# Patient Record
Sex: Female | Born: 1965
Health system: Southern US, Community
[De-identification: ages and names within clinical notes are randomized; demographics above are authoritative.]

## PROBLEM LIST (undated history)

## (undated) DIAGNOSIS — M1990 Unspecified osteoarthritis, unspecified site: Secondary | ICD-10-CM

## (undated) DIAGNOSIS — I2510 Atherosclerotic heart disease of native coronary artery without angina pectoris: Secondary | ICD-10-CM

## (undated) DIAGNOSIS — I219 Acute myocardial infarction, unspecified: Secondary | ICD-10-CM

## (undated) DIAGNOSIS — W19XXXA Unspecified fall, initial encounter: Secondary | ICD-10-CM

## (undated) DIAGNOSIS — K219 Gastro-esophageal reflux disease without esophagitis: Secondary | ICD-10-CM

## (undated) DIAGNOSIS — D1720 Benign lipomatous neoplasm of skin and subcutaneous tissue of unspecified limb: Secondary | ICD-10-CM

## (undated) DIAGNOSIS — R768 Other specified abnormal immunological findings in serum: Secondary | ICD-10-CM

## (undated) DIAGNOSIS — I10 Essential (primary) hypertension: Secondary | ICD-10-CM

## (undated) DIAGNOSIS — M797 Fibromyalgia: Secondary | ICD-10-CM

## (undated) HISTORY — PX: CARPAL TUNNEL RELEASE: SHX101

## (undated) HISTORY — DX: Other specified abnormal immunological findings in serum: R76.8

## (undated) HISTORY — DX: Unspecified fall, initial encounter: W19.XXXA

## (undated) HISTORY — PX: BREAST BIOPSY: SHX20

## (undated) HISTORY — PX: COLONOSCOPY: SHX174

## (undated) HISTORY — DX: Acute myocardial infarction, unspecified: I21.9

## (undated) HISTORY — DX: Fibromyalgia: M79.7

## (undated) HISTORY — PX: FOOT SURGERY: SHX648

## (undated) HISTORY — PX: ANKLE FRACTURE SURGERY: SHX122

## (undated) HISTORY — DX: Atherosclerotic heart disease of native coronary artery without angina pectoris: I25.10

---

## 1993-07-01 HISTORY — PX: SPINE SURGERY: SHX786

## 2002-03-09 ENCOUNTER — Encounter: Payer: Self-pay | Admitting: Internal Medicine

## 2003-06-03 ENCOUNTER — Other Ambulatory Visit: Admission: RE | Admit: 2003-06-03 | Discharge: 2003-06-03 | Payer: Self-pay | Admitting: Internal Medicine

## 2003-08-05 ENCOUNTER — Encounter: Admission: RE | Admit: 2003-08-05 | Discharge: 2003-08-05 | Payer: Self-pay | Admitting: Internal Medicine

## 2004-05-10 ENCOUNTER — Encounter: Payer: Self-pay | Admitting: Internal Medicine

## 2004-05-31 ENCOUNTER — Encounter: Payer: Self-pay | Admitting: Internal Medicine

## 2004-06-11 ENCOUNTER — Ambulatory Visit: Payer: Self-pay | Admitting: Internal Medicine

## 2004-07-30 ENCOUNTER — Ambulatory Visit: Payer: Self-pay | Admitting: Internal Medicine

## 2004-08-20 ENCOUNTER — Encounter: Admission: RE | Admit: 2004-08-20 | Discharge: 2004-08-20 | Payer: Self-pay | Admitting: Internal Medicine

## 2004-08-30 ENCOUNTER — Encounter: Admission: RE | Admit: 2004-08-30 | Discharge: 2004-08-30 | Payer: Self-pay | Admitting: Internal Medicine

## 2005-02-01 ENCOUNTER — Ambulatory Visit: Payer: Self-pay | Admitting: Internal Medicine

## 2005-02-01 ENCOUNTER — Other Ambulatory Visit: Admission: RE | Admit: 2005-02-01 | Discharge: 2005-02-01 | Payer: Self-pay | Admitting: Internal Medicine

## 2005-04-23 ENCOUNTER — Ambulatory Visit: Payer: Self-pay | Admitting: Internal Medicine

## 2005-04-29 ENCOUNTER — Ambulatory Visit: Payer: Self-pay | Admitting: Internal Medicine

## 2005-08-26 ENCOUNTER — Encounter: Admission: RE | Admit: 2005-08-26 | Discharge: 2005-08-26 | Payer: Self-pay | Admitting: Internal Medicine

## 2005-10-24 ENCOUNTER — Ambulatory Visit: Payer: Self-pay | Admitting: Internal Medicine

## 2006-04-21 ENCOUNTER — Ambulatory Visit: Payer: Self-pay | Admitting: Internal Medicine

## 2006-05-12 ENCOUNTER — Ambulatory Visit: Payer: Self-pay | Admitting: Internal Medicine

## 2006-06-16 ENCOUNTER — Ambulatory Visit: Payer: Self-pay | Admitting: Internal Medicine

## 2006-08-28 ENCOUNTER — Encounter: Admission: RE | Admit: 2006-08-28 | Discharge: 2006-08-28 | Payer: Self-pay | Admitting: Internal Medicine

## 2006-09-05 ENCOUNTER — Ambulatory Visit: Payer: Self-pay | Admitting: Internal Medicine

## 2006-09-05 ENCOUNTER — Encounter: Payer: Self-pay | Admitting: Internal Medicine

## 2006-09-05 ENCOUNTER — Other Ambulatory Visit: Admission: RE | Admit: 2006-09-05 | Discharge: 2006-09-05 | Payer: Self-pay | Admitting: Internal Medicine

## 2006-09-05 LAB — CONVERTED CEMR LAB
ALT: 18 units/L (ref 0–40)
AST: 20 units/L (ref 0–37)
Albumin: 3.9 g/dL (ref 3.5–5.2)
Alkaline Phosphatase: 51 units/L (ref 39–117)
BUN: 9 mg/dL (ref 6–23)
Basophils Absolute: 0 10*3/uL (ref 0.0–0.1)
Bilirubin, Direct: 0.1 mg/dL (ref 0.0–0.3)
Cholesterol: 165 mg/dL (ref 0–200)
Creatinine, Ser: 0.7 mg/dL (ref 0.4–1.2)
Eosinophils Absolute: 0.2 10*3/uL (ref 0.0–0.6)
Eosinophils Relative: 3.5 % (ref 0.0–5.0)
HDL: 68.3 mg/dL (ref >39.0)
LDL Cholesterol: 90 mg/dL (ref 0–99)
Lymphocytes Relative: 22.5 % (ref 12.0–46.0)
MCHC: 34.7 g/dL (ref 30.0–36.0)
Monocytes Absolute: 0.3 10*3/uL (ref 0.2–0.7)
Neutro Abs: 3.7 10*3/uL (ref 1.4–7.7)
Neutrophils Relative %: 66.9 % (ref 43.0–77.0)
Potassium: 4.1 meq/L (ref 3.5–5.1)
RDW: 12.1 % (ref 11.5–14.6)
TSH: 1.5 microintl units/mL (ref 0.35–5.50)
Total Bilirubin: 0.8 mg/dL (ref 0.3–1.2)
Total CHOL/HDL Ratio: 2.4
Total Protein: 6.8 g/dL (ref 6.0–8.3)
Triglycerides: 34 mg/dL (ref 0–149)
VLDL: 7 mg/dL (ref 0–40)

## 2006-09-09 ENCOUNTER — Ambulatory Visit: Payer: Self-pay | Admitting: Internal Medicine

## 2006-11-17 ENCOUNTER — Ambulatory Visit: Payer: Self-pay | Admitting: Internal Medicine

## 2006-12-08 ENCOUNTER — Ambulatory Visit: Payer: Self-pay | Admitting: Internal Medicine

## 2007-01-21 ENCOUNTER — Ambulatory Visit: Payer: Self-pay | Admitting: Internal Medicine

## 2007-01-21 DIAGNOSIS — T50995A Adverse effect of other drugs, medicaments and biological substances, initial encounter: Secondary | ICD-10-CM | POA: Insufficient documentation

## 2007-01-22 ENCOUNTER — Telehealth: Payer: Self-pay | Admitting: Internal Medicine

## 2007-01-23 ENCOUNTER — Encounter: Payer: Self-pay | Admitting: Internal Medicine

## 2007-01-30 ENCOUNTER — Telehealth: Payer: Self-pay | Admitting: Internal Medicine

## 2007-02-16 ENCOUNTER — Encounter: Payer: Self-pay | Admitting: Internal Medicine

## 2007-02-18 ENCOUNTER — Ambulatory Visit: Payer: Self-pay | Admitting: Internal Medicine

## 2007-02-18 DIAGNOSIS — M2550 Pain in unspecified joint: Secondary | ICD-10-CM

## 2007-02-18 DIAGNOSIS — IMO0001 Reserved for inherently not codable concepts without codable children: Secondary | ICD-10-CM

## 2007-02-19 ENCOUNTER — Emergency Department (HOSPITAL_COMMUNITY): Admission: EM | Admit: 2007-02-19 | Discharge: 2007-02-19 | Payer: Self-pay | Admitting: Emergency Medicine

## 2007-02-19 ENCOUNTER — Telehealth: Payer: Self-pay | Admitting: Internal Medicine

## 2007-02-20 ENCOUNTER — Telehealth: Payer: Self-pay | Admitting: Internal Medicine

## 2007-02-24 ENCOUNTER — Ambulatory Visit: Payer: Self-pay | Admitting: Internal Medicine

## 2007-02-24 DIAGNOSIS — R51 Headache: Secondary | ICD-10-CM

## 2007-02-24 DIAGNOSIS — I10 Essential (primary) hypertension: Secondary | ICD-10-CM

## 2007-02-24 DIAGNOSIS — R519 Headache, unspecified: Secondary | ICD-10-CM | POA: Insufficient documentation

## 2007-02-27 ENCOUNTER — Ambulatory Visit: Payer: Self-pay | Admitting: Internal Medicine

## 2007-03-16 ENCOUNTER — Ambulatory Visit: Payer: Self-pay | Admitting: Internal Medicine

## 2007-03-30 ENCOUNTER — Encounter: Payer: Self-pay | Admitting: Internal Medicine

## 2007-05-13 ENCOUNTER — Telehealth: Payer: Self-pay | Admitting: *Deleted

## 2007-05-13 ENCOUNTER — Ambulatory Visit: Payer: Self-pay | Admitting: Internal Medicine

## 2007-05-19 ENCOUNTER — Telehealth: Payer: Self-pay | Admitting: Internal Medicine

## 2007-07-01 ENCOUNTER — Telehealth: Payer: Self-pay | Admitting: *Deleted

## 2007-07-13 ENCOUNTER — Ambulatory Visit: Payer: Self-pay | Admitting: Internal Medicine

## 2007-08-25 ENCOUNTER — Ambulatory Visit: Payer: Self-pay | Admitting: Internal Medicine

## 2007-09-01 ENCOUNTER — Encounter: Admission: RE | Admit: 2007-09-01 | Discharge: 2007-09-01 | Payer: Self-pay | Admitting: Internal Medicine

## 2007-09-29 ENCOUNTER — Ambulatory Visit: Payer: Self-pay | Admitting: Internal Medicine

## 2007-09-30 ENCOUNTER — Telehealth: Payer: Self-pay | Admitting: Internal Medicine

## 2007-10-02 ENCOUNTER — Telehealth: Payer: Self-pay | Admitting: Internal Medicine

## 2007-10-07 ENCOUNTER — Ambulatory Visit: Payer: Self-pay | Admitting: Internal Medicine

## 2007-10-07 LAB — CONVERTED CEMR LAB
Bilirubin Urine: NEGATIVE
CRP, High Sensitivity: 4.1 — ABNORMAL HIGH
Glucose, Urine, Semiquant: NEGATIVE
Ketones, urine, test strip: NEGATIVE
Protein, U semiquant: NEGATIVE
pH: 7

## 2007-10-08 ENCOUNTER — Encounter: Payer: Self-pay | Admitting: Internal Medicine

## 2007-10-08 LAB — CONVERTED CEMR LAB: Vit D, 1,25-Dihydroxy: 36 (ref 30–89)

## 2007-10-19 ENCOUNTER — Ambulatory Visit: Payer: Self-pay | Admitting: Internal Medicine

## 2007-10-19 DIAGNOSIS — F4323 Adjustment disorder with mixed anxiety and depressed mood: Secondary | ICD-10-CM | POA: Insufficient documentation

## 2007-10-19 LAB — CONVERTED CEMR LAB
Albumin: 3.9 g/dL (ref 3.5–5.2)
Basophils Absolute: 0.1 10*3/uL (ref 0.0–0.1)
Bilirubin, Direct: 0.2 mg/dL (ref 0.0–0.3)
CO2: 29 meq/L (ref 19–32)
Calcium: 9.4 mg/dL (ref 8.4–10.5)
Creatinine, Ser: 0.9 mg/dL (ref 0.4–1.2)
Eosinophils Absolute: 0.2 10*3/uL (ref 0.0–0.7)
GFR calc Af Amer: 88 mL/min
GFR calc non Af Amer: 73 mL/min
Hemoglobin: 14 g/dL (ref 12.0–15.0)
LDL Cholesterol: 113 mg/dL — ABNORMAL HIGH (ref 0–99)
Lymphocytes Relative: 26.6 % (ref 12.0–46.0)
MCV: 98.8 fL (ref 78.0–100.0)
Monocytes Absolute: 0.3 10*3/uL (ref 0.1–1.0)
Monocytes Relative: 5.9 % (ref 3.0–12.0)
Neutrophils Relative %: 63.1 % (ref 43.0–77.0)
RBC: 4.13 M/uL (ref 3.87–5.11)
RDW: 11.9 % (ref 11.5–14.6)
Sodium: 142 meq/L (ref 135–145)
WBC: 5.9 10*3/uL (ref 4.5–10.5)

## 2007-11-13 ENCOUNTER — Encounter: Payer: Self-pay | Admitting: Internal Medicine

## 2007-11-16 ENCOUNTER — Ambulatory Visit: Payer: Self-pay | Admitting: Internal Medicine

## 2007-11-16 LAB — CONVERTED CEMR LAB
Bilirubin Urine: NEGATIVE
Nitrite: NEGATIVE
Protein, U semiquant: NEGATIVE
Specific Gravity, Urine: 1.015
pH: 5

## 2007-11-17 ENCOUNTER — Ambulatory Visit: Payer: Self-pay | Admitting: Internal Medicine

## 2007-11-17 ENCOUNTER — Encounter: Payer: Self-pay | Admitting: Internal Medicine

## 2007-11-25 ENCOUNTER — Telehealth: Payer: Self-pay | Admitting: Internal Medicine

## 2007-12-07 ENCOUNTER — Ambulatory Visit: Payer: Self-pay | Admitting: Internal Medicine

## 2007-12-07 LAB — CONVERTED CEMR LAB
Bilirubin Urine: NEGATIVE
Ketones, urine, test strip: NEGATIVE
Protein, U semiquant: NEGATIVE
Specific Gravity, Urine: <1.005
Urobilinogen, UA: 0.2

## 2007-12-08 ENCOUNTER — Encounter: Payer: Self-pay | Admitting: Internal Medicine

## 2007-12-22 ENCOUNTER — Telehealth: Payer: Self-pay | Admitting: Internal Medicine

## 2008-01-15 ENCOUNTER — Encounter: Payer: Self-pay | Admitting: Internal Medicine

## 2008-01-20 ENCOUNTER — Ambulatory Visit: Payer: Self-pay | Admitting: Internal Medicine

## 2008-01-20 ENCOUNTER — Telehealth: Payer: Self-pay | Admitting: Internal Medicine

## 2008-01-20 LAB — CONVERTED CEMR LAB
Bilirubin Urine: NEGATIVE
Ketones, urine, test strip: NEGATIVE
Nitrite: NEGATIVE
Protein, U semiquant: NEGATIVE
pH: 5.5

## 2008-01-21 ENCOUNTER — Encounter: Payer: Self-pay | Admitting: Internal Medicine

## 2008-02-29 ENCOUNTER — Ambulatory Visit: Payer: Self-pay | Admitting: Internal Medicine

## 2008-03-08 ENCOUNTER — Telehealth: Payer: Self-pay | Admitting: Internal Medicine

## 2008-03-16 ENCOUNTER — Encounter: Payer: Self-pay | Admitting: Internal Medicine

## 2008-03-18 ENCOUNTER — Encounter: Admission: RE | Admit: 2008-03-18 | Discharge: 2008-03-18 | Payer: Self-pay | Admitting: Radiation Oncology

## 2008-04-13 ENCOUNTER — Telehealth: Payer: Self-pay | Admitting: Family Medicine

## 2008-04-25 ENCOUNTER — Encounter: Payer: Self-pay | Admitting: Internal Medicine

## 2008-05-10 ENCOUNTER — Ambulatory Visit: Payer: Self-pay | Admitting: Internal Medicine

## 2008-06-08 ENCOUNTER — Encounter: Payer: Self-pay | Admitting: Internal Medicine

## 2008-06-08 ENCOUNTER — Other Ambulatory Visit: Admission: RE | Admit: 2008-06-08 | Discharge: 2008-06-08 | Payer: Self-pay | Admitting: Internal Medicine

## 2008-06-08 ENCOUNTER — Ambulatory Visit: Payer: Self-pay | Admitting: Internal Medicine

## 2008-06-29 HISTORY — PX: OTHER SURGICAL HISTORY: SHX169

## 2008-07-29 ENCOUNTER — Ambulatory Visit: Payer: Self-pay | Admitting: Internal Medicine

## 2008-07-29 DIAGNOSIS — IMO0002 Reserved for concepts with insufficient information to code with codable children: Secondary | ICD-10-CM

## 2008-09-01 ENCOUNTER — Encounter: Admission: RE | Admit: 2008-09-01 | Discharge: 2008-09-01 | Payer: Self-pay | Admitting: Internal Medicine

## 2008-09-16 ENCOUNTER — Ambulatory Visit: Payer: Self-pay | Admitting: Internal Medicine

## 2008-10-17 ENCOUNTER — Encounter: Payer: Self-pay | Admitting: Internal Medicine

## 2008-10-26 ENCOUNTER — Telehealth: Payer: Self-pay | Admitting: *Deleted

## 2008-12-12 ENCOUNTER — Encounter: Payer: Self-pay | Admitting: Internal Medicine

## 2008-12-13 ENCOUNTER — Ambulatory Visit (HOSPITAL_BASED_OUTPATIENT_CLINIC_OR_DEPARTMENT_OTHER): Admission: RE | Admit: 2008-12-13 | Discharge: 2008-12-13 | Payer: Self-pay | Admitting: General Surgery

## 2008-12-13 ENCOUNTER — Encounter: Payer: Self-pay | Admitting: Internal Medicine

## 2008-12-13 ENCOUNTER — Encounter (INDEPENDENT_AMBULATORY_CARE_PROVIDER_SITE_OTHER): Payer: Self-pay | Admitting: General Surgery

## 2008-12-13 HISTORY — PX: LIPOMA EXCISION: SHX5283

## 2008-12-23 ENCOUNTER — Encounter: Payer: Self-pay | Admitting: Internal Medicine

## 2009-02-22 ENCOUNTER — Ambulatory Visit: Payer: Self-pay | Admitting: Internal Medicine

## 2009-02-22 DIAGNOSIS — K5900 Constipation, unspecified: Secondary | ICD-10-CM | POA: Insufficient documentation

## 2009-02-23 ENCOUNTER — Ambulatory Visit: Payer: Self-pay | Admitting: Internal Medicine

## 2009-02-27 LAB — CONVERTED CEMR LAB
ALT: 24 units/L (ref 0–35)
AST: 22 units/L (ref 0–37)
BUN: 12 mg/dL (ref 6–23)
CO2: 30 meq/L (ref 19–32)
Calcium: 9.2 mg/dL (ref 8.4–10.5)
Chloride: 105 meq/L (ref 96–112)
Creatinine, Ser: 1 mg/dL (ref 0.4–1.2)
HDL: 51.4 mg/dL (ref >39.00)
Potassium: 4.3 meq/L (ref 3.5–5.1)
Total Protein: 7.3 g/dL (ref 6.0–8.3)

## 2009-03-22 ENCOUNTER — Telehealth: Payer: Self-pay | Admitting: *Deleted

## 2009-04-25 ENCOUNTER — Ambulatory Visit: Payer: Self-pay | Admitting: Internal Medicine

## 2009-04-25 DIAGNOSIS — Z87891 Personal history of nicotine dependence: Secondary | ICD-10-CM

## 2009-05-15 ENCOUNTER — Telehealth: Payer: Self-pay | Admitting: *Deleted

## 2009-06-19 ENCOUNTER — Ambulatory Visit: Payer: Self-pay | Admitting: Internal Medicine

## 2009-06-21 ENCOUNTER — Telehealth: Payer: Self-pay | Admitting: Internal Medicine

## 2009-06-26 ENCOUNTER — Ambulatory Visit: Payer: Self-pay | Admitting: Internal Medicine

## 2009-07-07 ENCOUNTER — Ambulatory Visit: Payer: Self-pay | Admitting: Internal Medicine

## 2009-07-07 ENCOUNTER — Encounter: Payer: Self-pay | Admitting: Internal Medicine

## 2009-07-07 DIAGNOSIS — R229 Localized swelling, mass and lump, unspecified: Secondary | ICD-10-CM

## 2009-07-07 LAB — CONVERTED CEMR LAB
AST: 33 units/L (ref 0–37)
Alkaline Phosphatase: 82 units/L (ref 39–117)
Basophils Relative: 0.3 % (ref 0.0–3.0)
Eosinophils Relative: 1.1 % (ref 0.0–5.0)
Lymphocytes Relative: 15.6 % (ref 12.0–46.0)
Lymphs Abs: 1.2 10*3/uL (ref 0.7–4.0)
Mono Screen: NEGATIVE
Monocytes Absolute: 0.4 10*3/uL (ref 0.1–1.0)
Monocytes Relative: 5.2 % (ref 3.0–12.0)
Neutro Abs: 6 10*3/uL (ref 1.4–7.7)
RBC: 4.06 M/uL (ref 3.87–5.11)
RDW: 12.1 % (ref 11.5–14.6)
Sed Rate: 25 mm/hr — ABNORMAL HIGH (ref 0–22)
Total Bilirubin: 0.7 mg/dL (ref 0.3–1.2)

## 2009-07-10 ENCOUNTER — Telehealth: Payer: Self-pay | Admitting: *Deleted

## 2009-07-12 ENCOUNTER — Telehealth: Payer: Self-pay | Admitting: *Deleted

## 2009-07-12 LAB — CONVERTED CEMR LAB: EBV VCA IgM: 0.37

## 2009-07-13 ENCOUNTER — Encounter: Admission: RE | Admit: 2009-07-13 | Discharge: 2009-07-13 | Payer: Self-pay | Admitting: Internal Medicine

## 2009-08-01 ENCOUNTER — Telehealth: Payer: Self-pay | Admitting: *Deleted

## 2009-09-01 ENCOUNTER — Encounter: Admission: RE | Admit: 2009-09-01 | Discharge: 2009-09-01 | Payer: Self-pay | Admitting: Internal Medicine

## 2009-09-25 ENCOUNTER — Ambulatory Visit: Payer: Self-pay | Admitting: Internal Medicine

## 2009-10-24 ENCOUNTER — Encounter: Payer: Self-pay | Admitting: Internal Medicine

## 2009-10-25 ENCOUNTER — Encounter: Admission: RE | Admit: 2009-10-25 | Discharge: 2009-10-25 | Payer: Self-pay | Admitting: General Surgery

## 2009-11-09 ENCOUNTER — Encounter: Payer: Self-pay | Admitting: Internal Medicine

## 2009-11-16 ENCOUNTER — Encounter: Admission: RE | Admit: 2009-11-16 | Discharge: 2009-11-16 | Payer: Self-pay | Admitting: General Surgery

## 2009-11-22 ENCOUNTER — Encounter: Admission: RE | Admit: 2009-11-22 | Discharge: 2009-11-22 | Payer: Self-pay | Admitting: General Surgery

## 2009-11-29 ENCOUNTER — Encounter: Admission: RE | Admit: 2009-11-29 | Discharge: 2009-11-29 | Payer: Self-pay | Admitting: General Surgery

## 2009-12-18 ENCOUNTER — Encounter: Payer: Self-pay | Admitting: Internal Medicine

## 2009-12-18 ENCOUNTER — Telehealth: Payer: Self-pay | Admitting: *Deleted

## 2010-01-17 ENCOUNTER — Telehealth: Payer: Self-pay | Admitting: Internal Medicine

## 2010-03-27 ENCOUNTER — Telehealth: Payer: Self-pay | Admitting: *Deleted

## 2010-04-24 ENCOUNTER — Ambulatory Visit: Payer: Self-pay | Admitting: Internal Medicine

## 2010-04-26 LAB — CONVERTED CEMR LAB
ALT: 19 units/L (ref 0–35)
Albumin: 4 g/dL (ref 3.5–5.2)
Alkaline Phosphatase: 104 units/L (ref 39–117)
BUN: 15 mg/dL (ref 6–23)
Creatinine, Ser: 0.9 mg/dL (ref 0.4–1.2)
GFR calc non Af Amer: 76.37 mL/min (ref >60)
Glucose, Bld: 91 mg/dL (ref 70–99)
Sodium: 139 meq/L (ref 135–145)

## 2010-05-14 ENCOUNTER — Ambulatory Visit: Payer: Self-pay | Admitting: Internal Medicine

## 2010-05-14 DIAGNOSIS — M62838 Other muscle spasm: Secondary | ICD-10-CM | POA: Insufficient documentation

## 2010-05-15 ENCOUNTER — Telehealth: Payer: Self-pay | Admitting: Internal Medicine

## 2010-05-31 ENCOUNTER — Ambulatory Visit: Payer: Self-pay | Admitting: Internal Medicine

## 2010-05-31 DIAGNOSIS — N390 Urinary tract infection, site not specified: Secondary | ICD-10-CM

## 2010-05-31 DIAGNOSIS — N949 Unspecified condition associated with female genital organs and menstrual cycle: Secondary | ICD-10-CM

## 2010-05-31 DIAGNOSIS — R21 Rash and other nonspecific skin eruption: Secondary | ICD-10-CM

## 2010-05-31 HISTORY — PX: BREAST SURGERY: SHX581

## 2010-05-31 LAB — CONVERTED CEMR LAB
Beta hcg, urine, semiquantitative: NEGATIVE
Nitrite: POSITIVE
Protein, U semiquant: NEGATIVE
Specific Gravity, Urine: 1.015
Urobilinogen, UA: 0.2
WBC Urine, dipstick: NEGATIVE
pH: 6

## 2010-06-19 ENCOUNTER — Encounter
Admission: RE | Admit: 2010-06-19 | Discharge: 2010-06-19 | Payer: Self-pay | Source: Home / Self Care | Attending: General Surgery | Admitting: General Surgery

## 2010-07-01 DIAGNOSIS — I219 Acute myocardial infarction, unspecified: Secondary | ICD-10-CM

## 2010-07-01 HISTORY — DX: Acute myocardial infarction, unspecified: I21.9

## 2010-07-03 ENCOUNTER — Telehealth: Payer: Self-pay | Admitting: Internal Medicine

## 2010-07-16 ENCOUNTER — Other Ambulatory Visit
Admission: RE | Admit: 2010-07-16 | Discharge: 2010-07-16 | Payer: Self-pay | Source: Home / Self Care | Admitting: Internal Medicine

## 2010-07-16 ENCOUNTER — Other Ambulatory Visit: Payer: Self-pay | Admitting: Internal Medicine

## 2010-07-16 ENCOUNTER — Ambulatory Visit
Admission: RE | Admit: 2010-07-16 | Discharge: 2010-07-16 | Payer: Self-pay | Source: Home / Self Care | Attending: Internal Medicine | Admitting: Internal Medicine

## 2010-07-16 DIAGNOSIS — K6289 Other specified diseases of anus and rectum: Secondary | ICD-10-CM | POA: Insufficient documentation

## 2010-07-16 DIAGNOSIS — R109 Unspecified abdominal pain: Secondary | ICD-10-CM | POA: Insufficient documentation

## 2010-07-16 LAB — HM PAP SMEAR

## 2010-07-16 LAB — CONVERTED CEMR LAB
Nitrite: NEGATIVE
Protein, U semiquant: NEGATIVE
WBC Urine, dipstick: NEGATIVE

## 2010-07-20 ENCOUNTER — Encounter: Payer: Self-pay | Admitting: Gastroenterology

## 2010-07-20 ENCOUNTER — Encounter
Admission: RE | Admit: 2010-07-20 | Discharge: 2010-07-20 | Payer: Self-pay | Source: Home / Self Care | Attending: Internal Medicine | Admitting: Internal Medicine

## 2010-07-20 ENCOUNTER — Ambulatory Visit
Admission: RE | Admit: 2010-07-20 | Discharge: 2010-07-20 | Payer: Self-pay | Source: Home / Self Care | Attending: Gastroenterology | Admitting: Gastroenterology

## 2010-07-22 ENCOUNTER — Encounter: Payer: Self-pay | Admitting: General Surgery

## 2010-07-23 ENCOUNTER — Telehealth: Payer: Self-pay | Admitting: *Deleted

## 2010-07-24 ENCOUNTER — Ambulatory Visit: Admit: 2010-07-24 | Payer: Self-pay | Admitting: Internal Medicine

## 2010-07-31 ENCOUNTER — Ambulatory Visit
Admission: RE | Admit: 2010-07-31 | Discharge: 2010-07-31 | Payer: Self-pay | Source: Home / Self Care | Attending: Internal Medicine | Admitting: Internal Medicine

## 2010-07-31 ENCOUNTER — Other Ambulatory Visit: Payer: Self-pay | Admitting: Internal Medicine

## 2010-07-31 LAB — TSH: TSH: 1.58 u[IU]/mL (ref 0.35–5.50)

## 2010-07-31 LAB — T3, FREE: T3, Free: 2.4 pg/mL (ref 2.3–4.2)

## 2010-07-31 LAB — T4, FREE: Free T4: 0.63 ng/dL (ref 0.60–1.60)

## 2010-07-31 NOTE — Progress Notes (Signed)
Summary: refill  Phone Note From Pharmacy   Caller: [facility] Reason for Call: Needs renewal Details for Reason: metoprolol Initial call taken by: Romualdo Bolk, CMA Duncan Dull),  March 27, 2010 4:58 PM  Follow-up for Phone Call        Pt needs to schedule a follow up appt before next refill. Follow-up by: Romualdo Bolk, CMA Duncan Dull),  March 27, 2010 4:59 PM  Additional Follow-up for Phone Call Additional follow up Details #1::        Left message that pt needs a follow up rov before next refill. Additional Follow-up by: Romualdo Bolk, CMA (AAMA),  March 28, 2010 12:09 PM    Prescriptions: METOPROLOL SUCCINATE 100 MG  TB24 (METOPROLOL SUCCINATE) 1/2  by mouth once daily or as directed  #90 x 0   Entered by:   Romualdo Bolk, CMA (AAMA)   Authorized by:   Madelin Headings MD   Signed by:   Romualdo Bolk, CMA (AAMA) on 03/27/2010   Method used:   Faxed to ...       [facility]       [address] [place], [place]  [address]       Ph: [phone]       Fax: [phone]   RxID:   [id number]

## 2010-07-31 NOTE — Consult Note (Signed)
Summary: [facility]   Imported By: Maryln Gottron 11/22/2009 11:07:31  _____________________________________________________________________  External Attachment:    Type:   Image     Comment:   External Document

## 2010-07-31 NOTE — Therapy (Signed)
Summary: [facility]   Imported By: Maryln Gottron 07/12/2009 15:21:46  _____________________________________________________________________  External Attachment:    Type:   Image     Comment:   External Document

## 2010-07-31 NOTE — Assessment & Plan Note (Signed)
Summary: enlarge growth under arm pit/njr   Vital Signs:  Patient profile:   45 year old female Menstrual status:  regular Weight:      165 pounds Temp:     98.2 degrees F oral BP sitting:   110 / 78  Vitals Entered By: Lynann Beaver CMA (September 25, 2009 2:07 PM) CC: check mass under right arm Is Patient Diabetic? No Pain Assessment Patient in pain? no        History of Present Illness: Margaret Howard Date  comes in today for sda because  the lumpyness in her left axilla is larger  over the last few months  and now  somewhat tender  . It makes it harder to fit her bra.  and she  Is  concerned about this.    CTS surgery   left in february   to have follow up April.  Denies problems with surgery or other  infection. no fever sweats or other lumps except theliponmas on her arms.   Back to work and other wise doing well .   Preventive Screening-Counseling & Management  Alcohol-Tobacco     Alcohol drinks/day: socially     Smoking Status: quit     Year Quit: 24 year ago  Caffeine-Diet-Exercise     Caffeine use/day: 3     Does Patient Exercise: no     Type of exercise: walk     Times/week: 7  Current Medications (verified): 1)  Metoprolol Succinate 100 Mg  Tb24 (Metoprolol Succinate) .... 1/2  By Mouth Once Daily or As Directed 2)  Relpax 40 Mg  Tabs (Eletriptan Hydrobromide) .Marland Kitchen.. 1 By Mouth As Needed Headache May Repeat in 2 Hours 3)  Cymbalta 30 Mg Cpep (Duloxetine Hcl) .Marland Kitchen.. 1 By Mouth Once Daily 4)  Co-Gesic 5-500 Mg  Tabs (Hydrocodone-Acetaminophen) .Marland Kitchen.. 1 By Mouth Q4-6 Hours As Needed Headache 5)  Wellbutrin Xl 300 Mg Xr24h-Tab (Bupropion Hcl) .Marland Kitchen.. 1 By Mouth Once Daily 6)  Naprosyn 500 Mg Tabs (Naproxen) .Marland Kitchen.. 1 By Mouth Two Times A Day  For Back Pain 7)  Flexeril 10 Mg Tabs (Cyclobenzaprine Hcl) .Marland Kitchen.. 1 By Mouth Three Times A Day 8)  Hydrocodone-Homatropine 5-1.5 Mg/48ml Syrp (Hydrocodone-Homatropine) .Marland Kitchen.. 1-2 Teaspoons Q 6 Hrs As Needed Cough. 9)  Cymbalta 60 Mg Cpep  (Duloxetine Hcl) .Marland Kitchen.. 1 By Mouth Once Daily  Allergies (verified): No Known Drug Allergies  Past History:  Past medical, surgical, family and social histories (including risk factors) reviewed, and no changes noted (except as noted below).  Past Medical History: Reviewed history from 07/29/2008 and no changes required. fibromyalgia arththralgias and positive ana  eval in the past per rheum(eye, neuro,rheum)  g3p3 fx foot 2004   HT         LAST Mammogram: 3/09 Pap: doing today  Td:  Colonscopy: n/a EKG: 02/24/07 LMP: 9/09 Dexa: 11/17/07  Consults Dr. Arlice Colt Dr. Melvyn Novas  Past Surgical History: Microdisecetomy and Laminectomy for back problems 1995 Rt Wrist surgery for carpal tunnel 06/29/08 Lf Foot- scar tissue removed 06/29/08  Lipoma removals left wrist CTS 2 2011  Family History: Reviewed history from 06/19/2009 and no changes required. Family History Hypertension in 70's no known sleep dx parents Melanoma ? both.   breast cancer mom  age 26s aunt cancer ovarian and colon cancer .Marland Kitchen   Father Oral Cancer    Social History: Reviewed history from 06/19/2009 and no changes required. 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 frog and a mouse.  [place]    Review of Systems  The patient denies anorexia, fever, weight loss, weight gain, vision loss, abnormal bleeding, angioedema, breast masses, and difficulty walking.         rst of GI GU no change   Physical Exam  General:  Well-developed,well-nourished,in no acute distress; alert,appropriate and cooperative throughout examination Head:  normocephalic and atraumatic.   Eyes:  vision grossly intact.   Neck:  No deformities, masses, or tenderness noted. Lungs:  normal respiratory effort and no intercostal retractions.   Abdomen:  soft, non-tender, no hepatomegaly, and no splenomegaly.   Msk:  wrap on left wrist  Pulses:  pulses intact without delay    Skin:  turgor normal, color normal, no ecchymoses, and no petechiae.   Cervical Nodes:  No lymphadenopathy noted Axillary Nodes:  left axilla definitey fuller than right  ? if mass effect   no redness mildly tender  skin entacked  Psych:  Oriented X3, normally interactive, good eye contact, not anxious appearing, and not depressed appearing.     Impression & Recommendations:  Problem # 1:  MASS, LEFT AXILLA (ICD-782.2)  ? Assessment Deteriorated  see [place] report from  January that showed one ln 2 cm ?   pt says  area is larger and now  more noticeable. I am not sure if this is a LN or other tissue accumulation. Will get surgery to opine on this area.     Orders: Surgical Referral (Surgery)  Complete Medication List: 1)  Metoprolol Succinate 100 Mg Tb24 (Metoprolol succinate) .... 1/2  by mouth once daily or as directed 2)  Relpax 40 Mg Tabs (Eletriptan hydrobromide) .Marland Kitchen.. 1 by mouth as needed headache may repeat in 2 hours 3)  Cymbalta 30 Mg Cpep (Duloxetine hcl) .Marland Kitchen.. 1 by mouth once daily 4)  Co-gesic 5-500 Mg Tabs (Hydrocodone-acetaminophen) .Marland Kitchen.. 1 by mouth q4-6 hours as needed headache 5)  Wellbutrin Xl 300 Mg Xr24h-tab (Bupropion hcl) .Marland Kitchen.. 1 by mouth once daily 6)  Naprosyn 500 Mg Tabs (Naproxen) .Marland Kitchen.. 1 by mouth two times a day  for back pain 7)  Flexeril 10 Mg Tabs (Cyclobenzaprine hcl) .Marland Kitchen.. 1 by mouth three times a day 8)  Hydrocodone-homatropine 5-1.5 Mg/4ml Syrp (Hydrocodone-homatropine) .Marland Kitchen.. 1-2 teaspoons q 6 hrs as needed cough. 9)  Cymbalta 60 Mg Cpep (Duloxetine hcl) .Marland Kitchen.. 1 by mouth once daily  Patient Instructions: 1)  will   do referral   about the lump in left arm pit  2)  will send copy of [place] report from January .

## 2010-07-31 NOTE — Progress Notes (Signed)
Summary: left meds at home  Phone Note Call from Patient Call back at [phone]   Caller: Patient Summary of Call: [place] the beach and [place] medication. She is wanting to know what would happen if she didn't take cymbalta, metoprolol and wellbutrin until she gets back on friday. Initial call taken by: Romualdo Bolk, CMA (AAMA),  January 17, 2010 1:45 PM  Follow-up for Phone Call        Per Dr. Fabian Sharp- Can get rebound with metoprolol and cymbalta. Should be okay to wait on wellbutrin. May feel bad off medication. Can call in a few days worth or a new rx.  Follow-up by: Romualdo Bolk, CMA Duncan Dull),  January 17, 2010 2:14 PM  Additional Follow-up for Phone Call Additional follow up Details #1::        Left message to call back. Additional Follow-up by: Romualdo Bolk, CMA Duncan Dull),  January 17, 2010 2:16 PM    Additional Follow-up for Phone Call Additional follow up Details #2::    LMTOCB Follow-up by: Romualdo Bolk, CMA Duncan Dull),  January 17, 2010 4:53 PM  Additional Follow-up for Phone Call Additional follow up Details #3:: Details for Additional Follow-up Action Taken: Pt called back and is in [facility]. So we went ahead and called in 5 days worth of both Cymbalta and [facility]oprolol [facility]. Additional Follow-up by: Romualdo Bolk, CMA Duncan Dull),  January 17, 2010 5:23 PM  Prescriptions: METOPROLOL SUCCINATE 100 MG  TB24 (METOPROLOL SUCCINATE) 1/2  by mouth once daily or as directed  #5 x 0   Entered by:   Romualdo Bolk, CMA (AAMA)   Authorized by:   Madelin Headings MD   Signed by:   Romualdo Bolk, CMA (AAMA) on 01/17/2010   Method used:   [facility]ronically [facility]        [id number]       [address]       [place], [place]  [address]       Ph: [phone]       Fax: [phone]   RxID:   [id number] CYMBALTA 60 MG CPEP (DULOXETINE HCL) 2 by mouth once daily  #10 x 0   Entered by:   Romualdo Bolk, CMA (AAMA)  Authorized by:   Madelin Headings MD   Signed by:   Romualdo Bolk, CMA (AAMA) on 01/17/2010   Method used:   Electronically to        [id number]       [address]       [place], [place]  [address]       Ph: [phone]       Fax: [phone]   RxID:   [id number]

## 2010-07-31 NOTE — Letter (Signed)
Summary: [facility]   Imported By: Maryln Gottron 04/12/2009 14:44:15  _____________________________________________________________________  External Attachment:    Type:   Image     Comment:   External Document

## 2010-07-31 NOTE — Assessment & Plan Note (Signed)
Summary: uti//ccm   Vital Signs:  Patient profile:   45 year old female Menstrual status:  regular LMP:     03/25/2010 Weight:      152 pounds Temp:     97.8 degrees F oral BP sitting:   130 / 84  (left arm) Cuff size:   regular  Vitals Entered By: Sid Falcon LPN (May 31, 2010 10:16 AM)  History of Present Illness: Margaret Howard comes in today    for acute visit for  dysuria   and frequency and  bad odor to her urine.  No fever flank pain. Check rash no better with antibiotic or fungal topical. LMP sept 25 10 weeks  husband with  vasectomy .  has pap appt here in January.   Allergies (verified): No Known Drug Allergies  Past History:  Past medical, surgical, family and social histories (including risk factors) reviewed for relevance to current acute and chronic problems.  Past Medical History: Reviewed history from 07/29/2008 and no changes required. fibromyalgia arththralgias and positive ana  eval in the past per rheum(eye, neuro,rheum)  g3p3 fx foot 2004   HT         LAST Mammogram: 3/09 Pap: doing today  Td:  Colonscopy: n/a EKG: 02/24/07 LMP: 9/09 Dexa: 11/17/07  Consults Dr. Arlice Colt Dr. Melvyn Novas  Past Surgical History: Reviewed history from 04/24/2010 and no changes required. Microdisecetomy and Laminectomy for back problems 1995 Rt Wrist surgery for carpal tunnel 06/29/08 Lf Foot- scar tissue removed 06/29/08  Lipoma removals left wrist CTS 2 /2011  Family History: Reviewed history from 06/19/2009 and no changes required. Family History Hypertension in 70's no known sleep dx parents Melanoma ? both.   breast cancer mom  age 31s aunt cancer ovarian and colon cancer .Marland Kitchen   Father Oral Cancer    Social History: Reviewed history from 04/24/2010 and no changes required. 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 frog and a mouse.  [place]   now has grandchild     daughter     married   Review of Systems  The patient denies anorexia, fever, weight loss, weight gain, abdominal pain, hematuria, and incontinence.    Physical Exam  General:  Well-developed,well-nourished,in no acute distress; alert,appropriate and cooperative throughout examination Abdomen:  no flank  pain  Skin:  right  upper arm 1-1.5 annular lsions pink  no scaling tenderess or pustules.  Cervical Nodes:  No lymphadenopathy noted Psych:  Oriented X3.     Impression & Recommendations:  Problem # 1:  UTI (ICD-599.0) seems classic and uncomplicated    empiric rx  Her updated medication list for this problem includes:    Macrobid 100 Mg Caps (Nitrofurantoin monohyd macro) .Marland Kitchen... 1 by mouth two times a day  for  uti  Problem # 2:  RASH (ICD-782.1) small annular rash right arm  no scaling  not responding to  antifungal after 3 weeks?     can add hcs  ? GA  Problem # 3:  DELAYED MENSES (ICD-626.8) neg ucg  possibly. peri Menopausal.  Complete Medication List: 1)  Metoprolol Succinate 100 Mg Tb24 (Metoprolol succinate) .... 1/2  by mouth once daily or as directed 2)  Cymbalta 30 Mg Cpep (Duloxetine hcl) .Marland Kitchen.. 1 by mouth once daily 3)  Cyclobenzaprine Hcl 10 Mg Tabs (Cyclobenzaprine hcl) .Marland Kitchen.. 1 by mouth three times a day as needed muscle  spasm. 4)  Hydrocodone-acetaminophen 5-325 Mg Tabs (Hydrocodone-acetaminophen) .Marland Kitchen.. 1 by mouth  every 4-6 hours as needed severe pain 5)  Prevacid 24hr 15 Mg Cpdr (Lansoprazole) .... Once daily 6)  Macrobid 100 Mg Caps (Nitrofurantoin monohyd macro) .Marland Kitchen.. 1 by mouth two times a day  for  uti  Patient Instructions: 1)  take antibioitc    2)  call if not  resolving  3)  add hydrocortisone  to rash two times a day for a few week  and see how  rash does. 4)  ? consider granuloma annulare  as diagnosis Prescriptions: MACROBID 100 MG CAPS (NITROFURANTOIN MONOHYD MACRO) 1 by mouth two times a day  for  UTI  #14 x 0   Entered and Authorized by:   Madelin Headings MD    Signed by:   Madelin Headings MD on 05/31/2010   Method used:   [facility]ronically to        [facility]  [phone][facility] (retail)       [address] [place], [place]  [address]       Ph: [phone] or [phone]       Fax: [phone]   RxID:   [id number]    Orders Added: 1)  Est. Patient Level III [[address]]    Laboratory Results   Urine Tests    Routine Urinalysis   Glucose: negative   (Normal Range: Negative) Bilirubin: negative   (Normal Range: Negative) Ketone: negative   (Normal Range: Negative) Spec. Gravity: 1.015   (Normal Range: 1.003-1.035) Blood: trace-lysed   (Normal Range: Negative) pH: 6.0   (Normal Range: 5.0-8.0) Protein: negative   (Normal Range: Negative) Urobilinogen: 0.2   (Normal Range: 0-1) Nitrite: positive   (Normal Range: Negative) Leukocyte Esterace: negative   (Normal Range: Negative)    Urine HCG: negative Comments: Sid Falcon LPN  May 31, 2010 10:58 AM

## 2010-07-31 NOTE — Progress Notes (Signed)
Summary: ? ring worm  Phone Note Outgoing Call Call back at [phone]   Call placed by: Romualdo Bolk, CMA Duncan Dull),  May 15, 2010 8:28 AM Call placed to: Patient Summary of Call: Left message to call back. Need more info. Initial call taken by: Romualdo Bolk, CMA (AAMA),  May 15, 2010 8:30 AM  Follow-up for Phone Call        Spoke to pt-She states that she has a red round and has a dark red on the outside and lighter on the inside. Size of a button. Raised up. Doesn't look like a bug bite. Pt states it has white bumps in the middle. No injury. Not infected. Doesn't hurt and not tender at all.  Pt is going to email [place] a picture of it so we can see what it looks like. Appt was cancelled for today. [facility] Follow-up by: Romualdo Bolk, CMA Duncan Dull),  May 15, 2010 11:20 AM  Additional Follow-up for Phone Call Additional follow up Details #1::        Per Dr. Fabian Sharp- would use a antibiotic onitment and antifungal medication. But if it starts spreading or gets pain then she needs to call us. Pt aware of this. Additional Follow-up by: Romualdo Bolk, CMA (AAMA),  May 15, 2010 1:46 PM

## 2010-07-31 NOTE — Letter (Signed)
Summary: [facility]   Imported By: Maryln Gottron 11/24/2009 15:20:30  _____________________________________________________________________  External Attachment:    Type:   Image     Comment:   External Document

## 2010-07-31 NOTE — Progress Notes (Signed)
Summary: refill on cymbalta  Phone Note From Pharmacy   Caller[facility] Reason for Call: Needs renewal Details for Reason: Cymbalta Initial call taken by: Romualdo Bolk, CMA Duncan Dull),  December 18, 2009 11:32 AM  Follow-up for Phone Call        Spoke to pt and she is taking 2 of the 60mg  for a total for 120mg  of Cymbalta. Rx sent to pharmacy. Follow-up by: Romualdo Bolk, CMA (AAMA),  December 18, 2009 11:37 AM    New/Updated Medications: CYMBALTA 60 MG CPEP (DULOXETINE HCL) 2 by mouth once daily Prescriptions: CYMBALTA 60 MG CPEP (DULOXETINE HCL) 2 by mouth once daily  #180 x 0   Entered by:   Romualdo Bolk, CMA (AAMA)   Authorized by:   Madelin Headings MD   Signed by:   Romualdo Bolk, CMA (AAMA) on 12/18/2009   Method used:   Faxed to ...       [facility] ([facility]order)       [address] [place], [place]  [address]       Ph: [phone]       Fax: [phone]   RxID:   [id number]

## 2010-07-31 NOTE — Progress Notes (Signed)
Summary: Pt ins changed to [facility] - Wellbutrin 300mg   Phone Note Call from Patient Call back at Home Phone [phone]   Caller: Patient Summary of Call: Pt called and has change insurance and has a new mail order pharmacy. Pt needs script for Wellbutrin 300mg  to [facility] Phone # [phone] Initial call taken by: Lucy Antigua,  August 01, 2009 8:58 AM  Follow-up for Phone Call        Rx sent to pharmacy. Follow-up by: Romualdo Bolk, CMA (AAMA),  August 01, 2009 9:14 AM    Prescriptions: WELLBUTRIN XL 300 MG XR24H-TAB (BUPROPION HCL) 1 by mouth once daily  #90 x 3   Entered by:   Romualdo Bolk, CMA (AAMA)   Authorized by:   Madelin Headings MD   Signed by:   Romualdo Bolk, CMA (AAMA) on 08/01/2009   Method used:   Faxed to ...       [facility]       [address] [place], [place]  [address]       Ph: [phone]       Fax: [phone]   RxID:   [id number]

## 2010-07-31 NOTE — Assessment & Plan Note (Signed)
Summary: shoulder pain/njr   Vital Signs:  Patient profile:   45 year old female Menstrual status:  regular Weight:      153 pounds Temp:     97.8 degrees F oral Pulse rate:   68 / minute BP sitting:   128 / 94  (left arm) Cuff size:   regular  Vitals Entered By: Alfred Levins, CMA (May 14, 2010 3:30 PM) CC: rt shoulder pain   History of Present Illness: Margaret Howard comes in today  for above  acute problem. 5 days ago  was planting  in yard    and hurt  right trapezius are  then lifted fish  tank at school and hurt again immediately  Used  Ice and cold  and 2 aleve. Stil hurting and radiating down arm.  no numbness > no weakness .   Preventive Screening-Counseling & Management  Alcohol-Tobacco     Alcohol drinks/day: socially     Smoking Status: quit     Year Quit: 24 year ago  Allergies: No Known Drug Allergies  Past History:  Past medical, surgical, family and social histories (including risk factors) reviewed, and no changes noted (except as noted below).  Past Medical History: Reviewed history from 07/29/2008 and no changes required. fibromyalgia arththralgias and positive ana  eval in the past per rheum(eye, neuro,rheum)  g3p3 fx foot 2004   HT         LAST Mammogram: 3/09 Pap: doing today  Td:  Colonscopy: n/a EKG: 02/24/07 LMP: 9/09 Dexa: 11/17/07  Consults Dr. Arlice Colt Dr. Melvyn Novas  Past Surgical History: Reviewed history from 04/24/2010 and no changes required. Microdisecetomy and Laminectomy for back problems 1995 Rt Wrist surgery for carpal tunnel 06/29/08 Lf Foot- scar tissue removed 06/29/08  Lipoma removals left wrist CTS 2 /2011  Family History: Reviewed history from 06/19/2009 and no changes required. Family History Hypertension in 70's no known sleep dx parents Melanoma ? both.   breast cancer mom  age 37s aunt cancer ovarian and colon cancer .Marland Kitchen   Father Oral Cancer    Social History: Reviewed history from 04/24/2010 and no  changes required. 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 frog and a mouse.  [place]   now has grandchild     daughter    married   Review of Systems  The patient denies anorexia, fever, chest pain, syncope, dyspnea on exertion, muscle weakness, transient blindness, and difficulty walking.    Physical Exam  General:  Well-developed,well-nourished,in no acute distress; alert,appropriate and cooperative throughout examination Head:  normocephalic and atraumatic.   Eyes:  vision grossly intact and pupils equal.   Neck:  tender  right  trapezius     with muscle spasm    Lungs:  Normal respiratory effort, chest expands symmetrically. Lungs are clear to auscultation, no crackles or wheezes. Heart:  normal rate, regular rhythm, no gallop, no rub, and no JVD.   Msk:  shoulder good rom  no acute joint swelling Pulses:  nl cparefill  Neurologic:  alert & oriented X3, cranial nerves II-XII intact, strength normal in all extremities, and DTRs symmetrical and normal.  grip 5/5  Skin:  turgor normal, color normal, no suspicious lesions, and no ecchymoses.   Cervical Nodes:  No lymphadenopathy noted Psych:  Oriented X3, normally interactive, good eye contact, not anxious appearing, and not depressed appearing.     Impression &  Recommendations:  Problem # 1:  MUSCLE SPASM, TRAPEZIUS MUSCLE, RIGHT (ICD-728.85) with some radiation  and has hx of hnp disease and surgery in back but no alarm features today.  trx conservatively and follow up if persistent or  progressive   check  bp readings  when pain down  Complete Medication List: 1)  Metoprolol Succinate 100 Mg Tb24 (Metoprolol succinate) .... 1/2  by mouth once daily or as directed 2)  Wellbutrin Xl 300 Mg Xr24h-tab (Bupropion hcl) .Marland Kitchen.. 1 by mouth once daily 3)  Cymbalta 30 Mg Cpep (Duloxetine hcl) .Marland Kitchen.. 1 by mouth once daily 4)  Wellbutrin Xl 150 Mg Xr24h-tab (Bupropion hcl) .Marland Kitchen..  1 by mouth once daily or as directed 5)  Cyclobenzaprine Hcl 10 Mg Tabs (Cyclobenzaprine hcl) .Marland Kitchen.. 1 by mouth three times a day as needed muscle spasm. 6)  Hydrocodone-acetaminophen 5-325 Mg Tabs (Hydrocodone-acetaminophen) .Marland Kitchen.. 1 by mouth  every 4-6 hours as needed severe pain  Patient Instructions: 1)  agree with 2 aleve two times a day for antiinflammatory  effect. 2)  muscle relaxant  at night  or as needed. 3)  Narcotic  for severe pain.  4)  expect improvement  within 2 weeks . 5)  call if persistent or  progressive  .  Prescriptions: HYDROCODONE-ACETAMINOPHEN 5-325 MG TABS (HYDROCODONE-ACETAMINOPHEN) 1 by mouth  every 4-6 hours as needed severe pain  #15 x 0   Entered and Authorized by:   Madelin Headings MD   Signed by:   Madelin Headings MD on 05/14/2010   Method used:   Print then Give to Patient   RxID:   [id number] CYCLOBENZAPRINE HCL 10 MG TABS (CYCLOBENZAPRINE HCL) 1 by mouth three times a day as needed muscle spasm.  #40 x 0   Entered and Authorized by:   Madelin Headings MD   Signed by:   Madelin Headings MD on 05/14/2010   Method used:   [facility]ronically to        [facility]  [phone][facility] ([facility]ail)       [address] [place], [place]  [address]       Ph: [phone] or [phone]       Fax: [phone]   RxID:   [id number]    Orders Added: 1)  Est. Patient Level III [875[address]]

## 2010-07-31 NOTE — Progress Notes (Signed)
Summary: lab/cxr results and refills on cymbalta  Phone Note Call from Patient Call back at [phone]   Caller: Patient Summary of Call: Pt is wanting lab results and cxr results. Pt also needs cymbalta 30mg  sent to a mail order pharmacy. I have left a message for pt to call with name of mail order company and if she needs both 30mg  and 60mg  cymbalta or just 30mg  cymbalta. Initial call taken by: Romualdo Bolk, CMA (AAMA),  July 10, 2009 12:05 PM  Follow-up for Phone Call        Recieved a fax from [facility] about refill and spoke to pt saying that she she only needs [facility]. She only needs Cymbalta 30mg . Follow-up by: Romualdo Bolk, CMA Duncan Dull),  July 10, 2009 4:17 PM  Additional Follow-up for Phone Call Additional follow up Details #1::        Tell patient that c x ray is normal   all labs not back yet .    she has had mono in the past . will report when results back Additional Follow-up by: Madelin Headings MD,  July 10, 2009 4:14 PM    Additional Follow-up for Phone Call Additional follow up Details #2::    Pt aware of results. Rx faxed to pharmacy. Follow-up by: Romualdo Bolk, CMA (AAMA),  July 10, 2009 4:19 PM  Prescriptions: CYMBALTA 30 MG CPEP (DULOXETINE HCL) 1 by mouth once daily  #90 x 3   Entered by:   Romualdo Bolk, CMA (AAMA)   Authorized by:   Madelin Headings MD   Signed by:   Romualdo Bolk, CMA (AAMA) on 07/10/2009   Method used:   Faxed to ...       [facility]       [address] [place], [place]  [address]       Ph: [phone]       Fax: [phone]   RxID:   [id number] CYMBALTA 30 MG CPEP (DULOXETINE HCL) 1 by mouth once daily  #90 x 3   Entered by:   Romualdo Bolk, CMA (AAMA)   Authorized by:   Madelin Headings MD   Signed by:   Romualdo Bolk, CMA (AAMA) on 07/10/2009   Method used:   Print then Give to Patient   RxID:   [id number]  Rx was not given to pt but faxed to pharmacy.   Romualdo Bolk, CMA (AAMA)  July 10, 2009 4:18 PM  Appended Document: lab/cxr results and refills on cymbalta sent to wrong pharmacy. Refaxed to [facility]. Romualdo Bolk, CMA (AAMA)  July 12, 2009 9:26 AM   Clinical Lists Changes  Medications: Rx of CYMBALTA 30 MG CPEP (DULOXETINE HCL) 1 by mouth once daily;  #90 x 3;  Signed;  Entered by: Romualdo Bolk, CMA (AAMA);  Authorized by: Madelin Headings MD;  Method used: [facility], [place], [place]  [address], Ph: [phone], Fax: [phone]    Prescriptions: CYMBALTA 30 MG CPEP (DULOXETINE HCL) 1 by mouth once daily  #90 x 3   Entered by:   Romualdo Bolk, CMA (AAMA)   Authorized by:   Madelin Headings MD   Signed by:   Romualdo Bolk, CMA (AAMA) on 07/12/2009   Method used:   Faxed to ...       [facility]  [address] [place], [place]  [address]       Ph: [phone]       Fax: [phone]   RxID:   [id number]

## 2010-07-31 NOTE — Letter (Signed)
Summary: [facility]   Imported By: Maryln Gottron 12/25/2009 15:51:04  _____________________________________________________________________  External Attachment:    Type:   Image     Comment:   External Document

## 2010-07-31 NOTE — Progress Notes (Signed)
Summary: needs cough syrup  Phone Note Call from Patient Call back at [phone]   Caller: Patient Summary of Call: Pt called wanting a cough medication called in. She is having coughing and congestion. She has used sudafed and vicks cough syrup otc whiched hasn't help. Pt states that the cough is mostly at night. [facility] Initial call taken by: Romualdo Bolk, CMA Duncan Dull),  July 12, 2009 10:43 AM  Follow-up for Phone Call        Per Dr. Fabian Sharp- hycodan syrup 6oz 1-2 tsp q-4-6 hours as needed cough. rx called [facility] and not to [facility] [address]. Pt aware. Follow-up by: Romualdo Bolk, CMA (AAMA),  July 12, 2009 4:48 PM    Prescriptions: HYDROCODONE-HOMATROPINE 5-1.5 MG/5ML SYRP (HYDROCODONE-HOMATROPINE) 1-2 teaspoons q 6 hrs as needed cough.  #6 oz x 0   Entered by:   Romualdo Bolk, CMA (AAMA)   Authorized by:   Madelin Headings MD   Signed by:   Romualdo Bolk, CMA (AAMA) on 07/12/2009   Method used:   Telephoned to ...       [facility]  [address]  [phone]* (retail)       [address]       [place], [place]  [address]       Ph: [phone] or [phone]       Fax: [phone]   RxID:   [id number]

## 2010-07-31 NOTE — Assessment & Plan Note (Signed)
Summary: MED FUP//CCM   Vital Signs:  Patient profile:   45 year old female Menstrual status:  regular LMP:     04/03/2010 Height:      61.5 inches Weight:      156 pounds BMI:     29.10 Pulse rate:   66 / minute BP sitting:   130 / 80  (right arm) Cuff size:   regular  Vitals Entered By: Romualdo Bolk, CMA (AAMA) (April 24, 2010 3:01 PM)  Serial Vital Signs/Assessments:  Time      Position  BP       Pulse  Resp  Temp     By                     120/80                         Madelin Headings MD  CC: Follow-up visit on meds, Hypertension Management LMP (date): 04/03/2010 LMP - Character: heavy Menarche (age onset years): 12   Menses interval (days): varies Menstrual flow (days): 7 Enter LMP: 04/03/2010 Last PAP Result NEGATIVE FOR INTRAEPITHELIAL LESIONS OR MALIGNANCY.   History of Present Illness: Margaret Howard  comes in today   for fu of multiple medical problems . Since last visit she has done well and would like to continue to wean meds.  #She is now down to 30 mg of cymbalta   doing ok and constipation better.  Trying to lose weight . More active . no new ortho problems. #Still on  wellbutrin   300 .  no sig se of med . #Bp has been doing well  1./2   of medication .   wants to try to de eventually  #HAs  : quiescent and  doing well . #Breast  vs axilla fullness . Has eval  and no dx but radiology following. ,  had mri needle bx for 2 cm Ln and following  irreg on breast MRI.  felt to be benign   at this point.     Hypertension History:      She denies headache, chest pain, palpitations, dyspnea with exertion, orthopnea, PND, peripheral edema, visual symptoms, neurologic problems, syncope, and side effects from treatment.  She notes no problems with any antihypertensive medication side effects.        Positive major cardiovascular risk factors include hypertension.  Negative major cardiovascular risk factors include female age less than 62 years old and  non-tobacco-user status.     Preventive Screening-Counseling & Management  Alcohol-Tobacco     Alcohol drinks/day: socially     Smoking Status: quit     Year Quit: 24 year ago  Caffeine-Diet-Exercise     Caffeine use/day: 3     Does Patient Exercise: no     Type of exercise: walk     Times/week: 7  Current Medications (verified): 1)  Metoprolol Succinate 100 Mg  Tb24 (Metoprolol Succinate) .... 1/2  By Mouth Once Daily or As Directed 2)  Wellbutrin Xl 300 Mg Xr24h-Tab (Bupropion Hcl) .Marland Kitchen.. 1 By Mouth Once Daily 3)  Cymbalta 30 Mg Cpep (Duloxetine Hcl) .Marland Kitchen.. 1 By Mouth Once Daily  Allergies (verified): No Known Drug Allergies  Past History:  Past medical, surgical, family and social histories (including risk factors) reviewed, and no changes noted (except as noted below).  Past Medical History: Reviewed history from 07/29/2008 and no changes required. fibromyalgia arththralgias and positive ana  eval in the past per rheum(eye, neuro,rheum)  g3p3 fx foot 2004   HT         LAST Mammogram: 3/09 Pap: doing today  Td:  Colonscopy: n/a EKG: 02/24/07 LMP: 9/09 Dexa: 11/17/07  Consults Dr. Arlice Colt Dr. Melvyn Novas  Past Surgical History: Microdisecetomy and Laminectomy for back problems 1995 Rt Wrist surgery for carpal tunnel 06/29/08 Lf Foot- scar tissue removed 06/29/08  Lipoma removals left wrist CTS 2 /2011  Past History:  Care Management: Orthopedics: Dr. Lestine Box and Melvyn Novas Surgery:  Donell Beers   Family History: Reviewed history from 06/19/2009 and no changes required. Family History Hypertension in 70's no known sleep dx parents Melanoma ? both.   breast cancer mom  age 65s aunt cancer ovarian and colon cancer .Marland Kitchen   Father Oral Cancer    Social History: Reviewed history from 06/19/2009 and no changes required. 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 frog and a mouse.  [place]   now has  grandchild     daughter    married   Review of Systems  The patient denies anorexia, fever, weight loss, vision loss, decreased hearing, chest pain, syncope, dyspnea on exertion, prolonged cough, abdominal pain, melena, hematochezia, severe indigestion/heartburn, hematuria, transient blindness, difficulty walking, depression, abnormal bleeding, enlarged lymph nodes, and angioedema.    Physical Exam  General:  Well-developed,well-nourished,in no acute distress; alert,appropriate and cooperative throughout examination Head:  normocephalic and atraumatic.   Eyes:  vision grossly intact, pupils equal, and pupils round.   Ears:  R ear normal and L ear normal.   Neck:  No deformities, masses, or tenderness noted. Lungs:  Normal respiratory effort, chest expands symmetrically. Lungs are clear to auscultation, no crackles or wheezes. Heart:  Normal rate and regular rhythm. S1 and S2 normal without gallop, murmur, click, rub or other extra sounds. Abdomen:  Bowel sounds positive,abdomen soft and non-tender without masses, organomegaly or  s noted. Msk:  no joint warmth and no redness over joints.  no acute changes  Pulses:  pulses intact without delay   Extremities:  no clubbing cyanosis or edema  Neurologic:  non focal  gait normal  Skin:  turgor normal, color normal, no petechiae, and no purpura.   Cervical Nodes:  No lymphadenopathy noted Psych:  Oriented X3, memory intact for recent and remote, normally interactive, good eye contact, not anxious appearing, and not depressed appearing.   nl cognition   Impression & Recommendations:  Problem # 1:  ADJ DISORDER WITH MIXED ANXIETY & DEPRESSED MOOD (ICD-309.28) Assessment Improved reasonable to wean of meds for now as doing well   Problem # 2:  HYPERTENSION (ICD-401.9) Assessment: Unchanged lifestyle intervention and weight loss  ok to wean cymbalta and follow  then consider wean meds to assess need . labs today Her updated medication list  for this problem includes:    Metoprolol Succinate 100 Mg Tb24 (Metoprolol succinate) .Marland Kitchen... 1/2  by mouth once daily or as directed  Orders: Venipuncture ([address]) Specimen Handling ([address]) TLB-BMP (Basic Metabolic Panel-BMET) (80048-METABOL) TLB-Hepatic/Liver Function Pnl (80076-HEPATIC)  Problem # 3:  SYMPTOM, HEADACHE (ICD-784.0) Assessment: Improved controlled  The following medications were removed from the medication list:    Relpax 40 Mg Tabs (Eletriptan hydrobromide) .Marland Kitchen... 1 by mouth as needed headache may repeat in 2 hours    Co-gesic 5-500 Mg Tabs (Hydrocodone-acetaminophen) .Marland Kitchen... 1 by mouth q4-6 hours as needed headache  Naprosyn 500 Mg Tabs (Naproxen) .Marland Kitchen... 1 by mouth two times a day  for back pain Her updated medication list for this problem includes:    Metoprolol Succinate 100 Mg Tb24 (Metoprolol succinate) .Marland Kitchen... 1/2  by mouth once daily or as directed  Problem # 4:  MASS, LEFT AXILLA (ICD-782.2) following   per radiology  prob benign   with ? irreg on MRI breast.   Problem # 5:  FIBROMYALGIA (ICD-729.1) Assessment: Improved ess nl adls  and controlled  ok to wean from cymbalta  The following medications were removed from the medication list:    Co-gesic 5-500 Mg Tabs (Hydrocodone-acetaminophen) .Marland Kitchen... 1 by mouth q4-6 hours as needed headache    Naprosyn 500 Mg Tabs (Naproxen) .Marland Kitchen... 1 by mouth two times a day  for back pain    Flexeril 10 Mg Tabs (Cyclobenzaprine hcl) .Marland Kitchen... 1 by mouth three times a day  Complete Medication List: 1)  Metoprolol Succinate 100 Mg Tb24 (Metoprolol succinate) .... 1/2  by mouth once daily or as directed 2)  Wellbutrin Xl 300 Mg Xr24h-tab (Bupropion hcl) .Marland Kitchen.. 1 by mouth once daily 3)  Cymbalta 30 Mg Cpep (Duloxetine hcl) .Marland Kitchen.. 1 by mouth once daily 4)  Wellbutrin Xl 150 Mg Xr24h-tab (Bupropion hcl) .Marland Kitchen.. 1 by mouth once daily or as directed  Hypertension Assessment/Plan:      The patient's hypertensive risk group is category A: No risk  factors and no target organ damage.  Her calculated 10 year risk of coronary heart disease is 4 %.  Today's blood pressure is 130/80.  Her blood pressure goal is < 140/90.  Patient Instructions: 1)  try every other day of the cymbalta  for a few week and then try stopping . 2)  can stop wellbutrin next   or decrease dosing.  3)  Monitor your blood pressure reading s 4)  You will be informed of lab results when available.  5)  when weaned off meds then ROV to discuss blood pressure.  Prescriptions: WELLBUTRIN XL 150 MG XR24H-TAB (BUPROPION HCL) 1 by mouth once daily or as directed  #30 x 1   Entered and Authorized by:   Madelin Headings MD   Signed by:   Madelin Headings MD on 04/24/2010   Method used:   Print then Give to Patient   RxID:   [id number]    Orders Added: 1)  Venipuncture [[address]] 2)  Specimen Handling [[address]] 3)  TLB-BMP (Basic Metabolic Panel-BMET) [80048-METABOL] 4)  TLB-Hepatic/Liver Function Pnl [80076-HEPATIC] 5)  Est. Patient Level IV [10[address]]

## 2010-07-31 NOTE — Assessment & Plan Note (Signed)
Summary: inner ear pain/pt cant hear out of ear/cjr   Vital Signs:  Patient profile:   45 year old female Menstrual status:  regular Weight:      169 pounds Temp:     98.0 degrees F oral Pulse rate:   66 / minute BP sitting:   120 / 80  (left arm) Cuff size:   regular  Vitals Entered By: Romualdo Bolk, CMA (AAMA) (July 07, 2009 8:11 AM) CC: Pt states that she still can't hear out of left ear. The same night she saw [place] she had extreme pain and saw [facility] and they gave a Zpac and Prednisone. Then 1 week later saw Dr. Kirtland Bouchard and was given augmentin. Pt doesn't have anymore pain but she just can't hear out of left ear.Knot on left side of breast as well.  Hearing Screen  20db HL: Left  500 hz: 20db 1000 hz: 20db 2000 hz: 10db 4000 hz: 25db Right  500 hz: 10db 1000 hz: 10db 2000 hz: 15db 4000 hz: 15db   Hearing Testing Entered By: Romualdo Bolk, CMA (AAMA) (July 07, 2009 8:24 AM)   History of Present Illness: Margaret Howard comes in today. for acouuple of things   * Since last visit   had left ear pain  dx infection and seen urgent care  and given prednisone and z pack  and then because of continuing .  signs  saw Dr Kirtland Bouchard  and  was given different antibiotic  augmentin and now better pain.  However has persistent decreasing hearing on left with out pain.  uri better.  NO balance probelm falling numbness or weakness. *New left tender lump under left axilla for a few weeks   . No help with the antibiotics. No fever but very worried as is hard to sleep and  because of hx of her other lumps.  No breast lumps but is premenstrual   and get tender at times.  No trauma . some paind goes down her arm with this likd pressing on a nerve. No weakness.  . Has cat no scratch and no kitten.   No rash of infection of left arm, * to have cts on left  in february .  Preventive Screening-Counseling & Management  Alcohol-Tobacco     Alcohol drinks/day: socially     Smoking  Status: quit     Year Quit: 24 year ago  Caffeine-Diet-Exercise     Caffeine use/day: 3     Does Patient Exercise: no     Type of exercise: walk     Times/week: 7  Current Medications (verified): 1)  Metoprolol Succinate 100 Mg  Tb24 (Metoprolol Succinate) .... 1/2  By Mouth Once Daily or As Directed 2)  Relpax 40 Mg  Tabs (Eletriptan Hydrobromide) .Marland Kitchen.. 1 By Mouth As Needed Headache May Repeat in 2 Hours 3)  Cymbalta 30 Mg Cpep (Duloxetine Hcl) .Marland Kitchen.. 1 By Mouth Once Daily 4)  Co-Gesic 5-500 Mg  Tabs (Hydrocodone-Acetaminophen) .Marland Kitchen.. 1 By Mouth Q4-6 Hours As Needed Headache 5)  Wellbutrin Xl 300 Mg Xr24h-Tab (Bupropion Hcl) .Marland Kitchen.. 1 By Mouth Once Daily 6)  Naprosyn 500 Mg Tabs (Naproxen) .Marland Kitchen.. 1 By Mouth Two Times A Day  For Back Pain 7)  Flexeril 10 Mg Tabs (Cyclobenzaprine Hcl) .Marland Kitchen.. 1 By Mouth Three Times A Day 8)  Hydrocodone-Homatropine 5-1.5 Mg/42ml Syrp (Hydrocodone-Homatropine) .Marland Kitchen.. 1-2 Teaspoons Q 6 Hrs As Needed Cough. 9)  Cymbalta 60 Mg Cpep (Duloxetine Hcl) .Marland KitchenMarland KitchenMarland Kitchen 1  By Mouth Once Daily  Allergies (verified): No Known Drug Allergies  Past History:  Past Medical History: Last updated: 07/29/2008 fibromyalgia arththralgias and positive ana  eval in the past per rheum(eye, neuro,rheum)  g3p3 fx foot 2004   HT         LAST Mammogram: 3/09 Pap: doing today  Td:  Colonscopy: n/a EKG: 02/24/07 LMP: 9/09 Dexa: 11/17/07  Consults Dr. Arlice Colt Dr. Melvyn Novas  Past Surgical History: Last updated: 02/22/2009 Microdisecetomy and Laminectomy for back problems 1995 Rt Wrist surgery for carpal tunnel 06/29/08 Lf Foot- scar tissue removed 06/29/08  Lipoma removals  Family History: Last updated: 06/19/2009 Family History Hypertension in 70's no known sleep dx parents Melanoma ? both.   breast cancer mom  age 18s aunt cancer ovarian and colon cancer .Marland Kitchen   Father Oral Cancer    Social History: Last updated: 06/19/2009 Married Never Smoked Occupation: Geophysicist/field seismologist pre [employer]     has children    HH of 4  4 dogs cat bird. frog and a mouse.  [place]    Review of Systems  The patient denies anorexia, fever, weight loss, weight gain, vision loss, hoarseness, chest pain, prolonged cough, abdominal pain, melena, hematuria, transient blindness, difficulty walking, abnormal bleeding, and angioedema.         no night sweats . no hx of mono  Physical Exam  General:  Well-developed,well-nourished,in no acute distress; alert,appropriate and cooperative throughout examination Head:  normocephalic and atraumatic.   Eyes:  vision grossly intact and pupils equal.   Ears:  R ear normal.  slight distortion   of light reflex   bony lom ok   Nose:  no external deformity, no external erythema, and no nasal discharge.   Mouth:  pharynx pink and moist.   Neck:  No deformities, masses, or tenderness noted.  Chest Wall:  no deformities, no tenderness, and no mass.   Lungs:  Normal respiratory effort, chest expands symmetrically. Lungs are clear to auscultation, no crackles or wheezes.normal breath sounds.   Heart:  Normal rate and regular rhythm. S1 and S2 normal without gallop, murmur, click, rub or other extra sounds.no lifts.   Abdomen:  Bowel sounds positive,abdomen soft and non-tender without masses, organomegaly or  noted. Msk:  no acute swellings Pulses:  pulses intact without delay   Extremities:  no clubbing cyanosis or edema  Skin:  turgor normal, color normal, no rashes, no ecchymoses, no petechiae, and no purpura.    no change in other skin lumps    see past note  Cervical Nodes:  No lymphadenopathy noted Axillary Nodes:  no R axillary adenopathy.  swelling left axilla  ? fimrm and mobile  ? 2 cm    Inguinal Nodes:  No significant adenopathy Psych:  Oriented X3, good eye contact, and not anxious appearing.   Additional Exam:  see hearing tess minor change on left   Impression & Recommendations:  Problem # 1:  MASS, LEFT AXILLA (ICD-782.2) tender and acts more  like ln than other but unsure with other multiple large tender lumps  supposedly to be lipoma. check labs r/o mono  x ray etc.  Orders: TLB-CBC Platelet - w/Differential (85025-CBCD) TLB-Hepatic/Liver Function Pnl (80076-HEPATIC) TLB-Sedimentation Rate (ESR) (85652-ESR) T-CMV IgM  Antibody ([address]) T-2 View CXR (71020TC) Monospot (409[address]) Radiology Referral (Radiology)  Problem # 2:  LOM (ICD-382.9) Assessment: Improved residular heaing loss prob from  eustachian tube dysfunction. expect improvement in next 2-3 weeks . rov  if persistent and progressive  The following medications were removed from the medication list:    Amoxicillin-pot Clavulanate 875-125 Mg Tabs (Amoxicillin-pot clavulanate) ..... One twice daily with meals Her updated medication list for this problem includes:    Naprosyn 500 Mg Tabs (Naproxen) .Marland Kitchen... 1 by mouth two times a day  for back pain  Problem # 3:  OTHER SPECIFIED DISORDER OF SKIN (ICD-709.8) multiple  lumps . m  Complete Medication List: 1)  Metoprolol Succinate 100 Mg Tb24 (Metoprolol succinate) .... 1/2  by mouth once daily or as directed 2)  Relpax 40 Mg Tabs (Eletriptan hydrobromide) .Marland Kitchen.. 1 by mouth as needed headache may repeat in 2 hours 3)  Cymbalta 30 Mg Cpep (Duloxetine hcl) .Marland Kitchen.. 1 by mouth once daily 4)  Co-gesic 5-500 Mg Tabs (Hydrocodone-acetaminophen) .Marland Kitchen.. 1 by mouth q4-6 hours as needed headache 5)  Wellbutrin Xl 300 Mg Xr24h-tab (Bupropion hcl) .Marland Kitchen.. 1 by mouth once daily 6)  Naprosyn 500 Mg Tabs (Naproxen) .Marland Kitchen.. 1 by mouth two times a day  for back pain 7)  Flexeril 10 Mg Tabs (Cyclobenzaprine hcl) .Marland Kitchen.. 1 by mouth three times a day 8)  Hydrocodone-homatropine 5-1.5 Mg/53ml Syrp (Hydrocodone-homatropine) .Marland Kitchen.. 1-2 teaspoons q 6 hrs as needed cough. 9)  Cymbalta 60 Mg Cpep (Duloxetine hcl) .Marland Kitchen.. 1 by mouth once daily  Patient Instructions: 1)  get chest x ray   2)  You will be informed of lab results when available.  3)  ultrasound of left  axilla    4)  then plan follow up  Prescriptions: CYMBALTA 30 MG CPEP (DULOXETINE HCL) 1 by mouth once daily  #30 x 0   Entered by:   Romualdo Bolk, CMA (AAMA)   Authorized by:   Madelin Headings MD   Signed by:   Romualdo Bolk, CMA (AAMA) on 07/07/2009   Method used:   [facility]        [facility]  [phone][facility] ([facility]ail)       [address] [place], [place]  [address]       Ph: [phone] or [phone]       Fax: [phone]   RxID:   [id number] CYMBALTA 60 MG CPEP (DULOXETINE HCL) 1 by mouth once daily  #30 x 0   Entered by:   Romualdo Bolk, CMA (AAMA)   Authorized by:   Madelin Headings MD   Signed by:   Romualdo Bolk, CMA (AAMA) on 07/07/2009   Method used:   [facility] to        [facility]  [phone]* (retail)       [address] [place], [place]  [address]       Ph: [phone] or [phone]       Fax: [phone]   RxID:   [id number]   Laboratory Results   Blood Tests      Mono: negative Comments: Joanne Chars CMA  July 07, 2009 9:51 AM

## 2010-08-01 ENCOUNTER — Other Ambulatory Visit (AMBULATORY_SURGERY_CENTER): Payer: Managed Care, Other (non HMO) | Admitting: Gastroenterology

## 2010-08-01 ENCOUNTER — Encounter: Payer: Self-pay | Admitting: Internal Medicine

## 2010-08-01 ENCOUNTER — Encounter: Payer: Self-pay | Admitting: Gastroenterology

## 2010-08-01 ENCOUNTER — Ambulatory Visit: Admit: 2010-08-01 | Payer: Self-pay | Admitting: Gastroenterology

## 2010-08-01 DIAGNOSIS — K6289 Other specified diseases of anus and rectum: Secondary | ICD-10-CM

## 2010-08-01 LAB — HM COLONOSCOPY

## 2010-08-02 NOTE — Progress Notes (Signed)
Summary: refill on hydrocodone/ace  Phone Note From Pharmacy   [facility]er: [facility]  [phone]* Reason for Call: Needs renewal Details for Reason: Hydrocodone/Ace 5/325 Summary of Call: last filled on 05/14/10 #15 Initial call taken by: Romualdo Bolk, CMA (AAMA),  July 03, 2010 4:37 PM  Follow-up for Phone Call        Is this for her shoulder?   ok to refill   x1 and document  reason for need. Follow-up by: Madelin Headings MD,  July 03, 2010 5:49 PM  Additional Follow-up for Phone Call Additional follow up Details #1::        Left message on machine to call back. Rx sent to pharmacy. Additional Follow-up by: Romualdo Bolk, CMA (AAMA),  July 04, 2010 9:05 AM    Additional Follow-up for Phone Call Additional follow up Details #2::    Pt called back saying that she is using this for ha's. Follow-up by: Romualdo Bolk, CMA (AAMA),  July 04, 2010 4:39 PM  Prescriptions: HYDROCODONE-ACETAMINOPHEN 5-325 MG TABS (HYDROCODONE-ACETAMINOPHEN) 1 by mouth  every 4-6 hours as needed severe pain  #15 x 0   Entered by:   Romualdo Bolk, CMA (AAMA)   Authorized by:   Madelin Headings MD   Signed by:   Romualdo Bolk, CMA (AAMA) on 07/04/2010   Method used:   Handwritten   RxID:   [id number]

## 2010-08-02 NOTE — Assessment & Plan Note (Signed)
History of Present Illness Visit Type: and Primary GI MD: Rob Bunting MD Primary Provider: Berniece Andreas, MD Requesting Provider: Berniece Andreas, MD Chief Complaint: rectal pain History of Present Illness:       very pleasant 45 year old woman who has been having lower abd pain.  In past two weeks rectal throbbing.  Her bowels have been very good lately (was constipated from cymbalta, but is coming off the meds).  bilateral lower pelvis pain.  Pressure on her back, throbbing in rectum.  The throbbing occured at night.  No recent rectal trauma.  All started with pelvic pain.  she is having transvaginal US later today...periods abruptly stopped.Marland Kitchen  lost 14 pounds in 6 months, dieting.           Current Medications (verified): 1)  Metoprolol Succinate 100 Mg  Tb24 (Metoprolol Succinate) .... 1/2  By Mouth Once Daily or As Directed 2)  Cymbalta 30 Mg Cpep (Duloxetine Hcl) .Marland Kitchen.. 1 By Mouth Once Daily 3)  Cyclobenzaprine Hcl 10 Mg Tabs (Cyclobenzaprine Hcl) .Marland Kitchen.. 1 By Mouth Three Times A Day As Needed Muscle Spasm. 4)  Hydrocodone-Acetaminophen 5-325 Mg Tabs (Hydrocodone-Acetaminophen) .Marland Kitchen.. 1 By Mouth  Every 4-6 Hours As Needed Severe Pain 5)  Prevacid 24hr 15 Mg Cpdr (Lansoprazole) .... Once Daily  Allergies (verified): No Known Drug Allergies  Past History:  Past Medical History: fibromyalgia arththralgias and positive ana  eval in the past per rheum(eye, neuro,rheum)  g3p3 fx foot 2004   HT         LAST Mammogram: 3/09 Pap: doing today  Td:  Colonscopy: n/a EKG: 02/24/07 LMP: 9/09 Dexa: 11/17/07    Consults Dr. Arlice Colt Dr. Melvyn Novas  Past Surgical History: Microdisecetomy and Laminectomy for back problems 1995 Rt Wrist surgery for carpal tunnel 06/29/08 Lf Foot- scar tissue removed 06/29/08  Lipoma removals left wrist CTS 2 /2011    Family History: Family History Hypertension in 70's no known sleep dx parents Melanoma ? both.   breast cancer mom  age 96s aunt  cancer ovarian and colon cancer .Marland Kitchen   Father Oral Cancer   COlon cancer in cousins    Social History: Married Never Smoked Occupation: Geophysicist/field seismologist pre [employer]   has children    HH of 4    4 dogs cat bird. frog and a mouse.  [place]   now has grandchild     daughter    married     Review of Systems       Pertinent positive and negative review of systems were noted in the above HPI and GI specific review of systems.  All other review of systems was otherwise negative.   Vital Signs:  Patient profile:   45 year old female Menstrual status:  regular Height:      61 inches Weight:      152 pounds Pulse rate:   80 / minute Pulse rhythm:   regular BP sitting:   132 / 88  (left arm)  Vitals Entered By: Milford Cage NCMA (July 20, 2010 8:35 AM)  Physical Exam  Additional Exam:  Constitutional: generally well appearing Psychiatric: alert and oriented times 3 Eyes: extraocular movements intact Mouth: oropharynx moist, no lesions Neck: supple, no lymphadenopathy Cardiovascular: heart regular rate and rythm Lungs: CTA bilaterally Abdomen: soft, non-tender, non-distended, no obvious ascites, no peritoneal signs, normal bowel sounds Extremities: no lower extremity edema bilaterally Skin: no lesions on visible extremities    Impression & Recommendations:  Problem # 1:  lower pelvic  pain, rectal throbbing none of her symptoms are related to her bowels which have actually been the best they have been in many years since she is decreasing her Cymbalta. I think pelvic sonogram is a very good idea for gynecologic issues. It is interesting that her periods abruptly stopped about 5 months ago. Given her symptoms I think we should proceed with colonoscopy even if they pelvic process is noted on sonogram. Another reason for this is the fact that 3 of her second-degree relatives have had colon cancer. I will track down the results of her pelvic sonogram later today and we will schedule her  for colonoscopy at her soonest convenience.  Patient Instructions: 1)  You will be scheduled to have a colonoscopy. 2)  We will check on results of your ultrasound later today. 3)  A copy of this information will be sent to Dr. Fabian Sharp. 4)  The medication list was reviewed and reconciled.  All changed / newly prescribed medications were explained.  A complete medication list was provided to the patient / caregiver.  Appended Document: Orders Update    Clinical Lists Changes  Medications: Added new medication of MOVIPREP 100 GM  SOLR (PEG-KCL-NACL-NASULF-NA ASC-C) As per prep instructions. - Signed Rx of MOVIPREP 100 GM  SOLR (PEG-KCL-NACL-NASULF-NA ASC-C) As per prep instructions.;  #1 x 0;  Signed;  Entered by: Lamona Curl CMA (AAMA);  Authorized by: Rachael Fee MD;  Method used: Electronically to [facility]  [id number]*, [address] [place], [place]  [address], Ph: [phone] or [phone], Fax: [phone] Orders: Added new Test order of Colonoscopy (Colon) - Signed    Prescriptions: MOVIPREP 100 GM  SOLR (PEG-KCL-NACL-NASULF-NA ASC-C) As per prep instructions.  #1 x 0   Entered by:   Lamona Curl CMA (AAMA)   Authorized by:   Rachael Fee MD   Signed by:   Lamona Curl CMA (AAMA) on 07/20/2010   Method used:   [facility]ronically to        [facility]  [phone]* (retail)       [address] [place], [place]  [address]       Ph: [phone] or [phone]       Fax: [phone]   RxID:   [id number]

## 2010-08-02 NOTE — Letter (Signed)
Summary: [facility] Instructions  [facility]  [address] [place], [place] [address]   Phone: [phone]  Fax: [phone]       Margaret Howard    07-26-1965    MRN: [id number]        Procedure Day Dorna Bloom: Wednesday 08/01/10     Arrival Time: 10:30 am     Procedure Time: 11:30 am     Location of Procedure:                    _x _  [facility] (4th Floor)  PREPARATION FOR COLONOSCOPY WITH MOVIPREP   Starting 5 days prior to your procedure 07/28/10 do not eat nuts, seeds, popcorn, corn, beans, peas,  salads, or any raw vegetables.  Do not take any fiber supplements (e.g. Metamucil, Citrucel, and Benefiber).  THE DAY BEFORE YOUR PROCEDURE         DATE: 07/31/10  DAY: Tuesday  1.  Drink clear liquids the entire day-NO SOLID FOOD  2.  Do not drink anything colored red or purple.  Avoid juices with pulp.  No orange juice.  3.  Drink at least 64 oz. (8 glasses) of fluid/clear liquids during the day to prevent dehydration and help the prep work efficiently.  CLEAR LIQUIDS INCLUDE: Water Jello Ice Popsicles Tea (sugar ok, no milk/cream) Powdered fruit flavored drinks Coffee (sugar ok, no milk/cream) Gatorade Juice: apple, white grape, white cranberry  Lemonade Clear bullion, consomm, broth Carbonated beverages (any kind) Strained chicken noodle soup Hard Candy                             4.  In the morning, mix first dose of MoviPrep solution:    Empty 1 Pouch A and 1 Pouch B into the disposable container    Add lukewarm drinking water to the top line of the container. Mix to dissolve    Refrigerate (mixed solution should be used within 24 hrs)  5.  Begin drinking the prep at 5:00 p.m. The MoviPrep container is divided by 4 marks.   Every 15 minutes drink the solution down to the next mark (approximately 8 oz) until the full liter is complete.   6.  Follow completed prep with 16 oz of clear liquid of your choice (Nothing red or purple).   Continue to drink clear liquids until bedtime.  7.  Before going to bed, mix second dose of MoviPrep solution:    Empty 1 Pouch A and 1 Pouch B into the disposable container    Add lukewarm drinking water to the top line of the container. Mix to dissolve    Refrigerate  THE DAY OF YOUR PROCEDURE      DATE: 08/01/10 DAY: Wednesday  Beginning at 6:30 a.m. (5 hours before procedure):         1. Every 15 minutes, drink the solution down to the next mark (approx 8 oz) until the full liter is complete.  2. Follow completed prep with 16 oz. of clear liquid of your choice.    3. You may drink clear liquids until 9:30 am (2 HOURS BEFORE PROCEDURE).   MEDICATION INSTRUCTIONS  Unless otherwise instructed, you should take regular prescription medications with a small sip of water   as early as possible the morning of your procedure.       OTHER INSTRUCTIONS  You will need a responsible adult at least 47 years of  age to accompany you and drive you home.   This person must remain in the waiting room during your procedure.  Wear loose fitting clothing that is easily removed.  Leave jewelry and other valuables at home.  However, you may wish to bring a book to read or  an iPod/MP3 player to listen to music as you wait for your procedure to start.  Remove all body piercing jewelry and leave at home.  Total time from sign-in until discharge is approximately 2-3 hours.  You should go home directly after your procedure and rest.  You can resume normal activities the  day after your procedure.  The day of your procedure you should not:   Drive   Make legal decisions   Operate machinery   Drink alcohol   Return to work  You will receive specific instructions about eating, activities and medications before you leave.    MRN [id number]   The above instructions have been reviewed and explained to me by   _______________________    I fully understand and can verbalize these  instructions _____________________________ Date _________

## 2010-08-02 NOTE — Assessment & Plan Note (Signed)
Summary: pelvic pain/cjr   Vital Signs:  Patient profile:   45 year old female Menstrual status:  regular LMP:     03/25/2010 Weight:      151 pounds Temp:     97.9 degrees F oral Pulse rate:   60 / minute BP sitting:   120 / 80  (right arm) Cuff size:   regular  Vitals Entered By: Romualdo Bolk, CMA (AAMA) (July 16, 2010 9:51 AM) CC: Pelvic pain and pressure on colon area. Pt is having lower back pain but it's not her back. No burning upon urination. LMP (date): 03/25/2010 LMP - Character: heavy Menarche (age onset years): 12   Menses interval (days): varies Menstrual flow (days): 7 Enter LMP: 03/25/2010 Last PAP Result NEGATIVE FOR INTRAEPITHELIAL LESIONS OR MALIGNANCY.   History of Present Illness: Gillian Meeuwsen comes in today   for acute evaluation. CO of 2 week hx of  pain in "pelvic rectal" area  inside. not associated with eating bowerl or gu function  Pain throbbing   in rectal area inside and then to  lower back . "pressure  on colon." But no BMs and no relief with this  No hx  of  endometriosis.     or hsv.      Pain worse at night.  No period  for 4 months.    usually regular.  LMP Sept 25th.  No sig hot flushes Rx for uti signs  last month and got better. Monogomous  relationship  married .  weaning off the cymbalta and bowel function and mood is good.  using cymbalta every other day for now. . Headaches   in crased in the past week  .   took sinus medication.   sinustab every day for a week  also  rescue med( see mmed list.)  Preventive Screening-Counseling & Management  Alcohol-Tobacco     Alcohol drinks/day: socially     Smoking Status: quit     Year Quit: 24 year ago  Caffeine-Diet-Exercise     Caffeine use/day: 3     Does Patient Exercise: no     Type of exercise: walk     Times/week: 7  Current Medications (verified): 1)  Metoprolol Succinate 100 Mg  Tb24 (Metoprolol Succinate) .... 1/2  By Mouth Once Daily or As Directed 2)  Cymbalta 30 Mg  Cpep (Duloxetine Hcl) .Marland Kitchen.. 1 By Mouth Once Daily 3)  Cyclobenzaprine Hcl 10 Mg Tabs (Cyclobenzaprine Hcl) .Marland Kitchen.. 1 By Mouth Three Times A Day As Needed Muscle Spasm. 4)  Hydrocodone-Acetaminophen 5-325 Mg Tabs (Hydrocodone-Acetaminophen) .Marland Kitchen.. 1 By Mouth  Every 4-6 Hours As Needed Severe Pain 5)  Prevacid 24hr 15 Mg Cpdr (Lansoprazole) .... Once Daily  Allergies (verified): No Known Drug Allergies  Past History:  Past medical, surgical, family and social histories (including risk factors) reviewed, and no changes noted (except as noted below).  Past Medical History: Reviewed history from 07/29/2008 and no changes required. fibromyalgia arththralgias and positive ana  eval in the past per rheum(eye, neuro,rheum)  g3p3 fx foot 2004   HT         LAST Mammogram: 3/09 Pap: doing today  Td:  Colonscopy: n/a EKG: 02/24/07 LMP: 9/09 Dexa: 11/17/07  Consults Dr. Arlice Colt Dr. Melvyn Novas  Past Surgical History: Reviewed history from 04/24/2010 and no changes required. Microdisecetomy and Laminectomy for back problems 1995 Rt Wrist surgery for carpal tunnel 06/29/08 Lf Foot- scar tissue removed 06/29/08  Lipoma removals left wrist CTS 2 /2011  Past History:  Care Management: Orthopedics: Dr. Lestine Box and Melvyn Novas Surgery:  Donell Beers   Family History: Reviewed history from 06/19/2009 and no changes required. Family History Hypertension in 70's no known sleep dx parents Melanoma ? both.   breast cancer mom  age 29s aunt cancer ovarian and colon cancer .Marland Kitchen   Father Oral Cancer   COlon cancer in cousins  Social History: Reviewed history from 04/24/2010 and no changes required. 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 frog and a mouse.  [place]   now has grandchild     daughter    married   Review of Systems       The patient complains of headaches.  The patient denies anorexia, fever, weight loss, chest pain,  prolonged cough, melena, hematochezia, severe indigestion/heartburn, hematuria, incontinence, genital sores, muscle weakness, difficulty walking, depression, and enlarged lymph nodes.         recent migraine headaches  seem from weather change  Physical Exam  General:  Well-developed,well-nourished,in no acute distress; alert,appropriate and cooperative throughout examination Head:  normocephalic and atraumatic.   Eyes:  vision grossly intact, pupils equal, and pupils round.   Lungs:  normal respiratory effort and no intercostal retractions.   Heart:  normal rate and regular rhythm.   Abdomen:  soft, non-tender, normal bowel sounds, no distention, no masses, no abdominal hernia, no inguinal hernia, no hepatomegaly, and no splenomegaly.   Rectal:  no external abnormalities, normal sphincter tone, and no masses. heme smear  neg  Genitalia:  Pelvic Exam:        External: normal female genitalia without lesions or masses        Vagina: normal without lesions or masses        Cervix: posterior  but nl without lesions or masses        Adnexa: normal bimanual exam without masses or fullness hd to tell on lthe left         Uterus: non tender by palpation        Pap smear: performed Pulses:  nl cap refill  Neurologic:  non focal alert & oriented X3 and gait normal.   Skin:  turgor normal, color normal, no ecchymoses, and no petechiae.   Cervical Nodes:  No lymphadenopathy noted Psych:  Oriented X3, normally interactive, good eye contact, not anxious appearing, and not depressed appearing.     Impression & Recommendations:  Problem # 1:  PELVIC  PAIN (ICD-789.09) unsure cause  gi gyne or even radiation from back? sacral .  exam unrevealing today  although unsure of left adnexa.  Her updated medication list for this problem includes:    Cyclobenzaprine Hcl 10 Mg Tabs (Cyclobenzaprine hcl) .Marland Kitchen... 1 by mouth three times a day as needed muscle spasm.    Hydrocodone-acetaminophen 5-325 Mg Tabs  (Hydrocodone-acetaminophen) .Marland Kitchen... 1 by mouth  every 4-6 hours as needed severe pain  Orders: UA Dipstick w/o Micro (manual) (16109) Radiology Referral (Radiology) Pap Smear, Thin Prep ( Collection of) (U0454)  Problem # 2:  RECTAL PAIN (UJW-119.14)  see above  but no change in bowel habits/  fam hx of colon cancer   Orders: Gastroenterology Referral (GI) Radiology Referral (Radiology)  Problem # 3:  DELAYED MENSES (ICD-626.8) supect perimenopausa but with signs get [place] r/o cyst etc.   Orders: Radiology Referral (Radiology)  Problem # 4:  SYMPTOM, HEADACHE (ICD-784.0) Assessment: Deteriorated seems to  be weather related and not related to above.  Her updated medication list for this problem includes:    Metoprolol Succinate 100 Mg Tb24 (Metoprolol succinate) .Marland Kitchen... 1/2  by mouth once daily or as directed    Hydrocodone-acetaminophen 5-325 Mg Tabs (Hydrocodone-acetaminophen) .Marland Kitchen... 1 by mouth  every 4-6 hours as needed severe pain  Problem # 5:  ADJ DISORDER WITH MIXED ANXIETY & DEPRESSED MOOD (ICD-309.28) Assessment: Improved continue to wean meds   Complete Medication List: 1)  Metoprolol Succinate 100 Mg Tb24 (Metoprolol succinate) .... 1/2  by mouth once daily or as directed 2)  Cymbalta 30 Mg Cpep (Duloxetine hcl) .Marland Kitchen.. 1 by mouth once daily 3)  Cyclobenzaprine Hcl 10 Mg Tabs (Cyclobenzaprine hcl) .Marland Kitchen.. 1 by mouth three times a day as needed muscle spasm. 4)  Hydrocodone-acetaminophen 5-325 Mg Tabs (Hydrocodone-acetaminophen) .Marland Kitchen.. 1 by mouth  every 4-6 hours as needed severe pain 5)  Prevacid 24hr 15 Mg Cpdr (Lansoprazole) .... Once daily  Patient Instructions: 1)  Unsure why you are having this pain.  Will get Consult  GI  . 2)  in the meantime consider getting pelvic /vaginal  Ultrasound.   Orders Added: 1)  UA Dipstick w/o Micro (manual) [81002] 2)  Gastroenterology Referral [GI] 3)  Radiology Referral [Radiology] 4)  Est. Patient Level IV [64332] 5)  Pap Smear, Thin  Prep ( Collection of) [Q0091]    Laboratory Results   Urine Tests  Date/Time Received: July 16, 2010 10:09 AM   Routine Urinalysis   Color: yellow Appearance: Clear Glucose: negative   (Normal Range: Negative) Bilirubin: negative   (Normal Range: Negative) Ketone: negative   (Normal Range: Negative) Spec. Gravity: <1.005   (Normal Range: 1.003-1.035) Blood: trace-lysed   (Normal Range: Negative) pH: 6.5   (Normal Range: 5.0-8.0) Protein: negative   (Normal Range: Negative) Urobilinogen: 0.2   (Normal Range: 0-1) Nitrite: negative   (Normal Range: Negative) Leukocyte Esterace: negative   (Normal Range: Negative)    Urine HCG: negative Comments: Romualdo Bolk, CMA (AAMA)  July 16, 2010 10:10 AM

## 2010-08-08 NOTE — Procedures (Signed)
Summary: Colonoscopy  Patient: Margaret Howard Note: All result statuses are Final unless otherwise noted.  Tests: (1) Colonoscopy (COL)   COL Colonoscopy           DONE     [facility]     [address]     [place], [place]  [address]           COLONOSCOPY PROCEDURE REPORT           PATIENT:  Margaret Howard, Margaret Howard  MR#:  [id number]     BIRTHDATE:  [birth year], 44 yrs. old  GENDER:  female     ENDOSCOPIST:  Rachael Fee, MD     REF. BY:  Neta Mends. Panosh, M.D.     PROCEDURE DATE:  08/01/2010     PROCEDURE:  Diagnostic Colonoscopy     ASA CLASS:  Class II     INDICATIONS:  fullness in rectum     MEDICATIONS:   Fentanyl 75 mcg IV, Versed 6 mg IV           DESCRIPTION OF PROCEDURE:   After the risks benefits and     alternatives of the procedure were thoroughly explained, informed     consent was obtained.  Digital rectal exam was performed and     revealed no rectal masses.   The LB [id number] endoscope     was introduced through the anus and advanced to the terminal ileum     which was intubated for a short distance, without limitations.     The quality of the prep was good, using MoviPrep.  The instrument     was then slowly withdrawn as the colon was fully examined.     <<PROCEDUREIMAGES>>     FINDINGS:  The terminal ileum appeared normal (see image2).  A     normal appearing cecum, ileocecal valve, and appendiceal orifice     were identified. The ascending, hepatic flexure, transverse,     splenic flexure, descending, sigmoid colon, and rectum appeared     unremarkable (see image1 and image3).   Retroflexed views in the     rectum revealed no abnormalities.    The scope was then withdrawn     from the patient and the procedure completed.     COMPLICATIONS:  None           ENDOSCOPIC IMPRESSION:     1) Normal terminal ileum     2) Normal colon; normal rectum           RECOMMENDATIONS:     You should continue to follow colorectal cancer screening     guidelines for  "routine risk" patients with a repeat colonoscopy     in 10 years. There is no need for FOBT (stool) testing for at     least 5 years.     Unclear cause of her symptoms, which started at time of abrupt     cessation in periods.     Should consider gynecology referral.           REPEAT EXAM:  10 years           ______________________________     Rachael Fee, MD           n.     eSIGNED:   Rachael Fee at 08/01/2010 12:04 PM           Karlyne Greenspan, [phone]  Note: An exclamation mark (!) indicates a result that was not  dispersed into the flowsheet. Document Creation Date: 08/01/2010 12:04 PM _______________________________________________________________________  (1) Order result status: Final Collection or observation date-time: 08/01/2010 11:53 Requested date-time:  Receipt date-time:  Reported date-time:  Referring Physician:   Ordering Physician: Rob Bunting [phone]) Specimen Source:  Source: Launa Grill Order Number: [id number] Lab site:   Appended Document: Colonoscopy Burna Mortimer, with her cessation of periods and neg colonoscopy, pelvic ultrasound...what do you think about gyne consultation.  I saw the labs, all look normal.    dan  Appended Document: Colonoscopy    Clinical Lists Changes  Observations: Added new observation of COLONNXTDUE: 08/2020 (08/01/2010 12:54)

## 2010-08-08 NOTE — Progress Notes (Signed)
Summary: Period ?  Phone Note Call from Patient Call back at Home Phone [phone] Call back at [phone]   Caller: Patient Summary of Call: Pt is wanting to know why she is not having her periods? Could this because of the fibroids? Is it normal not to have periods? Initial call taken by: Romualdo Bolk, CMA Duncan Dull),  July 23, 2010 4:47 PM  Follow-up for Phone Call        would check  labs    TSH free T3 free T4 prolactin FSH,  LH   then follow up     we may need to get gyne to see her .   Follow-up by: Madelin Headings MD,  July 24, 2010 6:29 PM  Additional Follow-up for Phone Call Additional follow up Details #1::        Left message to call back. Additional Follow-up by: Romualdo Bolk, CMA Duncan Dull),  July 27, 2010 9:24 AM    Additional Follow-up for Phone Call Additional follow up Details #2::    appt scheduled Follow-up by: Romualdo Bolk, CMA (AAMA),  July 31, 2010 8:50 AM

## 2010-08-28 ENCOUNTER — Ambulatory Visit (INDEPENDENT_AMBULATORY_CARE_PROVIDER_SITE_OTHER)
Admission: RE | Admit: 2010-08-28 | Discharge: 2010-08-28 | Disposition: A | Discharge status: Home / Self Care | Payer: Managed Care, Other (non HMO) | Source: Ambulatory Visit | Attending: Internal Medicine | Admitting: Internal Medicine

## 2010-08-28 ENCOUNTER — Encounter: Payer: Self-pay | Admitting: Internal Medicine

## 2010-08-28 ENCOUNTER — Ambulatory Visit (INDEPENDENT_AMBULATORY_CARE_PROVIDER_SITE_OTHER): Payer: Managed Care, Other (non HMO) | Admitting: Internal Medicine

## 2010-08-28 DIAGNOSIS — N925 Other specified irregular menstruation: Secondary | ICD-10-CM

## 2010-08-28 DIAGNOSIS — IMO0001 Reserved for inherently not codable concepts without codable children: Secondary | ICD-10-CM

## 2010-08-28 DIAGNOSIS — R079 Chest pain, unspecified: Secondary | ICD-10-CM

## 2010-08-28 DIAGNOSIS — N949 Unspecified condition associated with female genital organs and menstrual cycle: Secondary | ICD-10-CM

## 2010-08-28 NOTE — Progress Notes (Signed)
  Subjective:    Patient ID: Margaret Howard, female    DOB: [birth year], 45 y.o.   MRN: [id number]  HPI Chest pain off and on since last week 6 days.  Middle  And then down arm  Taking prevacid .  No new meds and foods.  And down arms and to neck.  Walking dogs and waking in bed .   Not related to anything.   Very sharp  Quality of the pain not associated with shortness of breath nausea or vomiting it is somewhat severe. It is not related to neck pain. She has no unusual rashes with this.  No breast pain and has had a workup 4 possible axillary masses nothing of significance.    Had been taking ppi just in care from esophageal pain. BP has been good.  Review of Systems  no fever cough shortness of breath other change in health no trauma to her chest wall no weakness in her arms. She does have a history of bilateral carpal tunnel surgery. No leg symptoms currently    Objective:   Physical Exam  well-developed well-nourished in no acute distress well-groomed. She is nontoxic.  HEENT: Normocephalic ;atraumatic , Eyes;  PERRL, EOMs  Full, lids and conjunctiva clear,,Ears: no deformities, canals nl, TM landmarks normal, Nose: no deformity or discharge  Mouth : OP clear without lesion or edema . Neck without masses  And supple Chest:  Clear to A&P without wheezes rales or rhonchi  Non tender to palpation  CV:  S1-S2 no gallops or murmurs peripheral perfusion is normal Abdomen:  Sof,t normal bowel sounds without hepatosplenomegaly, no guarding rebound or masses no CVA tenderness Neuro cn seem intact  dtrs present u e and no focal atrophy or  Weakness.   No clubbing cyanosis or edema  EKG NSR  no acute changes       Assessment & Plan:  Chest pain  Atypical with arm radiation  anterior  Without associated symptoms . Unclear cause.  Character sounds like pinched nerve but distribution not typical although has had hx of  Ms and lumbar radicular symptoms .   Check ekg and cxray   Continue the  prevacid and add  antinflamm  For poss neuritis pain.

## 2010-08-28 NOTE — Patient Instructions (Addendum)
Unsure about cause of your pain but acts like  Pinched nerve pain in an unusual place Get chest x ray Add antiinflammatory to your prevacid   For now .  2 aleve bid  Monitor your blood pressure to ensure control.   Consider other eval depending on response and course of problem.

## 2010-08-29 ENCOUNTER — Emergency Department (HOSPITAL_COMMUNITY)
Admission: EM | Admit: 2010-08-29 | Discharge: 2010-08-30 | Disposition: A | Discharge status: Home / Self Care | Payer: Managed Care, Other (non HMO) | Attending: Emergency Medicine | Admitting: Emergency Medicine

## 2010-08-29 ENCOUNTER — Telehealth: Payer: Self-pay | Admitting: *Deleted

## 2010-08-29 DIAGNOSIS — R0609 Other forms of dyspnea: Secondary | ICD-10-CM | POA: Insufficient documentation

## 2010-08-29 DIAGNOSIS — Z79899 Other long term (current) drug therapy: Secondary | ICD-10-CM | POA: Insufficient documentation

## 2010-08-29 DIAGNOSIS — R0602 Shortness of breath: Secondary | ICD-10-CM | POA: Insufficient documentation

## 2010-08-29 DIAGNOSIS — R0989 Other specified symptoms and signs involving the circulatory and respiratory systems: Secondary | ICD-10-CM | POA: Insufficient documentation

## 2010-08-29 DIAGNOSIS — R209 Unspecified disturbances of skin sensation: Secondary | ICD-10-CM | POA: Insufficient documentation

## 2010-08-29 DIAGNOSIS — R079 Chest pain, unspecified: Secondary | ICD-10-CM | POA: Insufficient documentation

## 2010-08-29 DIAGNOSIS — I10 Essential (primary) hypertension: Secondary | ICD-10-CM | POA: Insufficient documentation

## 2010-08-29 LAB — POCT I-STAT, CHEM 8
Glucose, Bld: 92 mg/dL (ref 70–99)
HCT: 39 % (ref 36.0–46.0)
Hemoglobin: 13.3 g/dL (ref 12.0–15.0)
Potassium: 4.3 mEq/L (ref 3.5–5.1)
Sodium: 137 mEq/L (ref 135–145)

## 2010-08-29 LAB — POCT CARDIAC MARKERS
CKMB, poc: <1 ng/mL — ABNORMAL LOW (ref 1.0–8.0)
CKMB, poc: <1 ng/mL — ABNORMAL LOW (ref 1.0–8.0)

## 2010-08-29 NOTE — Telephone Encounter (Signed)
Message copied by Tor Netters on Wed Aug 29, 2010  2:40 PM ------      Message from: [facility], [place] K      Created: Wed Aug 29, 2010  1:38 PM       Tell patient  Chest x ray is normal

## 2010-08-29 NOTE — Telephone Encounter (Signed)
Left message to call back  

## 2010-08-30 DIAGNOSIS — R072 Precordial pain: Secondary | ICD-10-CM

## 2010-08-31 ENCOUNTER — Other Ambulatory Visit: Payer: Self-pay | Admitting: Internal Medicine

## 2010-08-31 ENCOUNTER — Encounter: Payer: Self-pay | Admitting: Internal Medicine

## 2010-08-31 DIAGNOSIS — R079 Chest pain, unspecified: Secondary | ICD-10-CM | POA: Insufficient documentation

## 2010-08-31 NOTE — Telephone Encounter (Signed)
Spoke to pt- seen at the ED and was keep. They done a stress test. They tried to rule out esophogeal. Pt states that she is in severe pain. They told her to take 2 aleve. It is worse when she moves. Per Dr. Fabian Sharp- ask pt if she is willing to do prednisone and hydrocodone. Pt is willing to do this.  [facility]

## 2010-08-31 NOTE — Telephone Encounter (Signed)
rx called in

## 2010-08-31 NOTE — Assessment & Plan Note (Signed)
Poss perimenopausal    period came this month  To see GYne  See phone notes

## 2010-08-31 NOTE — Assessment & Plan Note (Signed)
New type  Neg ekg  gety cxray  unsual presentatino and rx as if CW cause or neuritis.

## 2010-08-31 NOTE — Telephone Encounter (Signed)
Pt called and said that she needs to fup with Dr Fabian Sharp today re: chest pain. Pt went to hosp for 2 days. Pt had stress test. It is non heart related and not esophogeal. Pls call asap. Pt in extreme amount of pain. Says pain is off the charts.

## 2010-09-01 ENCOUNTER — Emergency Department (HOSPITAL_COMMUNITY)
Admission: EM | Admit: 2010-09-01 | Discharge: 2010-09-01 | Disposition: A | Discharge status: Home / Self Care | Payer: Managed Care, Other (non HMO) | Attending: Emergency Medicine | Admitting: Emergency Medicine

## 2010-09-01 ENCOUNTER — Emergency Department (HOSPITAL_COMMUNITY): Payer: Managed Care, Other (non HMO)

## 2010-09-01 DIAGNOSIS — IMO0001 Reserved for inherently not codable concepts without codable children: Secondary | ICD-10-CM | POA: Insufficient documentation

## 2010-09-01 DIAGNOSIS — R0989 Other specified symptoms and signs involving the circulatory and respiratory systems: Secondary | ICD-10-CM | POA: Insufficient documentation

## 2010-09-01 DIAGNOSIS — R0609 Other forms of dyspnea: Secondary | ICD-10-CM | POA: Insufficient documentation

## 2010-09-01 DIAGNOSIS — R1115 Cyclical vomiting syndrome unrelated to migraine: Secondary | ICD-10-CM | POA: Insufficient documentation

## 2010-09-01 DIAGNOSIS — Z79899 Other long term (current) drug therapy: Secondary | ICD-10-CM | POA: Insufficient documentation

## 2010-09-01 DIAGNOSIS — R079 Chest pain, unspecified: Secondary | ICD-10-CM | POA: Insufficient documentation

## 2010-09-01 DIAGNOSIS — R002 Palpitations: Secondary | ICD-10-CM | POA: Insufficient documentation

## 2010-09-01 DIAGNOSIS — I10 Essential (primary) hypertension: Secondary | ICD-10-CM | POA: Insufficient documentation

## 2010-09-01 LAB — COMPREHENSIVE METABOLIC PANEL
ALT: 20 U/L (ref 0–35)
BUN: 7 mg/dL (ref 6–23)
CO2: 24 mEq/L (ref 19–32)
Calcium: 9.6 mg/dL (ref 8.4–10.5)
Creatinine, Ser: 0.69 mg/dL (ref 0.4–1.2)
GFR calc non Af Amer: >60 mL/min (ref >60)
Glucose, Bld: 138 mg/dL — ABNORMAL HIGH (ref 70–99)

## 2010-09-01 LAB — DIFFERENTIAL
Lymphocytes Relative: 4 % — ABNORMAL LOW (ref 12–46)
Monocytes Absolute: 0.4 10*3/uL (ref 0.1–1.0)
Monocytes Relative: 2 % — ABNORMAL LOW (ref 3–12)
Neutro Abs: 23.8 10*3/uL — ABNORMAL HIGH (ref 1.7–7.7)

## 2010-09-01 LAB — CBC
HCT: 40.9 % (ref 36.0–46.0)
Hemoglobin: 14.8 g/dL (ref 12.0–15.0)
MCH: 33.6 pg (ref 26.0–34.0)
MCHC: 36.2 g/dL — ABNORMAL HIGH (ref 30.0–36.0)
MCV: 93 fL (ref 78.0–100.0)

## 2010-09-01 LAB — LIPASE, BLOOD: Lipase: 24 U/L (ref 11–59)

## 2010-09-03 ENCOUNTER — Ambulatory Visit (INDEPENDENT_AMBULATORY_CARE_PROVIDER_SITE_OTHER): Payer: Managed Care, Other (non HMO) | Admitting: Internal Medicine

## 2010-09-03 ENCOUNTER — Telehealth: Payer: Self-pay | Admitting: Gastroenterology

## 2010-09-03 ENCOUNTER — Encounter: Payer: Self-pay | Admitting: Internal Medicine

## 2010-09-03 ENCOUNTER — Ambulatory Visit (INDEPENDENT_AMBULATORY_CARE_PROVIDER_SITE_OTHER): Payer: Managed Care, Other (non HMO) | Admitting: Gastroenterology

## 2010-09-03 ENCOUNTER — Encounter: Payer: Self-pay | Admitting: Gastroenterology

## 2010-09-03 ENCOUNTER — Encounter (INDEPENDENT_AMBULATORY_CARE_PROVIDER_SITE_OTHER): Payer: Self-pay | Admitting: *Deleted

## 2010-09-03 DIAGNOSIS — R1310 Dysphagia, unspecified: Secondary | ICD-10-CM | POA: Insufficient documentation

## 2010-09-03 DIAGNOSIS — R079 Chest pain, unspecified: Secondary | ICD-10-CM

## 2010-09-03 DIAGNOSIS — R1319 Other dysphagia: Secondary | ICD-10-CM | POA: Insufficient documentation

## 2010-09-03 MED ORDER — RABEPRAZOLE SODIUM 20 MG PO TBEC: 20.0000 mg | DELAYED_RELEASE_TABLET | Freq: Every day | ORAL | Status: DC

## 2010-09-03 NOTE — Assessment & Plan Note (Signed)
MID CHEST PAIN WORSE OVER WEEKEND WITH NVHAS HAD NEG CARDS EVAL IN ED  W neg US abdomen ekg and labs ( except wbc elevation from  Prednisone.) her arms are better   And is now on sulcrafate  But cannot eat anything for pain and  Only on water at this point.    Hydration is stable today but needs to see GI this week    . Change  Prevacid to samples of aciphex and continue the sulcrafate.

## 2010-09-03 NOTE — Progress Notes (Signed)
  Subjective:    Patient ID: Margaret Howard, female    DOB: [birth year], 45 y.o.   MRN: [id number]  HPI  patient comes in today after being seen in the emergency room again the this time for chest pain nausea vomiting. She had laboratory studies done a chest x-ray abdominal ultrasound. These were all normal except for an elevated white blood cell count which was attributed to the prednisone that she had begun last week. Her previous evaluation had included a stress test and EKG. Her original pain was mid chest not associated with eating with radiating pain to both hands and arms. She had been on Prevacid when this began and we treated it for chest wall or perhaps atypical radiating musculoskeletal pain when her previous evaluation was negative.   However now it hurts to be in late down she was put on Zofran and Carafate in the emergency room she thinks the prednisone helped her pain and is about 70% better. She still states that she cannot eat because it hurts to swallow and eat or drink she has been mostly on water for the weekend and has lost 4 more pounds.   Review of Systems  see history of present illness no fever no diarrhea no lower abdominal pain.    Objective:   Physical Exam  well-developed well-nourished tired appearing somewhat flushed in no acute distress HEENT no acute changes neck without masses chest CTA cardiac S1-S2 no gallops or murmurs and I hear no rubs abdomen soft without organomegaly guarding or rebound no flank pain  Emergency room evaluation reviewed as well as her new medications.       Assessment & Plan:  chest pain midline   With episode of nausea vomiting and now dysphagia over the weekend. This occurred while she was on Prevacid. She never took the pain medication but did take prednisone which has helped her arm pain.   She has had a negative heart long evaluation at present. Concerned about severe esophagitis.  Change her Prevacid to AcipHex thank you at her to see  gastroenterology this week. Discussed strategies for avoiding dehydration.

## 2010-09-05 ENCOUNTER — Telehealth: Payer: Self-pay | Admitting: *Deleted

## 2010-09-05 ENCOUNTER — Ambulatory Visit: Payer: Managed Care, Other (non HMO) | Admitting: Internal Medicine

## 2010-09-05 MED ORDER — RABEPRAZOLE SODIUM 20 MG PO TBEC: 20.0000 mg | DELAYED_RELEASE_TABLET | Freq: Every day | ORAL | Status: DC

## 2010-09-05 MED ORDER — PREDNISONE 20 MG PO TABS: ORAL_TABLET | ORAL | Status: DC

## 2010-09-05 MED ORDER — HYDROCODONE-ACETAMINOPHEN 5-500 MG PO TABS: ORAL_TABLET | ORAL | Status: DC

## 2010-09-05 NOTE — Telephone Encounter (Signed)
Pt wants more samples or a rx on aciphex, She only got 3 tabs in the box. Per Dr. Fabian Sharp- can call in a 30 days supply and give her 2 boxes.

## 2010-09-06 ENCOUNTER — Telehealth: Payer: Self-pay | Admitting: *Deleted

## 2010-09-06 ENCOUNTER — Ambulatory Visit (HOSPITAL_COMMUNITY)
Admission: RE | Admit: 2010-09-06 | Discharge: 2010-09-06 | Disposition: A | Discharge status: Home / Self Care | Payer: Managed Care, Other (non HMO) | Source: Ambulatory Visit | Attending: Gastroenterology | Admitting: Gastroenterology

## 2010-09-06 ENCOUNTER — Other Ambulatory Visit: Payer: Self-pay | Admitting: Gastroenterology

## 2010-09-06 ENCOUNTER — Encounter: Payer: Managed Care, Other (non HMO) | Admitting: Gastroenterology

## 2010-09-06 DIAGNOSIS — R112 Nausea with vomiting, unspecified: Secondary | ICD-10-CM

## 2010-09-06 DIAGNOSIS — K2970 Gastritis, unspecified, without bleeding: Secondary | ICD-10-CM

## 2010-09-06 DIAGNOSIS — R079 Chest pain, unspecified: Secondary | ICD-10-CM

## 2010-09-06 DIAGNOSIS — K2990 Gastroduodenitis, unspecified, without bleeding: Secondary | ICD-10-CM

## 2010-09-06 DIAGNOSIS — R1310 Dysphagia, unspecified: Secondary | ICD-10-CM | POA: Insufficient documentation

## 2010-09-06 MED ORDER — HYDROCODONE-ACETAMINOPHEN 5-500 MG PO TABS: ORAL_TABLET | ORAL | Status: DC

## 2010-09-06 NOTE — Telephone Encounter (Signed)
Pt is going to have the endo done today at 2:30. She would like a refill on the hydrocodone called into [facility]. Pt also wants to know if she needs to come in for a follow up appt after the endo.

## 2010-09-06 NOTE — Telephone Encounter (Signed)
Rx called in 

## 2010-09-06 NOTE — Telephone Encounter (Signed)
Ok to refill x 1  And then fu in a few weeks  Let Gi opine on treatment for her chest pain.

## 2010-09-10 ENCOUNTER — Encounter: Payer: Self-pay | Admitting: Internal Medicine

## 2010-09-10 ENCOUNTER — Ambulatory Visit (INDEPENDENT_AMBULATORY_CARE_PROVIDER_SITE_OTHER): Payer: Managed Care, Other (non HMO) | Admitting: Internal Medicine

## 2010-09-10 DIAGNOSIS — R079 Chest pain, unspecified: Secondary | ICD-10-CM

## 2010-09-10 MED ORDER — CYCLOBENZAPRINE HCL 10 MG PO TABS: ORAL_TABLET | ORAL | Status: DC

## 2010-09-10 MED ORDER — PANTOPRAZOLE SODIUM 40 MG PO TBEC: 40.0000 mg | DELAYED_RELEASE_TABLET | Freq: Every day | ORAL | Status: DC

## 2010-09-10 NOTE — Assessment & Plan Note (Signed)
Atypical and some better  Not hurting to day.  Worse with activity. Had engd and showed gastritis and no lesions   Not felt to be causing her clinical picture.   Still otherwise.  with acitivity  although may be movement instead of aerobic stress .

## 2010-09-10 NOTE — Patient Instructions (Signed)
try protonix which is generic  In stead of the aciphex  to see if cost is better  [facility] to go back to work. Can add low dose flexeril at night Will schedule non urgent Ct of the chest   And consider cariology to see you for their opinion. return office visit in 3-4 weeks or prn.

## 2010-09-11 ENCOUNTER — Other Ambulatory Visit: Payer: Self-pay | Admitting: Gastroenterology

## 2010-09-11 DIAGNOSIS — R1011 Right upper quadrant pain: Secondary | ICD-10-CM

## 2010-09-11 NOTE — Assessment & Plan Note (Signed)
Review of gastrointestinal problems: 1.  full sensation in her rectum, 2012. colonoscopy February 2012 was normal to the terminal ileum. symptoms started at the same time as her menstrual periods stopped    History of Present Illness Visit Type: Follow-up Visit Primary GI MD: Rob Bunting MD Primary Provider: Berniece Andreas, MD Requesting Provider: Berniece Andreas, MD Chief Complaint: dysphagia, chest pain History of Present Illness:       pleasant 45 year old woman whom I last saw the time of her colonoscopy about a month ago. that was done for rectal fullness. She tells me that after the colonoscopy she had her menstrual cycle and her rectal fullness completely resolved.  who noticed about two weeks ago, chest pains.  the pains worsed. EKG and CRX were normal.  The pain went down left arm, she went to ER, admitted for overnight evaluation, cardiac stress test (negative).  Then started vomiting.  She had ultrasound over this past weekend, was told it was normal.    she vomited 3-6 times, non bloody.    started taking narcotic pain meds over the weekend but vomited preceeded the narcotics.  Eating or drinking can cause burning pain.     no antibiotics in past few months.  was put on prednisone a few days ago, after the vomiting,  done for musculo skeletal pain.  vomiting stopped a couple days ago.  She hasnt' eaten in a couple days either.  she is drinking water only.           Current Medications (verified): 1)  Metoprolol Succinate 100 Mg  Tb24 (Metoprolol Succinate) .... 1/2  By Mouth Once Daily or As Directed 2)  Cymbalta 30 Mg Cpep (Duloxetine Hcl) .Marland Kitchen.. 1 By Mouth Once Daily 3)  Hydrocodone-Acetaminophen 5-325 Mg Tabs (Hydrocodone-Acetaminophen) .Marland Kitchen.. 1 By Mouth  Every 4-6 Hours As Needed Severe Pain 4)  Aciphex 20 Mg Tbec (Rabeprazole Sodium) .... Once Daily 5)  Ondansetron Hcl 8 Mg Tabs (Ondansetron Hcl) .... Dissolve Under Tongue Every 4 To 6 Hours As Needed For Nausea and  Vomiting 6)  Carafate 1 Gm Tabs (Sucralfate) .... Take Four Times A Day Before Meals and At Bedtime  Allergies (verified): No Known Drug Allergies  Vital Signs:  Patient profile:   45 year old female Menstrual status:  regular Height:      61 inches Weight:      145.13 pounds BMI:     27.52 Pulse rate:   72 / minute Pulse rhythm:   regular BP sitting:   110 / 80  (left arm) Cuff size:   regular  Vitals Entered By: June McMurray CMA Duncan Dull) (September 03, 2010 1:42 PM)  Physical Exam  Additional Exam:  Constitutional: generally well appearing Psychiatric: alert and oriented times 3 Abdomen: soft, non-tender, non-distended, normal bowel sounds    Impression & Recommendations:  Problem # 1:   recent nausea, vomiting , chest pain  it is not clear that she has been having gastrointestinal issue. Or to be more clear is not certain that her chest pains are gastrointestinal related. If the EGD is the next step in evaluation which we will arrange to be done in the next several days. She will stand proton pump inhibitor daily until then.  Patient Instructions: 1)  EGD at [facility] this thursday.   2)  Stay on aciphex for now. 3)  A copy of this information will be sent to Dr. Fabian Sharp. 4)  The medication list was reviewed and reconciled.  All changed /  newly prescribed medications were explained.  A complete medication list was provided to the patient / caregiver.  Appended Document: Orders Update/EGD    Clinical Lists Changes  Problems: Added new problem of OTHER DYSPHAGIA (ZOX-096.04) Orders: Added new Test order of ZEGD (ZEGD) - Signed

## 2010-09-11 NOTE — Progress Notes (Signed)
Summary: triage  Phone Note From Other Clinic Call back at [phone]   Caller: Carollee Herter Dr Fabian Sharp nurse Call For: Dr Christella Hartigan Reason for Call: Schedule Patient Appt Summary of Call: Dr Fabian Sharp wants this patient seen today or tomorrow for severe dysphagia (choking on water)  Initial call taken by: Tawni Levy,  September 03, 2010 12:02 PM  Follow-up for Phone Call        Dr Christella Hartigan do you want to add this pt today, or get her in with the extender this week? Follow-up by: Chales Abrahams CMA Duncan Dull),  September 03, 2010 12:05 PM  Additional Follow-up for Phone Call Additional follow up Details #1::        i have an opening at 1:45.  If she cannot make that, then with extender today or tomorrow. Additional Follow-up by: Rachael Fee MD,  September 03, 2010 12:15 PM    Additional Follow-up for Phone Call Additional follow up Details #2::    pt added to schedule for today with Dr Christella Hartigan Follow-up by: Chales Abrahams CMA Duncan Dull),  September 03, 2010 12:28 PM

## 2010-09-11 NOTE — Procedures (Signed)
Summary: Preparation for Endoscopy / [facility]  Preparation for Endoscopy / [facility]   Imported By: Lennie Odor 09/06/2010 10:23:08  _____________________________________________________________________  External Attachment:    Type:   Image     Comment:   External Document

## 2010-09-11 NOTE — Letter (Signed)
Summary: EGD Instructions  [facility]  [address] [place], [place] [address]   Phone: [phone]  Fax: [phone]       Margaret Howard    06-25-1966    MRN: [id number]       Procedure Day /Date:09/06/10 [name]     Arrival Time: 1230 pm     Procedure Time:230 pm     Location of Procedure:                     [facility] ( Outpatient Registration)    PREPARATION FOR ENDOSCOPY   On 09/06/10  THE DAY OF THE PROCEDURE:   Nothing to eat or drink after midnight.   MEDICATION INSTRUCTIONS  Unless otherwise instructed, you should take regular prescription medications with a small sip of water as early as possible the morning of your procedure.               OTHER INSTRUCTIONS  You will need a responsible adult at least 47 years of age to accompany you and drive you home.   This person must remain in the waiting room during your procedure.  Wear loose fitting clothing that is easily removed.  Leave jewelry and other valuables at home.  However, you may wish to bring a book to read or an iPod/MP3 player to listen to music as you wait for your procedure to start.  Remove all body piercing jewelry and leave at home.  Total time from sign-in until discharge is approximately 2-3 hours.  You should go home directly after your procedure and rest.  You can resume normal activities the day after your procedure.  The day of your procedure you should not:   Drive   Make legal decisions   Operate machinery   Drink alcohol   Return to work  You will receive specific instructions about eating, activities and medications before you leave.    The above instructions have been reviewed and explained to me by   _______________________    I fully understand and can verbalize these instructions _____________________________ Date _________

## 2010-09-11 NOTE — Procedures (Signed)
Summary: Upper Endoscopy  Patient: Margaret Howard Note: All result statuses are Final unless otherwise noted.  Tests: (1) Upper Endoscopy (EGD)   EGD Upper Endoscopy       DONE     [facility]     [address] [place], [place]  [address]          ENDOSCOPY PROCEDURE REPORT          PATIENT:  Margaret Howard, Margaret Howard  MR#:  [id number]     BIRTHDATE:  [birth year], 44 yrs. old  GENDER:  female     ENDOSCOPIST:  Rachael Fee, MD     PROCEDURE DATE:  09/06/2010     PROCEDURE:  EGD with biopsy, 43239     ASA CLASS:  Class II     INDICATIONS:  chest pains, n/v, odynophagia; cardiac stress test     and CXR negative, abd [place] normal.     MEDICATIONS:  MAC sedation, administered by CRNA     TOPICAL ANESTHETIC:  Cetacaine Spray     DESCRIPTION OF PROCEDURE:   After the risks benefits and     alternatives of the procedure were thoroughly explained, informed     consent was obtained.  The EG-[id number] ([id number]) endoscope was     introduced through the mouth and advanced to the second portion of     the duodenum, without limitations.  The instrument was slowly     withdrawn as the mucosa was fully examined.     <<PROCEDUREIMAGES>>     There was mild to moderate, non-specific distal gastritis.     Biopsies taken to check for H. pylori (see image4).  Otherwise the     examination was normal (see image7, image6, image2, and image3).     Retroflexed views revealed no abnormalities.    The scope was then     withdrawn from the patient and the procedure completed.     COMPLICATIONS:  None     ENDOSCOPIC IMPRESSION:     1) Mild ot moderate gastritis; biopsied to check for H. pylori     2) Otherwise normal examination: normal esophagus          RECOMMENDATIONS:     Her biggest symptom is chest discomfort.  The mild to moderate     gastritis does not likely explain these symptoms however if     biopsies show H. pylori, appropriate antibiotics will be started.          If biopsies negative, then  will set up HIDA scan to check for     biliary dyskenesia (recent US was normal).     Should continue to consider non-GI causes of her chest pains     (musculoskeletal, cardiopulm, etc).     She will stay on once daily aciphex for now.          ______________________________     Rachael Fee, MD          cc: Berniece Andreas, MD          n.     eSIGNED:   Rachael Fee at 09/06/2010 11:03 AM          Karlyne Greenspan, [phone]  Note: An exclamation mark (!) indicates a result that was not dispersed into the flowsheet. Document Creation Date: 09/06/2010 11:04 AM _______________________________________________________________________  (1) Order result status: Final Collection or observation date-time: 09/06/2010 10:56 Requested date-time:  Receipt date-time:  Reported date-time:  Referring Physician:  Ordering Physician: Rob Bunting [id number]) Specimen Source:  Source: Launa Grill Order Number: [id number] Lab site:

## 2010-09-13 NOTE — Progress Notes (Signed)
  Subjective:    Patient ID: Margaret Howard, female    DOB: 12-Jun-1966, 45 y.o.   MRN: [id number]  HPI Patient comes in today for follow-up of her atypical severe chest pain. Since her last visit she did have her endoscopy per G.I. And there was no acute changes Except for some gastritis but was to remain on her acid blocker. Her AcipHex is very expensive that the pharmacy and wonders what she should do. Overall her pain is significantly better in she thinks that it's possible the prednisone did that. She denies shortness of breath or coughing present new weakness in her arms. She still gets problems that night and wonders if she should try some Flexeril.   Review of Systems Negative currently for shortness of breath elevated blood pressure headaches are stable no edema currently no numbness. Chest pain is middle of the chest and radiates down her arms.    Objective:   Physical Exam Well developed well-nourished in no acute distress looks much improved in previous heent grossly normal neck supple without masses or thyromegaly  LN neg  Has a tiny nodule debates of the left occiput nontender Chest:  Clear to A&P without wheezes rales or rhonchi CV:  S1-S2 no gallops or murmurs peripheral perfusion is normal Abdomen:  Sof,t normal bowel sounds without hepatosplenomegaly, no guarding rebound or masses no CVA tenderness Neuro non focal and No clubbing cyanosis or edema     reviewed   evaluation   Assessment & Plan:  Atypical chest pain improved It is unclear whether it this is better because of time anti-reflux medicine or prednisone and it is unclear what the pain has been from.  Probably not cardiac but there is risk will consider a chest CT to rule out any intrathoracic cause of his pain  Will change AcipHex to protonix of this is covered better and evaluate a follow-up

## 2010-09-14 ENCOUNTER — Other Ambulatory Visit: Payer: Self-pay | Admitting: *Deleted

## 2010-09-14 NOTE — Telephone Encounter (Signed)
Last filed on 09/06/10 #30

## 2010-09-18 ENCOUNTER — Other Ambulatory Visit: Payer: Self-pay | Admitting: *Deleted

## 2010-09-18 ENCOUNTER — Ambulatory Visit (INDEPENDENT_AMBULATORY_CARE_PROVIDER_SITE_OTHER)
Admission: RE | Admit: 2010-09-18 | Discharge: 2010-09-18 | Disposition: A | Discharge status: Home / Self Care | Payer: Managed Care, Other (non HMO) | Source: Ambulatory Visit | Attending: Internal Medicine | Admitting: Internal Medicine

## 2010-09-18 DIAGNOSIS — R079 Chest pain, unspecified: Secondary | ICD-10-CM

## 2010-09-18 HISTORY — DX: Essential (primary) hypertension: I10

## 2010-09-18 MED ORDER — IOHEXOL 300 MG/ML  SOLN
80.0000 mL | Freq: Once | INTRAMUSCULAR | Status: AC | PRN
  Administered 2010-09-18: 80 mL via INTRAVENOUS

## 2010-09-18 NOTE — Telephone Encounter (Signed)
Getting refill request for pt's hydrocodone. Rx was called in for #30 tabs on 09/06/10. I left a message for pt to call us back to let us know how she is doing and if she needs a refill on this.

## 2010-09-18 NOTE — Telephone Encounter (Signed)
Spoke to pt and she is still having some pain when she is walking. It goes down her arms. Pt is getting a CT done today and next week she has an appt with Dr. Christella Hartigan.

## 2010-09-19 MED ORDER — HYDROCODONE-ACETAMINOPHEN 5-500 MG PO TABS: ORAL_TABLET | ORAL | Status: DC

## 2010-09-19 NOTE — Telephone Encounter (Signed)
Per Dr. Panosh- okay x 1 

## 2010-09-20 ENCOUNTER — Telehealth: Payer: Self-pay | Admitting: *Deleted

## 2010-09-20 ENCOUNTER — Encounter: Payer: Self-pay | Admitting: Internal Medicine

## 2010-09-20 DIAGNOSIS — R911 Solitary pulmonary nodule: Secondary | ICD-10-CM | POA: Insufficient documentation

## 2010-09-20 NOTE — Telephone Encounter (Signed)
Message copied by Tor Netters on Thu Sep 20, 2010 11:28 AM ------      Message from: [facility], [place] K      Created: Thu Sep 20, 2010  9:33 AM       Tell patient chest CT shows normal lungs except for a small  Nodule on the left lower lung.       This should be followed up with a limited chest CT in 3 months to make sure it hasn't changed. 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    The osteophytes in the thoracic spine are what correlates with the shadowing on the plain x-ray.       If she is not getting better in another week or 2 then  rov    Consider having your orthopedist or spine specialist see you to see if this pain could be related 2 degenerative disc disease.

## 2010-09-20 NOTE — Telephone Encounter (Signed)
Pt aware of results and order placed for follow up CT

## 2010-09-21 ENCOUNTER — Other Ambulatory Visit: Payer: Self-pay | Admitting: Internal Medicine

## 2010-09-21 DIAGNOSIS — Z1231 Encounter for screening mammogram for malignant neoplasm of breast: Secondary | ICD-10-CM

## 2010-09-25 ENCOUNTER — Telehealth: Payer: Self-pay | Admitting: Gastroenterology

## 2010-09-25 NOTE — Telephone Encounter (Signed)
Pt needs to cx HIDA scan due to a death in her family, Sheralyn Boatman at [facility] radiology was notified and pt will call back to reschedule

## 2010-09-26 ENCOUNTER — Other Ambulatory Visit (HOSPITAL_COMMUNITY): Payer: Managed Care, Other (non HMO)

## 2010-10-02 ENCOUNTER — Ambulatory Visit: Payer: Managed Care, Other (non HMO)

## 2010-10-08 LAB — BASIC METABOLIC PANEL
Calcium: 9 mg/dL (ref 8.4–10.5)
GFR calc non Af Amer: >60 mL/min (ref >60)
Glucose, Bld: 104 mg/dL — ABNORMAL HIGH (ref 70–99)
Sodium: 135 mEq/L (ref 135–145)

## 2010-10-16 ENCOUNTER — Telehealth: Payer: Self-pay | Admitting: *Deleted

## 2010-10-16 DIAGNOSIS — M2550 Pain in unspecified joint: Secondary | ICD-10-CM

## 2010-10-16 NOTE — Telephone Encounter (Signed)
Pt called stating that she wants [place] to refer her to spine specialist. Still having problems with arthritis in back. Order sent to [facility].

## 2010-10-22 ENCOUNTER — Encounter: Payer: Self-pay | Admitting: Internal Medicine

## 2010-10-22 ENCOUNTER — Ambulatory Visit (INDEPENDENT_AMBULATORY_CARE_PROVIDER_SITE_OTHER): Payer: Managed Care, Other (non HMO) | Admitting: Internal Medicine

## 2010-10-22 VITALS — BP 120/80 | HR 60 | Temp 97.8°F | Wt 150.0 lb

## 2010-10-22 DIAGNOSIS — D1739 Benign lipomatous neoplasm of skin and subcutaneous tissue of other sites: Secondary | ICD-10-CM

## 2010-10-22 DIAGNOSIS — R079 Chest pain, unspecified: Secondary | ICD-10-CM

## 2010-10-22 DIAGNOSIS — IMO0002 Reserved for concepts with insufficient information to code with codable children: Secondary | ICD-10-CM

## 2010-10-22 DIAGNOSIS — M5135 Other intervertebral disc degeneration, thoracolumbar region: Secondary | ICD-10-CM

## 2010-10-22 DIAGNOSIS — D1720 Benign lipomatous neoplasm of skin and subcutaneous tissue of unspecified limb: Secondary | ICD-10-CM | POA: Insufficient documentation

## 2010-10-22 MED ORDER — PREDNISONE 20 MG PO TABS: ORAL_TABLET | ORAL | Status: DC

## 2010-10-22 MED ORDER — HYDROCODONE-ACETAMINOPHEN 5-500 MG PO TABS: ORAL_TABLET | ORAL | Status: DC

## 2010-10-22 NOTE — Progress Notes (Signed)
Subjective:    Patient ID: Margaret Howard, female    DOB: 06-05-1966, 45 y.o.   MRN: [id number]  HPI Patient comes in today for an acute visit because of problem with increasing size of mass that is tender on her left lateral thigh. She has a history of a number of lipomas that were [place] to her muscles and has had 2 removed one on each upper arm that resolved the pain that she had had at that time.  Unclear how long she has had some swelling on her left lateral thigh but it is now bothering her and tender. There is no redness or fever.  In the meantime she is having continued pain down her arms in front of her chest. We had done a full dilation and was not felt to be cardiac pulmonary or GI. She is known to have arthritis in her spine and she is to see [name] soon to see his advice. The prednisone had helped her.  But the pain is coming back now in severe.  The meantime her father died unexpectedly but it had a downhill travels since he broke his hip.   Past Medical History  Diagnosis Date  . Fibromyalgia     pos ana neg rheum  neuro  eye eval  . Foot fracture 2004  . Carpal tunnel syndrome of right wrist   . Hypertension    Past Surgical History  Procedure Date  . Spine surgery 1995    microdisecetomy and laminectomy   . Wrist surgery 12 30 2009    rt   CTS  . Left foot 12 30 2009    scar tissue  . Left wrist  02 2011    CTS   . Lipoma excision     x 2 upper arms    reports that she has never smoked. She does not have any smokeless tobacco history on file. Her alcohol and drug histories not on file. family history includes Breast cancer in her mother; Melanoma in her father and mother; Osteoporosis in her father; and Other in her father. No Known Allergies    Review of Systems Negative for fever or night sweats weight loss unintended numbness or weakness otherwise no cough blood pressure seems to be stable mood is stable. No unusual bleeding or bruising.    Objective:     Physical Exam Well-developed well-nourished in no acute distress. Affect is appropriate for situation Left leg: There is about a 6 inch superficial roundish lipomatous feeling area under her left lateral thigh area she states it is tender but is not fluctuant red or hot and she has good range of motion of that area. She has healed scars in her right upper left upper arm where her previous lipomas had been removed. Her gait is within normal limits .          Assessment & Plan:  Lump left thigh presumed lipoma seems really quite large.  I assume this is similar to her other lipomas. She unfortunately has pain related to them and they seemed to be attached to the muscles. The pain was relieved when the other 2 were removed. We discussed options and will refer back to Dr. Andrey Campanile preferred surgeon who she had her initial consultation.  Will refill the prednisone and hydrocodone for her arm chest pain that I think could be some type of neuritis related to degenerative disease in her thoracic spine.  She will keep her appointment with [name]. She  may require an MRI for further evaluation   .Marland Kitchen

## 2010-10-22 NOTE — Patient Instructions (Signed)
Will do a surgical referral consult about tender left thigh presumed lipoma possible lipoma. In the meantime we can restart the prednisone regarding your back arms chest pain possible neuritis. Keep her appointment with Dr. Shelle Iron.

## 2010-10-22 NOTE — Assessment & Plan Note (Signed)
Patient has had history of 2 painful lipoma in the arms that have been removed now she has one on her left lateral thigh which is much larger. We'll get surgical consult. Unknown reason why she is getting recurrent problems.

## 2010-10-24 ENCOUNTER — Ambulatory Visit
Admission: RE | Admit: 2010-10-24 | Discharge: 2010-10-24 | Disposition: A | Discharge status: Home / Self Care | Payer: Managed Care, Other (non HMO) | Source: Ambulatory Visit | Attending: Internal Medicine | Admitting: Internal Medicine

## 2010-10-24 DIAGNOSIS — Z1231 Encounter for screening mammogram for malignant neoplasm of breast: Secondary | ICD-10-CM

## 2010-10-25 ENCOUNTER — Telehealth: Payer: Self-pay | Admitting: *Deleted

## 2010-10-25 DIAGNOSIS — R911 Solitary pulmonary nodule: Secondary | ICD-10-CM

## 2010-10-25 NOTE — Telephone Encounter (Signed)
Order sent to [facility] 

## 2010-11-13 ENCOUNTER — Other Ambulatory Visit: Payer: Self-pay | Admitting: Internal Medicine

## 2010-11-13 NOTE — Op Note (Signed)
NAMELANIECE, HORNBAKER                  ACCOUNT NO.:  [id number]   MEDICAL RECORD NO.:  [id number]          PATIENT TYPE:  AMB   LOCATION:  DSC                          FACILITY:  [facility]   PHYSICIAN:  Almond Lint, MD       DATE OF BIRTH:  05-29-1966   DATE OF PROCEDURE:  12/13/2008  DATE OF DISCHARGE:                               OPERATIVE REPORT   PREOPERATIVE DIAGNOSIS:  Bilateral upper extremity masses.   POSTOPERATIVE DIAGNOSIS:  Bilateral upper extremity masses.   PROCEDURE PERFORMED:  Excision of bilateral upper extremity masses.   SURGEON:  Almond Lint, MD   ASSISTANT:  None.   ANESTHESIA:  General and local.   FINDINGS:  Fatty intramuscular mass on the left and fatty mass extending  to the fascia on the right.  The one on the left was 5 x 2 x 3, while on  the other side was 2 x 3 x 2 cm.   PROCEDURE IN DETAIL:  Ms. Wiens was identified in the holding area and  taken to the operating room where she was placed supine on the operating  room table.  General anesthesia was induced.  The left arm was prepped  and draped in a sterile fashion.  A time-out was performed according to  the surgical safety check list.  When all was correct, we continued.  The mass was oriented slightly obliquely and transversely on the left  upper arm.  The incision was made in a longitudinal aspect of this mass  and skin hooks were used to elevate the skin.  Skin flaps were created.  The mass was identified and elevated with an Allis.  It was very friable  and tore several times.  The mass did extend below the fascia into the  muscle.  Dissection was done sharply once we got down to near the vein.  The area did leave a defect.  There was not sufficient fatty tissue to  fill this defect.  The area was irrigated and hemostasis was achieved  with Bovie.  The skin was closed using 3-0 Vicryl and 4-0 Monocryl.  This was then dressed with Benzoin and Steri-Strips, gauze, and tape.   Attention was then  directed to the other arm.  This was prepped and  draped in a sterile fashion.  This one was much smaller, and a  longitudinal incision was made over the mass.  The skin flaps were  created with the assistance of skin hooks and the Bovie electrocautery.  The mass was elevated with an Allis.  Again, this was very friable being  fatty and tore into several pieces.  This one was extending to the  fascia, and the fascia overlying the muscle had to be  taken out one small area.  The area was then irrigated.  The cavity was  irrigated.  Hemostasis was achieved with a Bovie.  The area was injected  with local anesthetic.  The skin was cleaned, dried, and dressed with  Steri-Strips and 4 x 4's and taped.  The left arm is going to be placed  into a sling.      Almond Lint, MD  Electronically Signed     FB/MEDQ  D:  12/13/2008  T:  12/14/2008  Job:  [id number]

## 2010-11-23 ENCOUNTER — Encounter: Payer: Self-pay | Admitting: Internal Medicine

## 2010-11-23 ENCOUNTER — Ambulatory Visit (INDEPENDENT_AMBULATORY_CARE_PROVIDER_SITE_OTHER): Payer: Managed Care, Other (non HMO) | Admitting: Internal Medicine

## 2010-11-23 VITALS — BP 140/90 | HR 92 | Temp 97.8°F | Wt 157.0 lb

## 2010-11-23 DIAGNOSIS — IMO0002 Reserved for concepts with insufficient information to code with codable children: Secondary | ICD-10-CM

## 2010-11-23 DIAGNOSIS — I10 Essential (primary) hypertension: Secondary | ICD-10-CM

## 2010-11-23 DIAGNOSIS — R768 Other specified abnormal immunological findings in serum: Secondary | ICD-10-CM | POA: Insufficient documentation

## 2010-11-23 DIAGNOSIS — R894 Abnormal immunological findings in specimens from other organs, systems and tissues: Secondary | ICD-10-CM

## 2010-11-23 DIAGNOSIS — M25473 Effusion, unspecified ankle: Secondary | ICD-10-CM

## 2010-11-23 DIAGNOSIS — M2550 Pain in unspecified joint: Secondary | ICD-10-CM

## 2010-11-23 DIAGNOSIS — N949 Unspecified condition associated with female genital organs and menstrual cycle: Secondary | ICD-10-CM

## 2010-11-23 DIAGNOSIS — F411 Generalized anxiety disorder: Secondary | ICD-10-CM | POA: Insufficient documentation

## 2010-11-23 DIAGNOSIS — M5135 Other intervertebral disc degeneration, thoracolumbar region: Secondary | ICD-10-CM

## 2010-11-23 DIAGNOSIS — M25476 Effusion, unspecified foot: Secondary | ICD-10-CM

## 2010-11-23 MED ORDER — ALPRAZOLAM 0.25 MG PO TABS: 0.2500 mg | ORAL_TABLET | Freq: Three times a day (TID) | ORAL | Status: DC | PRN

## 2010-11-23 NOTE — Progress Notes (Signed)
Subjective:    Patient ID: Margaret Howard, female    DOB: 1965-12-20, 44 y.o.   MRN: [id number]  HPI Patient comes in today for an acute visit for multiple concerns but most problematic is her anxiety. He is having multiple medical issues but is having cervical spine surgery on June 13 by Dr. Shon Baton for radiculopathy that he feels is causing the pain down both arms. She still has severe anterior chest pain that could be related to this ulcer. She is becoming irritable and intolerable with her family not really having panic attacks but feels very anxious not depressed.  In addition she wonders if she really could have a nonunion disease. Years ago she was told she had fibromyalgia when she had joint pain and had a positive ANA test at that time. More recently she has had different joint pains ankle swelling albeit she did have a history of fractures in her feet. Lack of periods that she thinks is fairly abrupt and some questionable rashes and joint pains in the a.m.  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 Just finishing school year. Overall she believes that the pain is the most significant interfering .  She is currently on Norco as needed. In the past she was on Cymbalta for depression it may have helped some of her joints. She's been off of this over 6 months.  Review of Systems Negative shortness of breath cough major changes in hearing vision swollen glands fevers night sweats or weight loss. She has gained some weight recently. Swelling in her ankles in the morning down some now no redness. Hands in all joints tend to hurt.  Past history family history social history reviewed in the electronic medical record.      Objective:   Physical Exam     Wt Readings from Last 3 Encounters:  11/23/10 157 lb (71.215 kg)  10/22/10 150 lb (68.04 kg)  09/10/10 146 lb (66.225 kg)  Physical Exam: Vital signs  reviewed [id number] is a well-developed well-nourished alert cooperative  white female who appears her stated age in no acute distress.  HEENT: normocephalic  traumatic , Eyes: PERRL EOM's full, conjunctiva clear, Nares: paten,t no deformity discharge or tenderness., Ears: no deformity EAC's clear TMs with normal landmarks. Mouth: clear OP, no lesions, edema.  Moist mucous membranes. Dentition in adequate repair. NECK: supple without masses, thyromegaly or bruits. CHEST/PULM:  Clear to auscultation and percussion breath sounds equal no wheeze , rales or rhonchi. No chest wall deformities or tenderness. CV: PMI is nondisplaced, S1 S2 no gallops, murmurs, rubs. Peripheral pulses are full without delay.No JVD .  ABDOMEN: Bowel sounds normal nontender  No guard or rebound, no hepato splenomegal no CVA tenderness.  No hernia. Extremtities:  No clubbing cyanosis , no acute swelling or redness no focal atrophy however she does have +1 swelling in her ankle joint. NEURO:  Oriented x3, cranial nerves 3-12 appear to be intact, no obvious focal weakness,gait within normal limits  SKIN: No acute rashes normal turgor, color, no bruising or petechiae. Has facial erythema but not particularly a butterfly rash PSYCH: Oriented, good eye contact, no obvious depression anxiety, cognition and judgment appear normal.    Assessment & Plan:  Anxiety reactive. Discussed options risk benefits at present we'll use benzodiazepines short-term as needed call if it is not helpful. Chronic pain from her C-spine disc. To have surgical correction in June. Arthritis  arthralgias separate than above. She definitely has a history of degenerative joint disease but there may be other inflammatory disease possible. I agree we should revisit after she recovers from surgery. Blood pressure elevation borderline today probably from pain and apparently was normal at her dentist office. We'll monitor Amenorrhea  unclear if it is perimenopausal  her [facility]  and pregnancy tests was normal in the past  Incidental pulmonary nodule due for a followup CT in June   patient aware of need for followup.

## 2010-11-23 NOTE — Patient Instructions (Addendum)
I agree we should revisit the joint pain issue after your neck surgery. After you recovered from this I would like to refer you for rheumatology consultation. Can use antianxiety medicine in the meantime as needed. As we discussed.  Call if this is not helping in the meantime.  Check your blood pressure  occassionally.

## 2010-12-10 ENCOUNTER — Encounter (HOSPITAL_COMMUNITY)
Admission: RE | Admit: 2010-12-10 | Discharge: 2010-12-10 | Disposition: A | Discharge status: Home / Self Care | Payer: Managed Care, Other (non HMO) | Source: Ambulatory Visit | Attending: Orthopedic Surgery | Admitting: Orthopedic Surgery

## 2010-12-10 ENCOUNTER — Ambulatory Visit (HOSPITAL_COMMUNITY)
Admission: RE | Admit: 2010-12-10 | Discharge: 2010-12-10 | Disposition: A | Discharge status: Home / Self Care | Payer: Managed Care, Other (non HMO) | Source: Ambulatory Visit | Attending: Orthopedic Surgery | Admitting: Orthopedic Surgery

## 2010-12-10 ENCOUNTER — Other Ambulatory Visit (HOSPITAL_COMMUNITY): Payer: Self-pay | Admitting: Orthopedic Surgery

## 2010-12-10 DIAGNOSIS — M79603 Pain in arm, unspecified: Secondary | ICD-10-CM

## 2010-12-10 DIAGNOSIS — M542 Cervicalgia: Secondary | ICD-10-CM

## 2010-12-10 DIAGNOSIS — Z01812 Encounter for preprocedural laboratory examination: Secondary | ICD-10-CM | POA: Insufficient documentation

## 2010-12-10 DIAGNOSIS — Z01818 Encounter for other preprocedural examination: Secondary | ICD-10-CM | POA: Insufficient documentation

## 2010-12-10 DIAGNOSIS — Z01810 Encounter for preprocedural cardiovascular examination: Secondary | ICD-10-CM | POA: Insufficient documentation

## 2010-12-10 LAB — CBC
HCT: 38.6 % (ref 36.0–46.0)
Hemoglobin: 13.6 g/dL (ref 12.0–15.0)
RBC: 4.28 MIL/uL (ref 3.87–5.11)
RDW: 12.3 % (ref 11.5–15.5)
WBC: 6.3 10*3/uL (ref 4.0–10.5)

## 2010-12-10 LAB — SURGICAL PCR SCREEN: Staphylococcus aureus: NEGATIVE

## 2010-12-11 LAB — BASIC METABOLIC PANEL
CO2: 29 mEq/L (ref 19–32)
Calcium: 9.4 mg/dL (ref 8.4–10.5)
Chloride: 103 mEq/L (ref 96–112)
Potassium: 4.3 mEq/L (ref 3.5–5.1)
Sodium: 139 mEq/L (ref 135–145)

## 2010-12-12 ENCOUNTER — Inpatient Hospital Stay (HOSPITAL_COMMUNITY)
Admission: RE | Admit: 2010-12-12 | Discharge: 2010-12-13 | DRG: 473 | Disposition: A | Discharge status: Home / Self Care | Payer: Managed Care, Other (non HMO) | Source: Ambulatory Visit | Attending: Orthopedic Surgery | Admitting: Orthopedic Surgery

## 2010-12-12 ENCOUNTER — Ambulatory Visit (HOSPITAL_COMMUNITY): Payer: Managed Care, Other (non HMO)

## 2010-12-12 DIAGNOSIS — I10 Essential (primary) hypertension: Secondary | ICD-10-CM | POA: Diagnosis present

## 2010-12-12 DIAGNOSIS — K219 Gastro-esophageal reflux disease without esophagitis: Secondary | ICD-10-CM | POA: Diagnosis present

## 2010-12-12 DIAGNOSIS — Z01810 Encounter for preprocedural cardiovascular examination: Secondary | ICD-10-CM

## 2010-12-12 DIAGNOSIS — M47812 Spondylosis without myelopathy or radiculopathy, cervical region: Principal | ICD-10-CM | POA: Diagnosis present

## 2010-12-12 DIAGNOSIS — Z01812 Encounter for preprocedural laboratory examination: Secondary | ICD-10-CM

## 2010-12-12 DIAGNOSIS — Z87891 Personal history of nicotine dependence: Secondary | ICD-10-CM

## 2010-12-12 HISTORY — PX: ANTERIOR CERVICAL DISCECTOMY: SHX1160

## 2010-12-16 NOTE — Op Note (Signed)
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  OPERATIVE PROCEDURE:  Anterior cervical diskectomy and fusion 5-6, 6-7.  COMPLICATIONS:  None.  CONDITION:  Stable.  INSTRUMENTATION:  Synthes anterior vector plate 32 mm in length with 14- mm rescue screws into the body of C5, 14-mm variable screws into the bodies of 6 and 7.  Titan titanium intervertebral graft 7 small lordotic at 5-6, 8 small lordotic at 6-7; both packed with DBX mix.  COMPLICATIONS:  None.  CONDITION:  Stable.  ESTIMATED BLOOD LOSS:  Minimal blood loss. HISTORY:  This is a very pleasant 45 year old woman who has been having severe debilitating radicular arm pain and neck pain.  Attempts at conservative management have failed to alleviate her symptoms.  After discussing treatment options, she elected to proceed with surgery.  All appropriate risks, benefits, and alternatives were discussed with the patient and consent was obtained.  OPERATIVE NOTE:  The patient was brought to the operating room, placed supine on the operating table.  After successful induction of general anesthesia and endotracheal intubation, TEDs, SCDs were placed.  Rolled towels were placed between the shoulder blades.  Shoulders were taped down.  Anterior cervical spine was prepped and draped in standard fashion.  Appropriate time-out was done to confirm patient procedure, affected extremity, as well as all other pertinent important data.  Once this was completed, a transverse incision was made centered over the C6 vertebral  body.  This was a left-sided approach in a standard Clementeen Graham technique.  Sharp dissection was carried out down to the platysma, this was sharply incised.  I bluntly dissected down along the medial border of the sternocleidomastoid through the deep cervical and prevertebral fascia.  I isolated the omohyoid muscle and sacrificed it as it was hindering my visualization.  At this point, I could now palpate and visualize the anterior cervical spine.  A retractor was placed on the right-hand side to protect and retract the esophagus and trachea.  The carotid artery was identified and protected with a finger on the lateral side.  I mobilized the longus colli muscle from the midbody of the C5 to the midbody of C7 using bipolar electrocautery.  I then placed a needle into the 5-6 disk space and confirmed that I was at the appropriate level.  Once this was done and I had confirmed I was at the right level, I then placed distraction pins into the body of C6 and C7, placed my self-retaining retractors into the wound, deflated the endotracheal cuff, expanded the retractors, and reinflated the cuff.  A 15-blade scalpel was used to perform an annulotomy and then a combination of pituitary rongeurs, curettes, and Kerrison rongeurs were used to remove all the disk material.  I then used a  fine nerve hook to develop a plane underneath the posterior longitudinal ligament and then used a 1-mm Kerrison to remove the posterior longitudinal ligament.  I then trimmed down the osteophytes from the posterior aspect of the vertebral bodies.  At this point, I had an adequate decompression diskectomy.  I rasped the endplates to ensure I had bleeding subchondral bone, measured it with trial devices, and then packed with small 8 lordotic Titan titanium graft with DBX mixed with cortical cancellous and malleted the appropriate depth.  I then repositioned the distraction pins and repeated the diskectomy using the  same technique at C5-6. Again the posterior longitudinal ligament was sacrificed and the osteophytes were trimmed.  I had an adequate decompression, bleeding subchondral bone.  I measured the interbody space and placed 7 lordotic spacer.  I removed all distraction and contoured a 32-mm long plate and placed initially 16-mm screws into the body of C5, 14-mm screws into the bodies C6-7.  I was concerned that appeared to be just at the posterior cortex of the vertebral body of C5.  Because of this, I elected to remove them and I placed 14-mm rescue screws, which again still had excellent purchase.  The plate was secured, irrigated copiously with normal saline, obtained hemostasis using bipolar electrocautery, returned the trachea to check to ensure the esophagus did not become entrapped beneath the plate.  Once I confirmed this, I returned the trachea and esophagus to midline, closed the platysma with interrupted 2- 0 Vicryl sutures, and the skin with 3-0 Monocryl.  Steri-Strips, dry dressing, Aspen collar were applied.  The patient was extubated, transferred to the PACU without incident.  At the end of the case, all needle and sponge counts were correct.     Alvy Beal, MD     DDB/MEDQ  D:  12/12/2010  T:  12/13/2010  Job:  [id number]  cc:   [facility]  Electronically Signed by Venita Lick MD on 12/16/2010 07:23:22 AM

## 2010-12-28 ENCOUNTER — Telehealth: Payer: Self-pay | Admitting: *Deleted

## 2010-12-28 ENCOUNTER — Ambulatory Visit (INDEPENDENT_AMBULATORY_CARE_PROVIDER_SITE_OTHER)
Admission: RE | Admit: 2010-12-28 | Discharge: 2010-12-28 | Disposition: A | Discharge status: Home / Self Care | Payer: Managed Care, Other (non HMO) | Source: Ambulatory Visit | Attending: Internal Medicine | Admitting: Internal Medicine

## 2010-12-28 DIAGNOSIS — R911 Solitary pulmonary nodule: Secondary | ICD-10-CM

## 2010-12-28 DIAGNOSIS — J984 Other disorders of lung: Secondary | ICD-10-CM

## 2010-12-28 MED ORDER — IOHEXOL 300 MG/ML  SOLN
80.0000 mL | Freq: Once | INTRAMUSCULAR | Status: AC | PRN
  Administered 2010-12-28: 80 mL via INTRAVENOUS

## 2010-12-28 NOTE — Telephone Encounter (Signed)
Message copied by Romualdo Bolk on Fri Dec 28, 2010  3:56 PM ------      Message from: [facility] [place] [facility]      Created: Fri Dec 28, 2010  3:40 PM       Tell patient the pulm nodule is stable  And radiologist rec repeat in another 16-12 months . I think 12 months is ok  If nothing else changes.       Please arrange for this.

## 2010-12-28 NOTE — Telephone Encounter (Signed)
Pt aware of this and order placed in epic.

## 2011-01-15 ENCOUNTER — Encounter: Payer: Self-pay | Admitting: Internal Medicine

## 2011-01-15 ENCOUNTER — Ambulatory Visit (INDEPENDENT_AMBULATORY_CARE_PROVIDER_SITE_OTHER): Payer: Managed Care, Other (non HMO) | Admitting: Internal Medicine

## 2011-01-15 VITALS — BP 110/80 | HR 72 | Wt 152.0 lb

## 2011-01-15 DIAGNOSIS — N949 Unspecified condition associated with female genital organs and menstrual cycle: Secondary | ICD-10-CM

## 2011-01-15 DIAGNOSIS — R490 Dysphonia: Secondary | ICD-10-CM

## 2011-01-15 DIAGNOSIS — N912 Amenorrhea, unspecified: Secondary | ICD-10-CM | POA: Insufficient documentation

## 2011-01-15 DIAGNOSIS — M1990 Unspecified osteoarthritis, unspecified site: Secondary | ICD-10-CM

## 2011-01-15 DIAGNOSIS — R768 Other specified abnormal immunological findings in serum: Secondary | ICD-10-CM

## 2011-01-15 DIAGNOSIS — IMO0002 Reserved for concepts with insufficient information to code with codable children: Secondary | ICD-10-CM

## 2011-01-15 DIAGNOSIS — M129 Arthropathy, unspecified: Secondary | ICD-10-CM

## 2011-01-15 DIAGNOSIS — M5135 Other intervertebral disc degeneration, thoracolumbar region: Secondary | ICD-10-CM

## 2011-01-15 NOTE — Patient Instructions (Signed)
Will do a Rheum referral for your hand and feet joint pain .  Also gyne   For the poss perimenopausal issue.

## 2011-01-15 NOTE — Progress Notes (Signed)
  Subjective:    Patient ID: Margaret Howard, female    DOB: December 11, 1965, 45 y.o.   MRN: [id number]  HPI Patient comes in today for followup of medical problems and asks about possibility of referrals.  She had her Thoracic findings  surgery a month ago  with fusion anterior approach about a month ago.   Arm pain in  Gone. Has some residual spine pain but generally much better.    She did develop postop H oarseness  because had to manipulate larynx from surgery. This is getting some better but persists. Negative for other chest pain shortness of breath or cough.    Working in [place] recently feels good about this.  However she is having increasing joint pain of her hands and her feet that hurt at night in the morning. Some swelling no injury no redness no warmth.  Also she stating some hot flushes and still hasn't had a period see previous notes with a normal FSH.  Review of Systems No fever cough new injury.  Gi GU issues currently except as above. NO vision change  . BP better  Now   Less pain. Pulm nodule following.    Objective:   Physical Exam Well-developed well-nourished moderately hoarse with normal respiration and no acute distress Healing neck scar Chest:  Clear to A&P without wheezes rales or rhonchi CV:  S1-S2 no gallops or murmurs peripheral perfusion is normal Hands mild changes dip joints  Wrist look ok.  left ankle( had prev surgery there)  some swelling no redness or warmth.   Psych nl affect  Oriented no anxiety depression Neuro grossly normal. No obv motor deficit.        Assessment & Plan:  Arthritis Hands and feet swelling and stiffness in the setting of degenerative spine disease. Past history of ANA positive not felt to be significant at that time. Family history mom had arthritis at an elderly age. I  as well as [name] her orthopedist, think she should be reevaluated for rheumatologic disease. Will do a referral no blood work today.  Amenorrhea with some hot  flashes still acts like menopause although somewhat atypical had labs in the past with a normal FSH.  Status post thoracic fusion with significant improvement in her arm chest pain. Hoarseness secondary to surgery hopefully with expected to improve.  Okay to do GYN referral.

## 2011-02-05 ENCOUNTER — Telehealth: Payer: Self-pay | Admitting: *Deleted

## 2011-02-05 NOTE — Telephone Encounter (Signed)
Terri sent this back in April of this year to Dr. Andrey Campanile. I left a message for pt to call Dr. Tawana Scale office first to see if they got this info.

## 2011-02-05 NOTE — Telephone Encounter (Signed)
Pt is ready to have a referral to a general surgeon now for a mass thigh.  Dr. Fabian Sharp already saw her and told her she would do this when pt is ready.

## 2011-02-07 NOTE — Telephone Encounter (Signed)
Pt is going to call Dr. Tawana Scale office to see if they got the referral. Then if not, she will call us back.

## 2011-03-08 ENCOUNTER — Ambulatory Visit (INDEPENDENT_AMBULATORY_CARE_PROVIDER_SITE_OTHER): Payer: Managed Care, Other (non HMO) | Admitting: General Surgery

## 2011-03-08 ENCOUNTER — Encounter (INDEPENDENT_AMBULATORY_CARE_PROVIDER_SITE_OTHER): Payer: Self-pay | Admitting: General Surgery

## 2011-03-08 VITALS — BP 138/88 | HR 84 | Temp 97.4°F | Ht 61.5 in | Wt 150.0 lb

## 2011-03-08 DIAGNOSIS — D1739 Benign lipomatous neoplasm of skin and subcutaneous tissue of other sites: Secondary | ICD-10-CM

## 2011-03-08 DIAGNOSIS — D1720 Benign lipomatous neoplasm of skin and subcutaneous tissue of unspecified limb: Secondary | ICD-10-CM

## 2011-03-08 NOTE — Patient Instructions (Signed)

## 2011-03-08 NOTE — Progress Notes (Signed)
Chief Complaint  Patient presents with  . Lipoma    HPI  HPI   45 year old female referred by Dr. Fabian Sharp for evaluation of left thigh lipoma. I had previously seen the patient for a left axillary lesion. She has seen Dr. Donell Beers before for lipomas. Dr. Donell Beers has excised bilateral UE lipomas in 2010.  She states that the left hip lipoma has been there for about a year. It does cause her discomfort. She states that it is tender to touch. She sometimes has difficulty sitting on that side. She denies any unexplained weight loss. She denies any soft tissue cancer. She recently had an exacerbation of back pain for which she was put on a prednisone taper. Past Medical History  Diagnosis Date  . Fibromyalgia     pos ana neg rheum  neuro  eye eval  . Foot fracture 2004  . Carpal tunnel syndrome of right wrist   . Hypertension   . Positive ANA (antinuclear antibody)     Past Surgical History  Procedure Date  . Spine surgery 1995    microdisecetomy and laminectomy   . Wrist surgery 12 30 2009    rt   CTS  . Left foot 12 30 2009    scar tissue  . Left wrist  02 2011    CTS   . Lipoma excision     x 2 upper arms  . Anterior fusion thoracic spine 12/12/2010    had arm and chest pain    Family History  Problem Relation Age of Onset  . Melanoma Mother   . Breast cancer Mother     in her 52s   . Melanoma Father   . Other Father     oral  . Osteoporosis Father     hx of hip fracture and died from med problems after this.from MI    Social History History  Substance Use Topics  . Smoking status: Former Smoker    Quit date: 03/07/1986  . Smokeless tobacco: Never Used  . Alcohol Use: Yes     socially    No Known Allergies  Current Outpatient Prescriptions  Medication Sig Dispense Refill  . BLACK COHOSH PO Take by mouth daily.        . Lansoprazole (PREVACID PO) Take by mouth. otc       . predniSONE (STERAPRED UNI-PAK) 5 MG TABS       . ALPRAZolam (XANAX) 0.25 MG tablet Take  1 tablet (0.25 mg total) by mouth 3 (three) times daily as needed for anxiety.  40 tablet  1  . HYDROcodone-acetaminophen (VICODIN) 5-500 MG per tablet 1 every 4-6 hours as needed for pain.       . metoprolol (TOPROL-XL) 100 MG 24 hr tablet Take 100 mg by mouth daily. 1/2 tab daily       . pantoprazole (PROTONIX) 40 MG tablet TAKE 1 TABLET EVERY DAY  30 tablet  1    Review of Systems Review of Systems  Constitutional: Negative for fever, chills, activity change, appetite change and unexpected weight change.  HENT: Negative for nosebleeds.   Eyes: Negative for photophobia and visual disturbance.  Respiratory: Negative for chest tightness and shortness of breath.   Cardiovascular: Negative for chest pain and leg swelling.  Gastrointestinal: Negative for abdominal pain and abdominal distention.  Genitourinary: Negative for dysuria and hematuria.  Musculoskeletal: Negative for joint swelling.  Skin:       See hpi  Neurological: Negative for dizziness, tremors and weakness.  Hematological: Negative.   Psychiatric/Behavioral: Negative for suicidal ideas and agitation.    Blood pressure 138/88, pulse 84, temperature 97.4 F (36.3 C), temperature source Temporal, height 5' 1.5" (1.562 m), weight 150 lb (68.04 kg), last menstrual period 08/02/2010.  Physical Exam Physical Exam  Vitals reviewed. Constitutional: She is oriented to person, place, and time. She appears well-developed and well-nourished.       overweight  HENT:  Head: Normocephalic and atraumatic.  Eyes: Conjunctivae are normal. No scleral icterus.  Neck: Normal range of motion. Neck supple. No JVD present. No tracheal deviation present.    Cardiovascular: Normal rate, regular rhythm and normal heart sounds.   Pulmonary/Chest: Effort normal and breath sounds normal. She has no wheezes.  Abdominal: Soft. Bowel sounds are normal. There is no tenderness.  Musculoskeletal: She exhibits no edema.  Lymphadenopathy:    She has  no cervical adenopathy.  Neurological: She is alert and oriented to person, place, and time. She exhibits normal muscle tone.  Skin: Skin is warm and dry. No rash noted. No erythema.          Left hip/ ant thigh- no truly well defined lipomas,  There is soft fullness about the size of a walnut on left lat hip, no overlying skin changes; L ant thigh at same level as lateral hip lipoma is a more well defined soft tissue mass about 2cm, soft, nontender  Psychiatric: She has a normal mood and affect. Her behavior is normal. Judgment and thought content normal.    Data Reviewed Path from 2010 b/l upper extremities soft tissue mass excisions- lipoma. My last office note  Assessment    Left thigh lipomas x 2 HTN Fibromyalgia     Plan    We discussed the etiology of lipomas. We discussed observation versus excision. The patient is specifically interested in excision. There are 2 areas that are somewhat well-defined in the left hip area.  She was given education material. We discussed the risk and benefits of surgery including but not limited to bleeding, infection, injury to surrounding structures, hematoma formation, seroma formation, scarring, recurrence, and blood clot formation. We discussed the typical recovery course.  We will schedule her for excision of left thigh lipomas in the near future. I'd like to wait until she is off his prednisone taper before operating.       Gaynelle Adu M 03/08/2011, 2:32 PM

## 2011-04-12 LAB — DIFFERENTIAL
Basophils Absolute: 0
Eosinophils Absolute: 0.1
Eosinophils Relative: 2

## 2011-04-12 LAB — COMPREHENSIVE METABOLIC PANEL
ALT: 13
AST: 18
CO2: 29
Chloride: 105
Creatinine, Ser: 0.74
GFR calc Af Amer: >60
GFR calc non Af Amer: >60
Sodium: 141
Total Bilirubin: 0.8

## 2011-04-12 LAB — CBC
Hemoglobin: 14
MCV: 94.5
RBC: 4.21
WBC: 6.8

## 2011-04-19 ENCOUNTER — Ambulatory Visit (HOSPITAL_BASED_OUTPATIENT_CLINIC_OR_DEPARTMENT_OTHER)
Admission: RE | Admit: 2011-04-19 | Payer: Managed Care, Other (non HMO) | Source: Ambulatory Visit | Admitting: General Surgery

## 2011-05-09 IMAGING — US US ABDOMEN COMPLETE
1 series · 14 of 25 positions shown · non-contrast
Comparison: None.

CLINICAL DATA: Chest pain.

COMPLETE ABDOMINAL ULTRASOUND

[Series 1: us abdomen complete · 0.28mm/px · 14 of 62 slices shown]
[im 1/62]
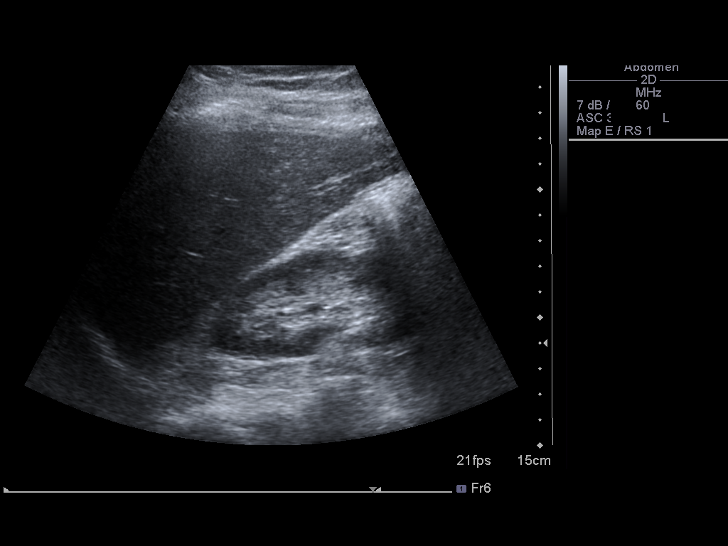
[im 6/62]
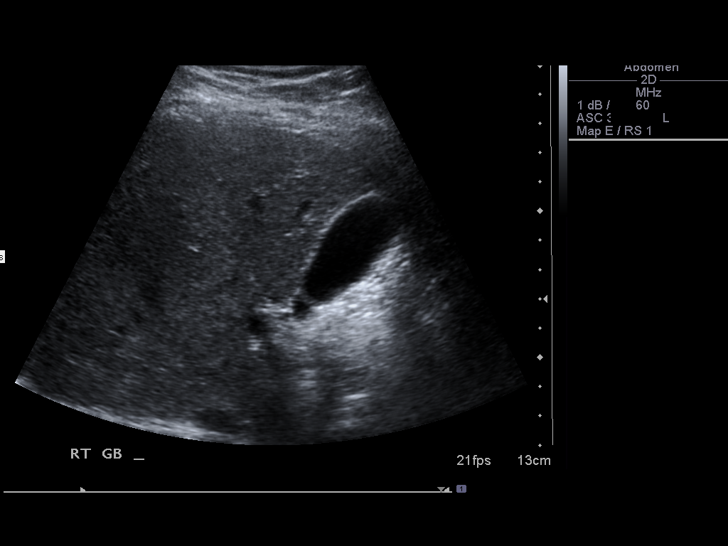
[im 11/62]
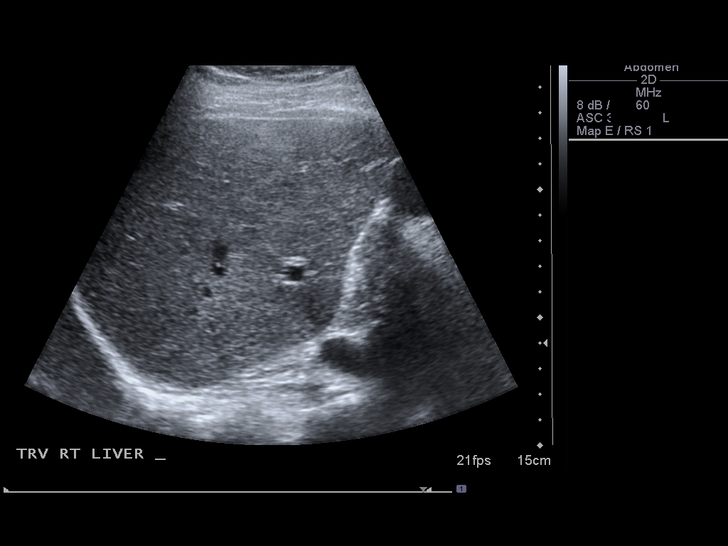
[im 16/62]
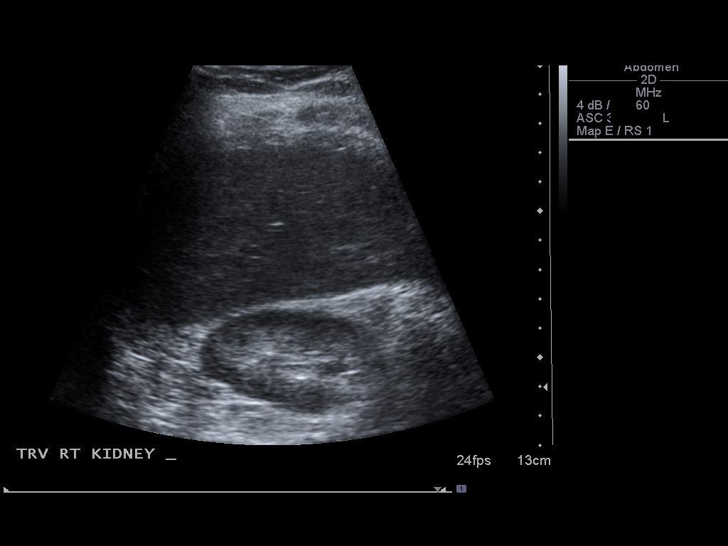
[im 21/62]
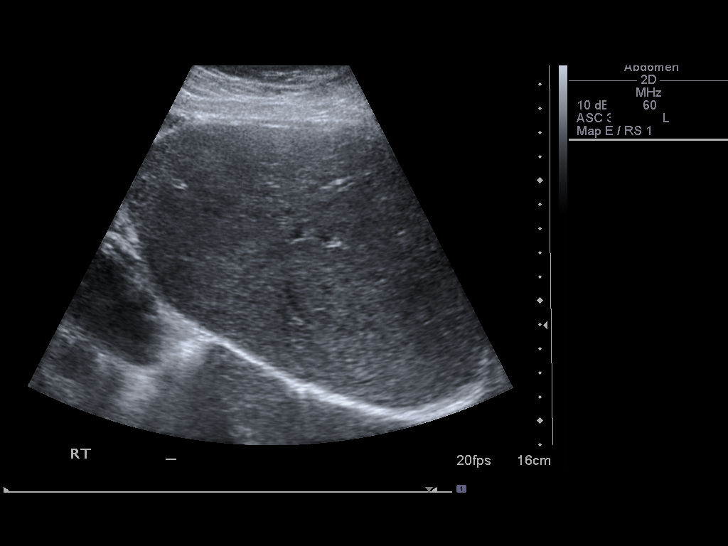
[im 23/62]
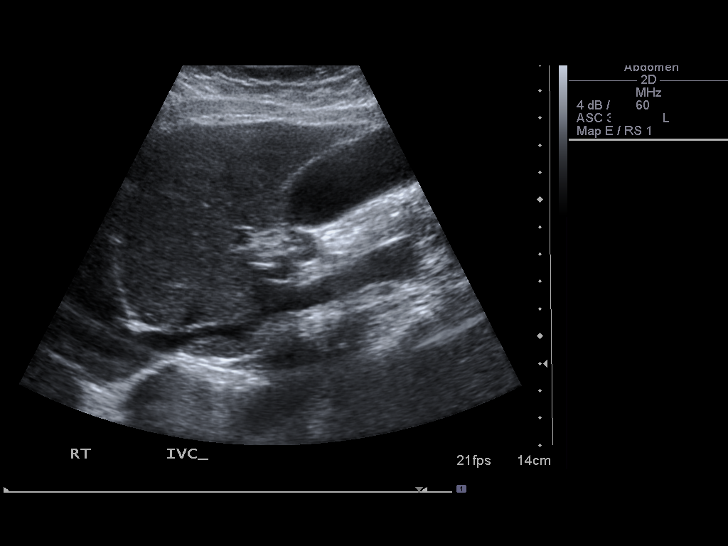
[im 28/62]
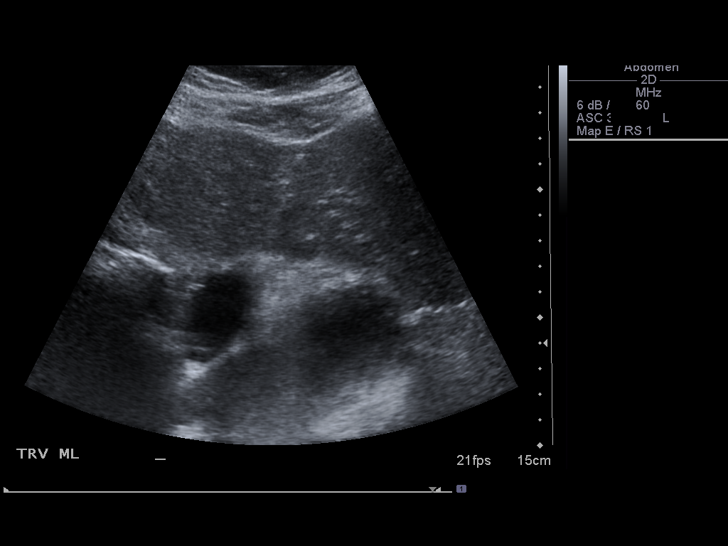
[im 34/62]
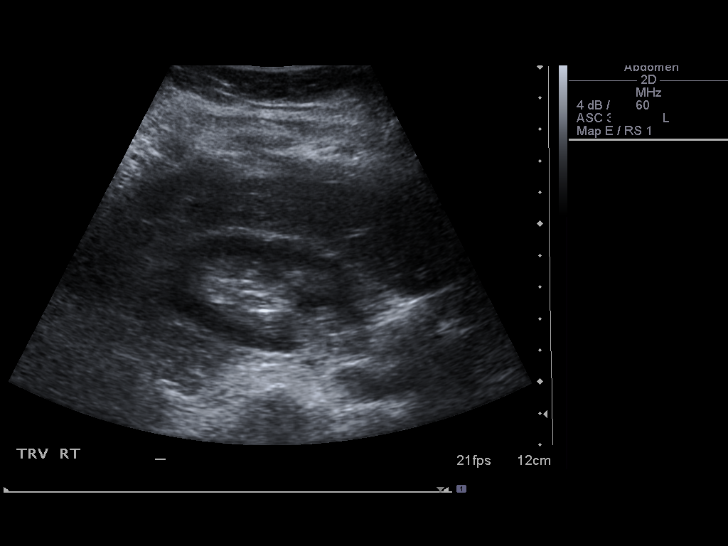
[im 39/62]
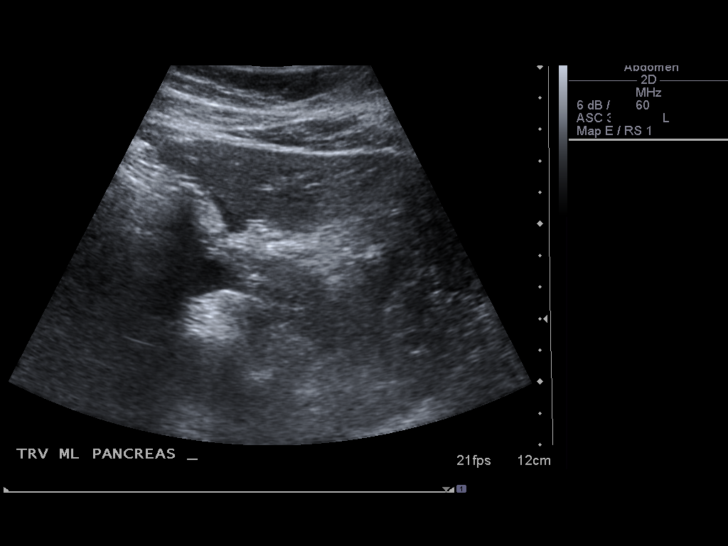
[im 41/62]
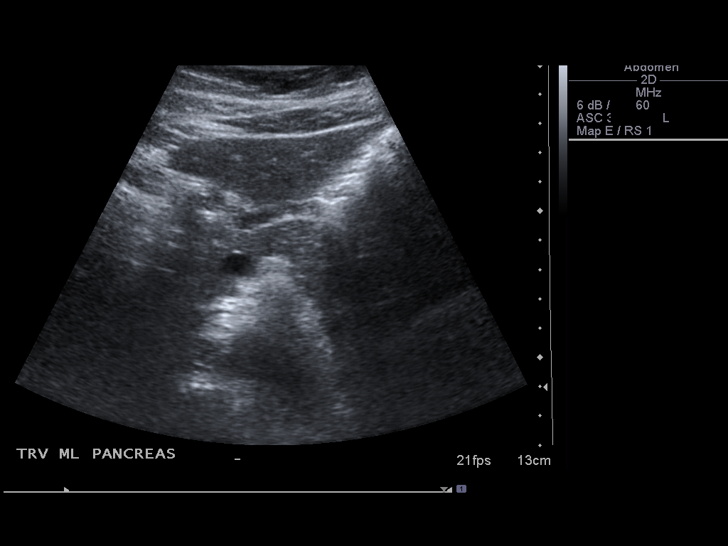
[im 46/62]
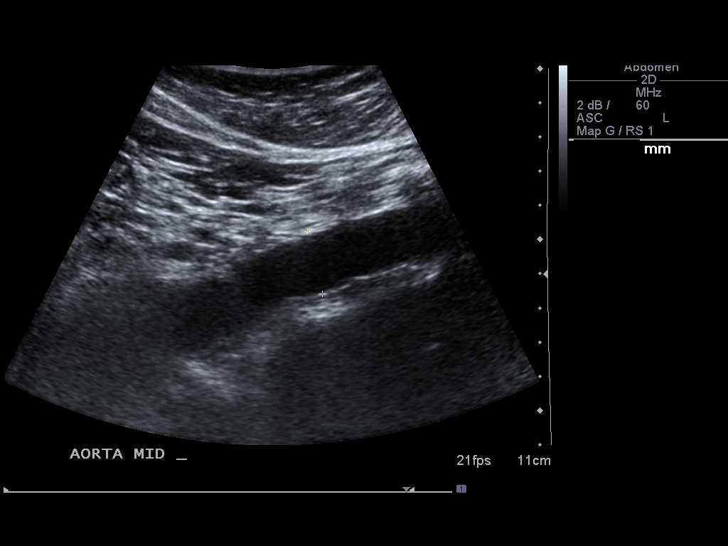
[im 51/62]
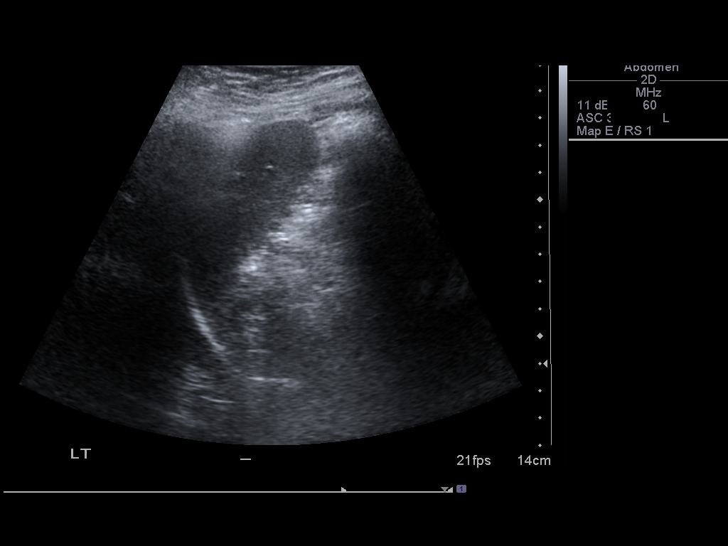
[im 56/62]
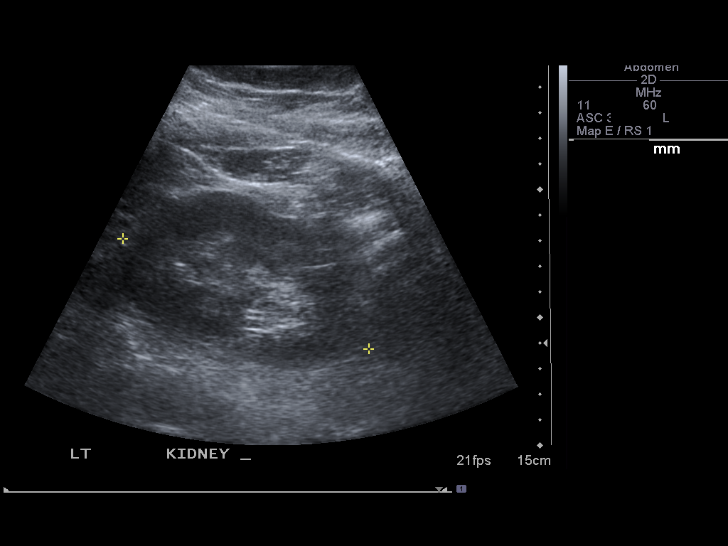
[im 62/62]
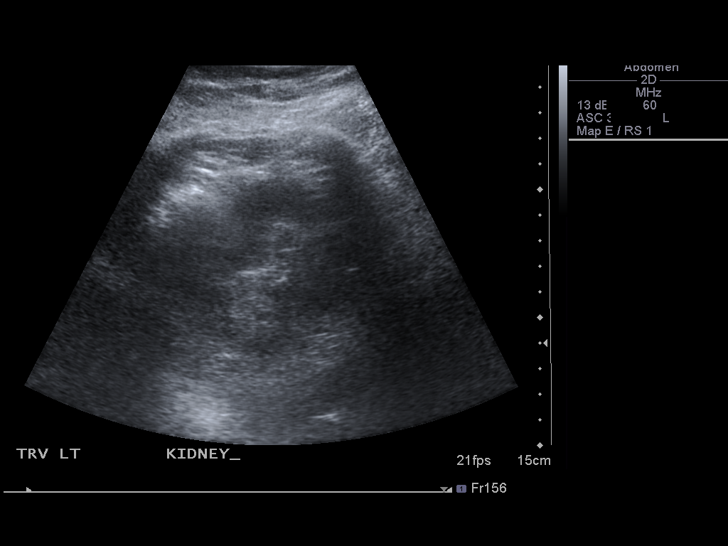

[14 of 25 positions shown; findings below may reference images not displayed]

FINDINGS: Gallbladder:  No shadowing gallstones or echogenic sludge.  No
gallbladder wall thickening or pericholecystic fluid.  No
sonographic Murphy's sign according to the ultrasound technologist.
Wall thickness is 2.0.

Common bile duct:  Normal in caliber. No biliary ductal dilation.
The maximal diameter is 3.8 mm, within normal limits.

Liver:  No focal lesion identified.  Within normal limits in
parenchymal echogenicity.

IVC:  Appears normal.

Pancreas:  No focal abnormality seen.

Spleen:  Normal size and echotexture without focal parenchymal
abnormality.  Maximal length is 5.7 cm

Right Kidney:  No hydronephrosis.  Well-preserved cortex.  Normal
size and parenchymal echotexture without focal abnormalities.  The
maximal length is 10.6 cm

Left Kidney:  No hydronephrosis.  Well-preserved cortex.  Normal
size and parenchymal echotexture without focal abnormalities.  The
maximal length is 10.6 cm

Abdominal aorta:  No aneurysm identified.
IMPRESSION: Negative abdominal ultrasound.

## 2011-05-15 ENCOUNTER — Other Ambulatory Visit: Payer: Self-pay | Admitting: Internal Medicine

## 2011-05-15 DIAGNOSIS — R911 Solitary pulmonary nodule: Secondary | ICD-10-CM

## 2011-05-16 ENCOUNTER — Other Ambulatory Visit: Payer: Self-pay | Admitting: Internal Medicine

## 2011-05-16 DIAGNOSIS — R911 Solitary pulmonary nodule: Secondary | ICD-10-CM

## 2011-05-27 ENCOUNTER — Inpatient Hospital Stay: Admission: RE | Admit: 2011-05-27 | Payer: Managed Care, Other (non HMO) | Source: Ambulatory Visit

## 2011-05-28 ENCOUNTER — Ambulatory Visit (INDEPENDENT_AMBULATORY_CARE_PROVIDER_SITE_OTHER)
Admission: RE | Admit: 2011-05-28 | Discharge: 2011-05-28 | Disposition: A | Discharge status: Home / Self Care | Payer: Managed Care, Other (non HMO) | Source: Ambulatory Visit | Attending: Internal Medicine | Admitting: Internal Medicine

## 2011-05-28 DIAGNOSIS — J984 Other disorders of lung: Secondary | ICD-10-CM

## 2011-05-28 DIAGNOSIS — R911 Solitary pulmonary nodule: Secondary | ICD-10-CM

## 2011-05-29 ENCOUNTER — Telehealth (INDEPENDENT_AMBULATORY_CARE_PROVIDER_SITE_OTHER): Payer: Self-pay | Admitting: General Surgery

## 2011-05-29 NOTE — Telephone Encounter (Signed)
Patient just put on new medicines from her rheumatologist and called to check what she should stop prior to surgery. Made her aware to stop Omega 3 and aspirin 7 days prior to surgery. Patient agrees and will call with any other problems.

## 2011-05-30 NOTE — Progress Notes (Signed)
Quick Note:  Left message to call back. ______ 

## 2011-05-31 ENCOUNTER — Other Ambulatory Visit: Payer: Self-pay | Admitting: Internal Medicine

## 2011-05-31 DIAGNOSIS — I708 Atherosclerosis of other arteries: Secondary | ICD-10-CM

## 2011-06-03 ENCOUNTER — Telehealth: Payer: Self-pay | Admitting: *Deleted

## 2011-06-03 NOTE — Telephone Encounter (Signed)
Pt is having lipoma's removed from her hip removed from her hip. She is wondering if this could affect her surgery.  Per Dr. Fabian Sharp- because she has no heart symptoms don't delay the surgery we will try to get her in before the surgery.   Pt aware of this.

## 2011-06-04 ENCOUNTER — Ambulatory Visit (INDEPENDENT_AMBULATORY_CARE_PROVIDER_SITE_OTHER): Payer: Managed Care, Other (non HMO) | Admitting: Cardiology

## 2011-06-04 ENCOUNTER — Encounter: Payer: Self-pay | Admitting: Cardiology

## 2011-06-04 DIAGNOSIS — I10 Essential (primary) hypertension: Secondary | ICD-10-CM

## 2011-06-04 DIAGNOSIS — R079 Chest pain, unspecified: Secondary | ICD-10-CM | POA: Insufficient documentation

## 2011-06-04 NOTE — Progress Notes (Signed)
 HPI The patient presents for evaluation of an abnormal chest CT demonstrating LAD calcification. She recently had this to evaluate a calcified lung nodule.  She does have a history of chest discomfort. This has been occurring for several months. She's actually been hospitalized. I did review these records. Most recently in March she ruled out for myocardial infarction. She did have a stress echocardiogram. However, she was unable to get to her target heart rate. She had some chest discomfort that was felt to be atypical. She achieved 77% of her target. There were no ischemic wall motion abnormalities. She had a well preserved ejection fraction. However, since that time she has continued to have chest discomfort. This has happened with activities such as walking a flight of stairs. She will stop what she is doing. The discomfort will go away after a minute. It is sharp. It is moderate to severe in intensity. It's her upper chest and not radiating to her neck or arms. She has not had this at rest. She thinks it might be slightly worse than previous. She doesn't describe any resting shortness of breath, PND or orthopnea. She's had no palpitations, presyncope or syncope. She's had no weight gain or edema.  No Known Allergies  Current Outpatient Prescriptions  Medication Sig Dispense Refill  . aspirin 81 MG chewable tablet Chew 81 mg by mouth daily.        . BLACK COHOSH PO Take by mouth daily.        . Ginger, Zingiber officinalis, (GINGER PO) Take by mouth.        . glucosamine-chondroitin 500-400 MG tablet Take 1 tablet by mouth 3 (three) times daily.        . HYDROcodone-acetaminophen (VICODIN) 5-500 MG per tablet 1 every 4-6 hours as needed for pain.       . Lansoprazole (PREVACID PO) Take by mouth. otc       . Misc Natural Products (TART CHERRY ADVANCED PO) Take by mouth.        . ALPRAZolam (XANAX) 0.25 MG tablet Take 1 tablet (0.25 mg total) by mouth 3 (three) times daily as needed for anxiety.   40 tablet  1  . methocarbamol (ROBAXIN) 500 MG tablet       . metoprolol (TOPROL-XL) 100 MG 24 hr tablet Take 100 mg by mouth daily. 1/2 tab daily       . pantoprazole (PROTONIX) 40 MG tablet TAKE 1 TABLET EVERY DAY  30 tablet  1  . predniSONE (STERAPRED UNI-PAK) 5 MG TABS         Past Medical History  Diagnosis Date  . Fibromyalgia     pos ana neg rheum  neuro  eye eval  . Foot fracture 2004  . Carpal tunnel syndrome of right wrist   . Hypertension   . Positive ANA (antinuclear antibody)     Past Surgical History  Procedure Date  . Spine surgery 1995    microdisecetomy and laminectomy   . Wrist surgery 12 30 2009    rt   CTS  . Left foot 12 30 2009    scar tissue  . Left wrist  02 2011    CTS   . Lipoma excision     x 2 upper arms  . Anterior fusion thoracic spine 12/12/2010    had arm and chest pain  . Colonoscopy   . Foot surgery     Right    Family History  Problem Relation Age of Onset  .   Melanoma Mother   . Breast cancer Mother     in her 40s   . Melanoma Father   . Other Father     oral  . Osteoporosis Father     hx of hip fracture and died from med problems after this.from MI  . Coronary artery disease Father 77    History   Social History  . Marital Status: Married    Spouse Name: N/A    Number of Children: N/A  . Years of Education: N/A   Occupational History  . Not on file.   Social History Main Topics  . Smoking status: Former Smoker -- 1.0 packs/day for 5 years    Types: Cigarettes    Quit date: 03/07/1986  . Smokeless tobacco: Never Used  . Alcohol Use: Yes     socially  . Drug Use: No  . Sexually Active: Not on file   Other Topics Concern  . 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  PHYSICAL EXAM BP 140/82  Pulse 87  Resp 18  Ht 5' 2" (1.575 m)  Wt  153 lb 12.8 oz (69.763 kg)  BMI 28.13 kg/m2  LMP 08/02/2010 GENERAL:  Well appearing HEENT:  Pupils equal round and reactive, fundi not visualized, oral mucosa unremarkable NECK:  No jugular venous distention, waveform within normal limits, carotid upstroke brisk and symmetric, no bruits, no thyromegaly LYMPHATICS:  No cervical, inguinal adenopathy LUNGS:  Clear to auscultation bilaterally BACK:  No CVA tenderness CHEST:  Unremarkable HEART:  PMI not displaced or sustained,S1 and S2 within normal limits, no S3, no S4, no clicks, no rubs, no murmurs ABD:  Flat, positive bowel sounds normal in frequency in pitch, no bruits, no rebound, no guarding, no midline pulsatile mass, no hepatomegaly, no splenomegaly EXT:  2 plus pulses throughout, no edema, no cyanosis no clubbing SKIN:  No rashes no nodules NEURO:  Cranial nerves II through XII grossly intact, motor grossly intact throughout PSYCH:  Cognitively intact, oriented to person place and time  EKG:  Sinus rhythm, rate 87, axis within normal limits, intervals within normal limits, no acute ST-T wave changes, poor anterior R wave progression  ASSESSMENT AND PLAN    

## 2011-06-04 NOTE — Assessment & Plan Note (Signed)
The patient has chest pain as described.  She now has the finding of coronary calcification. She was unable to complete an exercise stress test earlier this year. Further imaging is indicated. Coronary CT angiography would be an appropriate test for this patient. Further evaluation will be based on these results.

## 2011-06-04 NOTE — Assessment & Plan Note (Signed)
Her blood pressure is upper limits of normal. She will continue the medications as listed.

## 2011-06-04 NOTE — Patient Instructions (Signed)
Your physician recommends that you schedule a follow-up appointment in: as needed  Your physician has requested that you have cardiac CT. Cardiac computed tomography (CT) is a painless test that uses an x-ray machine to take clear, detailed pictures of your heart. For further information please visit [email or web address] Please follow instruction sheet as given.

## 2011-06-18 ENCOUNTER — Telehealth: Payer: Self-pay | Admitting: Cardiology

## 2011-06-18 ENCOUNTER — Encounter: Payer: Self-pay | Admitting: Family Medicine

## 2011-06-18 ENCOUNTER — Ambulatory Visit (INDEPENDENT_AMBULATORY_CARE_PROVIDER_SITE_OTHER): Payer: Managed Care, Other (non HMO) | Admitting: Family Medicine

## 2011-06-18 ENCOUNTER — Telehealth (INDEPENDENT_AMBULATORY_CARE_PROVIDER_SITE_OTHER): Payer: Self-pay | Admitting: General Surgery

## 2011-06-18 ENCOUNTER — Encounter (HOSPITAL_BASED_OUTPATIENT_CLINIC_OR_DEPARTMENT_OTHER): Payer: Self-pay | Admitting: *Deleted

## 2011-06-18 VITALS — BP 140/92 | HR 81 | Temp 97.7°F | Wt 151.0 lb

## 2011-06-18 DIAGNOSIS — Z01810 Encounter for preprocedural cardiovascular examination: Secondary | ICD-10-CM

## 2011-06-18 DIAGNOSIS — I10 Essential (primary) hypertension: Secondary | ICD-10-CM

## 2011-06-18 NOTE — Telephone Encounter (Signed)
OK for pt to take medications this day.

## 2011-06-18 NOTE — Pre-Procedure Instructions (Signed)
Hx. discussed with Dr. Jean Rosenthal; pt. will need cardiac CT before she can have surg.  Germaine at [facility] notified of same.

## 2011-06-18 NOTE — Telephone Encounter (Signed)
Per Dr Antoine Poche - sent insurance will not cover the Cardiac CT that the pt needs he will order a lexiscan myoview.  Order placed

## 2011-06-18 NOTE — Telephone Encounter (Signed)
Margaret Howard CALLED RE CT ANGIO OF HEART BEFORE SURGERY. I CONTACTED PT AND SHE THOUGHT IT WAS NOT NECESSARY. Margaret Howard SAID DR. HOCHREIN WOULD HAVE TO SEND AN OK NOTE FOR SURGERY. I WILL NOTIFY PT./GY

## 2011-06-18 NOTE — Telephone Encounter (Signed)
Pt was to have a heart ct, hip surgery Thursday was cancelled because ct was not done, she thinks insurance is the hold up, that they are not wanting to cover it, is there something else that would give the same result? pls call

## 2011-06-18 NOTE — Pre-Procedure Instructions (Signed)
Is to have cardiac CT - is waiting on approval from insurance co.

## 2011-06-18 NOTE — Progress Notes (Signed)
Subjective:    Patient ID: Margaret Howard, female    DOB: 02-11-1966, 45 y.o.   MRN: [id number]  HPI Ms. Bosque is a 45 year old white female, former smoker, in with complaints of elevated blood pressure. She has a history of hypertension and was taken off of her blood pressure medicine in January of 2012. Since then she's done well. She had her blood pressure checked at [facility] on Sunday and it was 170 systolically. She is worried that she will not be able to have surgery on Thursday choosing her blood pressure down. She denies any lightheadedness or dizziness, no chest pain, palpitations, shortness of breath, or edema.  Review of Systems As stated above    Past Medical History  Diagnosis Date  . Fibromyalgia     pos ana neg rheum  neuro  eye eval  . Foot fracture 2004  . Carpal tunnel syndrome of right wrist   . Hypertension   . Positive ANA (antinuclear antibody)     History   Social History  . Marital Status: Married    Spouse Name: N/A    Number of Children: N/A  . Years of Education: N/A   Occupational History  . Not on file.   Social History Main Topics  . Smoking status: Former Smoker -- 1.0 packs/day for 5 years    Types: Cigarettes    Quit date: 03/07/1986  . Smokeless tobacco: Never Used  . Alcohol Use: Yes     socially  . Drug Use: No  . Sexually Active: Not on file   Other Topics Concern  . 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 Spine surgery 1995    microdisecetomy and laminectomy   . Wrist surgery 12 30 2009    rt   CTS  . Left foot 12 30 2009    scar tissue  . Left wrist  02 2011    CTS   . Lipoma excision     x 2 upper arms  . Anterior fusion thoracic spine 12/12/2010    had arm and chest pain  . Colonoscopy   . Foot surgery     Right    Family History  Problem Relation Age of Onset  .  Melanoma Mother   . Breast cancer Mother     in her 73s   . Melanoma Father   . Other Father     oral  . Osteoporosis Father     hx of hip fracture and died from med problems after this.from MI  . Coronary artery disease Father 62    No Known Allergies  Current Outpatient Prescriptions on File Prior to Visit  Medication Sig Dispense Refill  . Lansoprazole (PREVACID PO) Take by mouth. otc       . ALPRAZolam (XANAX) 0.25 MG tablet Take 1 tablet (0.25 mg total) by mouth 3 (three) times daily as needed for anxiety.  40 tablet  1  . aspirin 81 MG chewable tablet Chew 81 mg by mouth daily.        Marland Kitchen BLACK COHOSH PO Take by mouth daily.        . Ginger, Zingiber officinalis, (GINGER PO) Take by mouth.        Marland Kitchen glucosamine-chondroitin 500-400 MG tablet Take 1 tablet by mouth 3 (three) times daily.        Marland Kitchen  HYDROcodone-acetaminophen (VICODIN) 5-500 MG per tablet 1 every 4-6 hours as needed for pain.       . methocarbamol (ROBAXIN) 500 MG tablet       . metoprolol (TOPROL-XL) 100 MG 24 hr tablet Take 100 mg by mouth daily. 1/2 tab daily       . Misc Natural Products (TART CHERRY ADVANCED PO) Take by mouth.        . pantoprazole (PROTONIX) 40 MG tablet TAKE 1 TABLET EVERY DAY  30 tablet  1  . predniSONE (STERAPRED UNI-PAK) 5 MG TABS         BP 140/92  Pulse 81  Temp(Src) 97.7 F (36.5 C) (Oral)  Wt 151 lb (68.493 kg)  LMP 02/02/2012chart Objective:   Physical Exam Blood pressure recheck: 142/88 Constitutional alert and oriented in no acute distress Lungs: Clear to auscultation Cardiac: Regular rate and rhythm Skin: Warm and dry Psychiatric: Normal mood and affect       Assessment & Plan:  Assessment: Hypertension  Plan: Dietary modifications encouraged, to include a low-sodium diet and increase in cardiovascular exercise. She will followup with Dr. Fabian Sharp as scheduled. Continue plans with surgery for Thursday. Recheck as needed

## 2011-06-20 ENCOUNTER — Encounter (HOSPITAL_BASED_OUTPATIENT_CLINIC_OR_DEPARTMENT_OTHER): Admission: RE | Payer: Self-pay | Source: Ambulatory Visit

## 2011-06-20 ENCOUNTER — Ambulatory Visit (HOSPITAL_COMMUNITY): Payer: Managed Care, Other (non HMO) | Attending: Cardiology | Admitting: Radiology

## 2011-06-20 ENCOUNTER — Ambulatory Visit (HOSPITAL_BASED_OUTPATIENT_CLINIC_OR_DEPARTMENT_OTHER)
Admission: RE | Admit: 2011-06-20 | Payer: Managed Care, Other (non HMO) | Source: Ambulatory Visit | Admitting: General Surgery

## 2011-06-20 DIAGNOSIS — Z01810 Encounter for preprocedural cardiovascular examination: Secondary | ICD-10-CM

## 2011-06-20 DIAGNOSIS — R079 Chest pain, unspecified: Secondary | ICD-10-CM

## 2011-06-20 DIAGNOSIS — Z01419 Encounter for gynecological examination (general) (routine) without abnormal findings: Secondary | ICD-10-CM | POA: Insufficient documentation

## 2011-06-20 HISTORY — DX: Unspecified osteoarthritis, unspecified site: M19.90

## 2011-06-20 HISTORY — DX: Gastro-esophageal reflux disease without esophagitis: K21.9

## 2011-06-20 HISTORY — DX: Benign lipomatous neoplasm of skin and subcutaneous tissue of unspecified limb: D17.20

## 2011-06-20 SURGERY — EXCISION LIPOMA
Anesthesia: Moderate Sedation | Laterality: Left

## 2011-06-20 MED ORDER — REGADENOSON 0.4 MG/5ML IV SOLN
0.4000 mg | Freq: Once | INTRAVENOUS | Status: AC
  Administered 2011-06-20: 0.4 mg via INTRAVENOUS

## 2011-06-20 MED ORDER — TECHNETIUM TC 99M TETROFOSMIN IV KIT
10.0000 | PACK | Freq: Once | INTRAVENOUS | Status: AC | PRN
  Administered 2011-06-20: 10 via INTRAVENOUS

## 2011-06-20 MED ORDER — TECHNETIUM TC 99M TETROFOSMIN IV KIT
30.0000 | PACK | Freq: Once | INTRAVENOUS | Status: AC | PRN
  Administered 2011-06-20: 30 via INTRAVENOUS

## 2011-06-20 NOTE — Progress Notes (Signed)
[facility] SITE 3 NUCLEAR MED [address] [place] [address] [phone]  Cardiology Nuclear Med Study  Margaret Howard is a 45 y.o. female [id number] 1966-02-17   Nuclear Med Background Indication for Stress Test:  Evaluation for Ischemia and Pending Surgical Clearance: Lipoma excision History: 05/16/11 CT: LAD Calicification and 08/30/10 Stress Echo: NL EF: 65% ([facility]) Cardiac Risk Factors: Family History - CAD, History of Smoking and Hypertension  Symptoms:  Chest Pain    Nuclear Pre-Procedure Caffeine/Decaff Intake:  None NPO After: 7:00pm   Lungs: clear IV 0.9% NS with Angio Cath:  22g  IV Site: R Antecubital  IV Started by:  Stanton Kidney, EMT-P  Chest Size (in):  40 Cup Size: D  Height: 5\' 1"  (1.549 m)  Weight:  150 lb (68.04 kg)  BMI:  Body mass index is 28.34 kg/(m^2). Tech Comments:  NA    Nuclear Med Study 1 or 2 day study: 1 day  Stress Test Type:  Treadmill/Lexiscan  Reading MD: Olga Millers, MD  Order Authorizing Provider:  J.Hochrein  Resting Radionuclide: Technetium 48m Tetrofosmin  Resting Radionuclide Dose: 11 mCi   Stress Radionuclide:  Technetium 31m Tetrofosmin  Stress Radionuclide Dose: 33 mCi           Stress Protocol Rest HR: 81 Stress HR: 131  Rest BP: 152/97 Stress BP: 158/97  Exercise Time (min): n/a METS: n/a   Predicted Max HR: 173 bpm % Max HR: 75.72 bpm Rate Pressure Product: 21308   Dose of Adenosine (mg):  n/a Dose of Lexiscan: 0.4 mg  Dose of Atropine (mg): n/a Dose of Dobutamine: n/a mcg/kg/min (at max HR)  Stress Test Technologist: Milana Na, EMT-P  Nuclear Technologist:  Doyne Keel, CNMT     Rest Procedure:  Myocardial perfusion imaging was performed at rest 45 minutes following the intravenous administration of Technetium 102m Tetrofosmin. Rest ECG: NSR  Stress Procedure:  The patient received IV Lexiscan 0.4 mg over 15-seconds with concurrent low level exercise and then Technetium 36m Tetrofosmin  was injected at 30-seconds while the patient continued walking one more minute.  There were non specific changes, sob, chest pain, and bilateral arm pain with Lexiscan.  Quantitative spect images were obtained after a 45-minute delay. Stress ECG: No significant ST segment change suggestive of ischemia.  QPS Raw Data Images:  Acquisition technically good; normal left ventricular size. Stress Images:  There is decreased uptake in the inferior wall. Rest Images:  Normal homogeneous uptake in all areas of the myocardium. Subtraction (SDS):  These findings are consistent with ischemia. Transient Ischemic Dilatation (Normal <1.22):  1.12 Lung/Heart Ratio (Normal <0.45):  .27   Quantitative Gated Spect Images QGS EDV:  70 ml QGS ESV:  22 ml QGS cine images:  NL LV Function; NL Wall Motion QGS EF: 69%  Impression Exercise Capacity:  Lexiscan with low level exercise. BP Response:  Normal blood pressure response. Clinical Symptoms:  There is chest pain. ECG Impression:  No significant ST segment change suggestive of ischemia. Comparison with Prior Nuclear Study: No images to compare  Overall Impression:  Abnormal stress nuclear study with a moderate to large reversible inferior defect consistent with moderate to severe inferior ischemia.  Olga Millers

## 2011-06-21 ENCOUNTER — Other Ambulatory Visit (INDEPENDENT_AMBULATORY_CARE_PROVIDER_SITE_OTHER): Payer: Managed Care, Other (non HMO) | Admitting: *Deleted

## 2011-06-21 ENCOUNTER — Encounter: Payer: Self-pay | Admitting: *Deleted

## 2011-06-21 ENCOUNTER — Telehealth: Payer: Self-pay | Admitting: Cardiology

## 2011-06-21 DIAGNOSIS — R9431 Abnormal electrocardiogram [ECG] [EKG]: Secondary | ICD-10-CM

## 2011-06-21 DIAGNOSIS — Z01810 Encounter for preprocedural cardiovascular examination: Secondary | ICD-10-CM

## 2011-06-21 LAB — CBC WITH DIFFERENTIAL/PLATELET
Basophils Absolute: 0 K/uL (ref 0.0–0.1)
Basophils Relative: 0.4 % (ref 0.0–3.0)
Eosinophils Absolute: 0.1 K/uL (ref 0.0–0.7)
Eosinophils Relative: 1.2 % (ref 0.0–5.0)
HCT: 40.3 % (ref 36.0–46.0)
Hemoglobin: 13.9 g/dL (ref 12.0–15.0)
Lymphocytes Relative: 27.7 % (ref 12.0–46.0)
Lymphs Abs: 2.2 K/uL (ref 0.7–4.0)
MCHC: 34.5 g/dL (ref 30.0–36.0)
MCV: 95 fl (ref 78.0–100.0)
Monocytes Absolute: 0.4 K/uL (ref 0.1–1.0)
Monocytes Relative: 5.2 % (ref 3.0–12.0)
Neutro Abs: 5.2 K/uL (ref 1.4–7.7)
Neutrophils Relative %: 65.5 % (ref 43.0–77.0)
Platelets: 328 K/uL (ref 150.0–400.0)
RBC: 4.25 Mil/uL (ref 3.87–5.11)
RDW: 12.6 % (ref 11.5–14.6)
WBC: 7.9 K/uL (ref 4.5–10.5)

## 2011-06-21 LAB — PROTIME-INR
INR: 1 ratio (ref 0.8–1.0)
Prothrombin Time: 10.9 s (ref 10.2–12.4)

## 2011-06-21 LAB — BASIC METABOLIC PANEL
BUN: 13 mg/dL (ref 6–23)
CO2: 28 mEq/L (ref 19–32)
Chloride: 106 mEq/L (ref 96–112)
Creatinine, Ser: 1 mg/dL (ref 0.4–1.2)
Glucose, Bld: 102 mg/dL — ABNORMAL HIGH (ref 70–99)

## 2011-06-21 MED ORDER — ASPIRIN 325 MG PO TBEC: 325.0000 mg | DELAYED_RELEASE_TABLET | Freq: Every day | ORAL | Status: DC

## 2011-06-21 MED ORDER — METOPROLOL SUCCINATE ER 50 MG PO TB24: 50.0000 mg | ORAL_TABLET | Freq: Every day | ORAL | Status: DC

## 2011-06-21 MED ORDER — ISOSORBIDE MONONITRATE ER 30 MG PO TB24: 30.0000 mg | ORAL_TABLET | Freq: Every day | ORAL | Status: DC

## 2011-06-21 NOTE — Telephone Encounter (Signed)
Fu call °Pt is returning your call °

## 2011-06-21 NOTE — Telephone Encounter (Signed)
Pt to be scheduled for cardiac cath in [facility] lab for 06/27/2011 - scheduled for 9:30 am.  Pt to come in for lab work prior to cath.

## 2011-06-21 NOTE — Telephone Encounter (Signed)
Pt aware to come for blood work.  She will come today.  Instructions and directions have been completed and pt knows to ask for them from [place] when she gets her lab work done.

## 2011-06-27 ENCOUNTER — Encounter (HOSPITAL_COMMUNITY)
Admission: AD | Disposition: A | Discharge status: Home / Self Care | Payer: Self-pay | Source: Ambulatory Visit | Attending: Cardiovascular Disease

## 2011-06-27 ENCOUNTER — Ambulatory Visit (HOSPITAL_COMMUNITY): Admit: 2011-06-27 | Payer: Self-pay | Admitting: Cardiovascular Disease

## 2011-06-27 ENCOUNTER — Encounter (HOSPITAL_BASED_OUTPATIENT_CLINIC_OR_DEPARTMENT_OTHER)
Admission: RE | Disposition: A | Discharge status: Hospice | Payer: Self-pay | Source: Ambulatory Visit | Attending: Cardiology

## 2011-06-27 ENCOUNTER — Observation Stay (HOSPITAL_COMMUNITY)
Admission: AD | Admit: 2011-06-27 | Discharge: 2011-06-28 | Disposition: A | Discharge status: Home / Self Care | Payer: Managed Care, Other (non HMO) | Source: Ambulatory Visit | Attending: Cardiovascular Disease | Admitting: Cardiovascular Disease

## 2011-06-27 ENCOUNTER — Inpatient Hospital Stay (HOSPITAL_BASED_OUTPATIENT_CLINIC_OR_DEPARTMENT_OTHER)
Admission: RE | Admit: 2011-06-27 | Discharge: 2011-06-27 | Disposition: A | Discharge status: Hospice | Payer: Managed Care, Other (non HMO) | Source: Ambulatory Visit | Attending: Cardiology | Admitting: Cardiology

## 2011-06-27 ENCOUNTER — Other Ambulatory Visit: Payer: Self-pay

## 2011-06-27 DIAGNOSIS — I2510 Atherosclerotic heart disease of native coronary artery without angina pectoris: Principal | ICD-10-CM | POA: Insufficient documentation

## 2011-06-27 DIAGNOSIS — I10 Essential (primary) hypertension: Secondary | ICD-10-CM | POA: Insufficient documentation

## 2011-06-27 DIAGNOSIS — R079 Chest pain, unspecified: Secondary | ICD-10-CM | POA: Insufficient documentation

## 2011-06-27 HISTORY — PX: FRACTIONAL FLOW RESERVE WIRE: SHX5839

## 2011-06-27 LAB — POCT ACTIVATED CLOTTING TIME
Activated Clotting Time: 243 seconds
Activated Clotting Time: 215 seconds
Activated Clotting Time: 182 seconds

## 2011-06-27 SURGERY — FRACTIONAL FLOW RESERVE WIRE
Anesthesia: LOCAL

## 2011-06-27 SURGERY — JV LEFT HEART CATHETERIZATION WITH CORONARY ANGIOGRAM
Anesthesia: Moderate Sedation

## 2011-06-27 MED ORDER — ROSUVASTATIN CALCIUM 20 MG PO TABS
20.0000 mg | ORAL_TABLET | Freq: Every day | ORAL | Status: DC
  Administered 2011-06-27: 20 mg via ORAL
  Filled 2011-06-27: qty 1

## 2011-06-27 MED ORDER — HEPARIN (PORCINE) IN NACL 2-0.9 UNIT/ML-% IJ SOLN
INTRAMUSCULAR | Status: AC
  Filled 2011-06-27: qty 2000

## 2011-06-27 MED ORDER — ONDANSETRON HCL 4 MG/2ML IJ SOLN: 4.0000 mg | Freq: Four times a day (QID) | INTRAMUSCULAR | Status: DC | PRN

## 2011-06-27 MED ORDER — ADENOSINE 12 MG/4ML IV SOLN
12.0000 mL | Freq: Once | INTRAVENOUS | Status: DC
  Filled 2011-06-27: qty 12

## 2011-06-27 MED ORDER — FENTANYL CITRATE 0.05 MG/ML IJ SOLN
INTRAMUSCULAR | Status: AC
  Filled 2011-06-27: qty 2

## 2011-06-27 MED ORDER — ASPIRIN 81 MG PO CHEW
81.0000 mg | CHEWABLE_TABLET | Freq: Every day | ORAL | Status: DC
  Administered 2011-06-28: 81 mg via ORAL
  Filled 2011-06-27: qty 1

## 2011-06-27 MED ORDER — ACETAMINOPHEN 325 MG PO TABS: 650.0000 mg | ORAL_TABLET | ORAL | Status: DC | PRN

## 2011-06-27 MED ORDER — MIDAZOLAM HCL 2 MG/2ML IJ SOLN
INTRAMUSCULAR | Status: AC
  Filled 2011-06-27: qty 2

## 2011-06-27 MED ORDER — SODIUM CHLORIDE 0.9 % IV SOLN: INTRAVENOUS | Status: DC

## 2011-06-27 MED ORDER — METOPROLOL SUCCINATE ER 50 MG PO TB24
50.0000 mg | ORAL_TABLET | Freq: Every day | ORAL | Status: DC
  Administered 2011-06-28: 50 mg via ORAL
  Filled 2011-06-27: qty 1

## 2011-06-27 MED ORDER — FENTANYL CITRATE 0.05 MG/ML IJ SOLN
25.0000 ug | Freq: Once | INTRAMUSCULAR | Status: AC
  Administered 2011-06-27: 25 ug via INTRAVENOUS

## 2011-06-27 MED ORDER — PANTOPRAZOLE SODIUM 40 MG PO TBEC: 40.0000 mg | DELAYED_RELEASE_TABLET | Freq: Every day | ORAL | Status: DC

## 2011-06-27 MED ORDER — HYDROCODONE-ACETAMINOPHEN 5-325 MG PO TABS: 1.0000 | ORAL_TABLET | Freq: Four times a day (QID) | ORAL | Status: DC | PRN

## 2011-06-27 MED ORDER — ISOSORBIDE MONONITRATE ER 30 MG PO TB24
30.0000 mg | ORAL_TABLET | Freq: Every day | ORAL | Status: DC
  Administered 2011-06-28: 30 mg via ORAL
  Filled 2011-06-27: qty 1

## 2011-06-27 MED ORDER — SODIUM CHLORIDE 0.9 % IV SOLN
INTRAVENOUS | Status: DC
  Administered 2011-06-27: 09:00:00 via INTRAVENOUS

## 2011-06-27 MED ORDER — NITROGLYCERIN 0.2 MG/ML ON CALL CATH LAB
INTRAVENOUS | Status: AC
  Filled 2011-06-27: qty 1

## 2011-06-27 MED ORDER — HEPARIN SODIUM (PORCINE) 1000 UNIT/ML IJ SOLN
INTRAMUSCULAR | Status: AC
  Filled 2011-06-27: qty 1

## 2011-06-27 MED ORDER — ZOLPIDEM TARTRATE 5 MG PO TABS
5.0000 mg | ORAL_TABLET | Freq: Every evening | ORAL | Status: DC | PRN
  Administered 2011-06-27: 5 mg via ORAL
  Filled 2011-06-27: qty 1

## 2011-06-27 MED ORDER — METHOCARBAMOL 500 MG PO TABS
500.0000 mg | ORAL_TABLET | Freq: Three times a day (TID) | ORAL | Status: DC | PRN
  Filled 2011-06-27: qty 1

## 2011-06-27 NOTE — H&P (View-Only) (Signed)
HPI The patient presents for evaluation of an abnormal chest CT demonstrating LAD calcification. She recently had this to evaluate a calcified lung nodule.  She does have a history of chest discomfort. This has been occurring for several months. She's actually been hospitalized. I did review these records. Most recently in March she ruled out for myocardial infarction. She did have a stress echocardiogram. However, she was unable to get to her target heart rate. She had some chest discomfort that was felt to be atypical. She achieved 77% of her target. There were no ischemic wall motion abnormalities. She had a well preserved ejection fraction. However, since that time she has continued to have chest discomfort. This has happened with activities such as walking a flight of stairs. She will stop what she is doing. The discomfort will go away after a minute. It is sharp. It is moderate to severe in intensity. It's her upper chest and not radiating to her neck or arms. She has not had this at rest. She thinks it might be slightly worse than previous. She doesn't describe any resting shortness of breath, PND or orthopnea. She's had no palpitations, presyncope or syncope. She's had no weight gain or edema.  No Known Allergies  Current Outpatient Prescriptions  Medication Sig Dispense Refill  . aspirin 81 MG chewable tablet Chew 81 mg by mouth daily.        Marland Kitchen BLACK COHOSH PO Take by mouth daily.        . Ginger, Zingiber officinalis, (GINGER PO) Take by mouth.        Marland Kitchen glucosamine-chondroitin 500-400 MG tablet Take 1 tablet by mouth 3 (three) times daily.        Marland Kitchen HYDROcodone-acetaminophen (VICODIN) 5-500 MG per tablet 1 every 4-6 hours as needed for pain.       . Lansoprazole (PREVACID PO) Take by mouth. otc       . Misc Natural Products (TART CHERRY ADVANCED PO) Take by mouth.        . ALPRAZolam (XANAX) 0.25 MG tablet Take 1 tablet (0.25 mg total) by mouth 3 (three) times daily as needed for anxiety.   40 tablet  1  . methocarbamol (ROBAXIN) 500 MG tablet       . metoprolol (TOPROL-XL) 100 MG 24 hr tablet Take 100 mg by mouth daily. 1/2 tab daily       . pantoprazole (PROTONIX) 40 MG tablet TAKE 1 TABLET EVERY DAY  30 tablet  1  . predniSONE (STERAPRED UNI-PAK) 5 MG TABS         Past Medical History  Diagnosis Date  . Fibromyalgia     pos ana neg rheum  neuro  eye eval  . Foot fracture 2004  . Carpal tunnel syndrome of right wrist   . Hypertension   . Positive ANA (antinuclear antibody)     Past Surgical History  Procedure Date  . Spine surgery 1995    microdisecetomy and laminectomy   . Wrist surgery 12 30 2009    rt   CTS  . Left foot 12 30 2009    scar tissue  . Left wrist  02 2011    CTS   . Lipoma excision     x 2 upper arms  . Anterior fusion thoracic spine 12/12/2010    had arm and chest pain  . Colonoscopy   . Foot surgery     Right    Family History  Problem Relation Age of Onset  .  Melanoma Mother   . Breast cancer Mother     in her 62s   . Melanoma Father   . Other Father     oral  . Osteoporosis Father     hx of hip fracture and died from med problems after this.from MI  . Coronary artery disease Father 59    History   Social History  . Marital Status: Married    Spouse Name: N/A    Number of Children: N/A  . Years of Education: N/A   Occupational History  . Not on file.   Social History Main Topics  . Smoking status: Former Smoker -- 1.0 packs/day for 5 years    Types: Cigarettes    Quit date: 03/07/1986  . Smokeless tobacco: Never Used  . Alcohol Use: Yes     socially  . Drug Use: No  . Sexually Active: Not on file   Other Topics Concern  . 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  PHYSICAL EXAM BP 140/82  Pulse 87  Resp 18  Ht 5\' 2"  (1.575 m)  Wt  153 lb 12.8 oz (69.763 kg)  BMI 28.13 kg/m2  LMP 08/02/2010 GENERAL:  Well appearing HEENT:  Pupils equal round and reactive, fundi not visualized, oral mucosa unremarkable NECK:  No jugular venous distention, waveform within normal limits, carotid upstroke brisk and symmetric, no bruits, no thyromegaly LYMPHATICS:  No cervical, inguinal adenopathy LUNGS:  Clear to auscultation bilaterally BACK:  No CVA tenderness CHEST:  Unremarkable HEART:  PMI not displaced or sustained,S1 and S2 within normal limits, no S3, no S4, no clicks, no rubs, no murmurs ABD:  Flat, positive bowel sounds normal in frequency in pitch, no bruits, no rebound, no guarding, no midline pulsatile mass, no hepatomegaly, no splenomegaly EXT:  2 plus pulses throughout, no edema, no cyanosis no clubbing SKIN:  No rashes no nodules NEURO:  Cranial nerves II through XII grossly intact, motor grossly intact throughout PSYCH:  Cognitively intact, oriented to person place and time  EKG:  Sinus rhythm, rate 87, axis within normal limits, intervals within normal limits, no acute ST-T wave changes, poor anterior R wave progression  ASSESSMENT AND PLAN

## 2011-06-27 NOTE — Progress Notes (Signed)
UP TO BATHROOM AND C/O RIGHT GROIN PAIN AND 3X5CM HEMATOMA FELT AND HELD PRESSURE RIGHT GROIN X AND STATES GROIN FEELS BETTER; PA CALLED AND IN TO SEE CLIENT

## 2011-06-27 NOTE — Interval H&P Note (Deleted)
History and Physical Interval Note:  The patient had an abnormal stress test suggesting anterior ischemia.  She continues to have chest pain.  06/27/2011 9:53 AM  Margaret Howard  has presented today for surgery, with the diagnosis of cp  The various methods of treatment have been discussed with the patient and family. After consideration of risks, benefits and other options for treatment, the patient has consented to  Procedure(s): JV LEFT HEART CATHETERIZATION WITH CORONARY ANGIOGRAM as a surgical intervention .  The patients' history has been reviewed, patient examined, no change in status, stable for surgery.  I have reviewed the patients' chart and labs.  Questions were answered to the patient's satisfaction.     [name]

## 2011-06-27 NOTE — Interval H&P Note (Signed)
History and Physical Interval Note:  06/27/2011 10:37 AM  Margaret Howard  has presented today for surgery, with the diagnosis of cp  The various methods of treatment have been discussed with the patient and family. After consideration of risks, benefits and other options for treatment, the patient has consented to  Procedure(s): JV LEFT HEART CATHETERIZATION WITH CORONARY ANGIOGRAM as a surgical intervention .  The patients' history has been reviewed, patient examined, no change in status, stable for surgery.  I have reviewed the patients' chart and labs.  Questions were answered to the patient's satisfaction.  Her recent stress perfusion study demonstrates a moderate sized inferior defect with infarct and moderate ischemia. She has had some ongoing chest pain.   [name]

## 2011-06-27 NOTE — Interval H&P Note (Signed)
History and Physical Interval Note:  06/27/2011 11:39 AM  The patient is here today for a FFR to assess severity of the stenosis in the LAD. I have personally reviewed the labs and examined the patient. The History and Physical has been reviewed and there are no changes. The risks and benefits of the procedure have been reviewed with the patient. Informed consent has been signed by the patient and is present in the chart. The patient agrees to proceed with the procedure.   Cylah Fannin

## 2011-06-27 NOTE — Progress Notes (Signed)
Transferred to main cath lab for flow wire procedure via stretcher and monitor.

## 2011-06-27 NOTE — Interval H&P Note (Signed)
History and Physical Interval Note:  06/27/2011 10:23 AM  Margaret Howard  has presented today for surgery, with the diagnosis of cp  The various methods of treatment have been discussed with the patient and family. After consideration of risks, benefits and other options for treatment, the patient has consented to  Procedure(s): JV LEFT HEART CATHETERIZATION WITH CORONARY ANGIOGRAM as a surgical intervention .  The patients' history has been reviewed, patient examined, no change in status, stable for surgery.  I have reviewed the patients' chart and labs.  Questions were answered to the patient's satisfaction.     [name]

## 2011-06-27 NOTE — Progress Notes (Signed)
Nurse called regarding pt in short stay s/p post elective cardiac catheterization earlier today. Completed 6-hours of bed rest and upon ambulation, experienced groin pain at the access site. Mass felt on palpation. On exam, pt's groin site is tender to palpation with mild swelling. 3x5 cm mass noted. No bruits, no exudate. Likely tender hematoma post-procedure. Will admit overnight to ensure hemostasis and no complications with groin site. Pt tearful, but understands and agrees. She will have additional 6 hours of bed rest, ice pack application for swelling with periodic reassessment of increased bleeding, swelling or exudate and evaluation upon ambulation. If stable, plan for discharge in AM.  R. Hurman Horn, PA-C 06/27/2011 7:23 PM

## 2011-06-27 NOTE — Procedures (Signed)
  Cardiac Catheterization Procedure Note  Name: Margaret Howard MRN: [id number] DOB: 1966-01-28  Procedure: Left Heart Cath, Selective Coronary Angiography, LV angiography  Indication:   Abnormal Lexiscan Myoview with inferior infarct and ischemia.  She has had ongoing chest pain.   Procedural details: The right groin was prepped, draped, and anesthetized with 1% lidocaine. Using modified Seldinger technique, a 5 French sheath was introduced into the right femoral artery. Standard Judkins catheters were used for coronary angiography and left ventriculography. Catheter exchanges were performed over a guidewire. There were no immediate procedural complications. The patient was transferred to the post catheterization recovery area for further monitoring.  Procedural Findings:  Hemodynamics:     AO 100/11    LV 98/63   Coronary angiography:  Coronary dominance: Right  Left mainstem:   Normal  Left anterior descending (LAD):   The LAD does not wrap the apex.  It is a moderate sized vessel.  There is proximal calcification.  There is mid 50 - 60% stenosis after a large septal.  Diag 1 is small and normal.  Diag 2 is large and normal.  Septal perforator is large with ostial 50% stenosis.   Left circumflex (LCx):  AV is moderate sized and normal.  OM1 large and normal.  OM2 moderate sized with luminal irregularities.  PL small and normal  Right coronary artery (RCA):  Large.  Occluded in the mid segment.  There is excellent collateral flow from the Circ and LAD.  Left ventriculography: Left ventricular systolic function is normal, LVEF is estimated at 55% with slight inferior hypokinesis.   There is no significant mitral regurgitation   Final Conclusions:  Occluded RCA and moderate LAD stenosis.  Preserved EF  Recommendations: I have reviewed the films with Dr. Clifton Ayrton Mcvay.  We plan a flow wire.  We need aggressive medical management.    [name] 06/27/2011, 10:39 AM

## 2011-06-27 NOTE — H&P (View-Only) (Signed)
 HPI The patient presents for evaluation of an abnormal chest CT demonstrating LAD calcification. She recently had this to evaluate a calcified lung nodule.  She does have a history of chest discomfort. This has been occurring for several months. She's actually been hospitalized. I did review these records. Most recently in March she ruled out for myocardial infarction. She did have a stress echocardiogram. However, she was unable to get to her target heart rate. She had some chest discomfort that was felt to be atypical. She achieved 77% of her target. There were no ischemic wall motion abnormalities. She had a well preserved ejection fraction. However, since that time she has continued to have chest discomfort. This has happened with activities such as walking a flight of stairs. She will stop what she is doing. The discomfort will go away after a minute. It is sharp. It is moderate to severe in intensity. It's her upper chest and not radiating to her neck or arms. She has not had this at rest. She thinks it might be slightly worse than previous. She doesn't describe any resting shortness of breath, PND or orthopnea. She's had no palpitations, presyncope or syncope. She's had no weight gain or edema.  No Known Allergies  Current Outpatient Prescriptions  Medication Sig Dispense Refill  . aspirin 81 MG chewable tablet Chew 81 mg by mouth daily.        . BLACK COHOSH PO Take by mouth daily.        . Ginger, Zingiber officinalis, (GINGER PO) Take by mouth.        . glucosamine-chondroitin 500-400 MG tablet Take 1 tablet by mouth 3 (three) times daily.        . HYDROcodone-acetaminophen (VICODIN) 5-500 MG per tablet 1 every 4-6 hours as needed for pain.       . Lansoprazole (PREVACID PO) Take by mouth. otc       . Misc Natural Products (TART CHERRY ADVANCED PO) Take by mouth.        . ALPRAZolam (XANAX) 0.25 MG tablet Take 1 tablet (0.25 mg total) by mouth 3 (three) times daily as needed for anxiety.   40 tablet  1  . methocarbamol (ROBAXIN) 500 MG tablet       . metoprolol (TOPROL-XL) 100 MG 24 hr tablet Take 100 mg by mouth daily. 1/2 tab daily       . pantoprazole (PROTONIX) 40 MG tablet TAKE 1 TABLET EVERY DAY  30 tablet  1  . predniSONE (STERAPRED UNI-PAK) 5 MG TABS         Past Medical History  Diagnosis Date  . Fibromyalgia     pos ana neg rheum  neuro  eye eval  . Foot fracture 2004  . Carpal tunnel syndrome of right wrist   . Hypertension   . Positive ANA (antinuclear antibody)     Past Surgical History  Procedure Date  . Spine surgery 1995    microdisecetomy and laminectomy   . Wrist surgery 12 30 2009    rt   CTS  . Left foot 12 30 2009    scar tissue  . Left wrist  02 2011    CTS   . Lipoma excision     x 2 upper arms  . Anterior fusion thoracic spine 12/12/2010    had arm and chest pain  . Colonoscopy   . Foot surgery     Right    Family History  Problem Relation Age of Onset  .   Melanoma Mother   . Breast cancer Mother     in her 40s   . Melanoma Father   . Other Father     oral  . Osteoporosis Father     hx of hip fracture and died from med problems after this.from MI  . Coronary artery disease Father 77    History   Social History  . Marital Status: Married    Spouse Name: N/A    Number of Children: N/A  . Years of Education: N/A   Occupational History  . Not on file.   Social History Main Topics  . Smoking status: Former Smoker -- 1.0 packs/day for 5 years    Types: Cigarettes    Quit date: 03/07/1986  . Smokeless tobacco: Never Used  . Alcohol Use: Yes     socially  . Drug Use: No  . Sexually Active: Not on file   Other Topics Concern  . 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  PHYSICAL EXAM BP 140/82  Pulse 87  Resp 18  Ht 5' 2" (1.575 m)  Wt  153 lb 12.8 oz (69.763 kg)  BMI 28.13 kg/m2  LMP 08/02/2010 GENERAL:  Well appearing HEENT:  Pupils equal round and reactive, fundi not visualized, oral mucosa unremarkable NECK:  No jugular venous distention, waveform within normal limits, carotid upstroke brisk and symmetric, no bruits, no thyromegaly LYMPHATICS:  No cervical, inguinal adenopathy LUNGS:  Clear to auscultation bilaterally BACK:  No CVA tenderness CHEST:  Unremarkable HEART:  PMI not displaced or sustained,S1 and S2 within normal limits, no S3, no S4, no clicks, no rubs, no murmurs ABD:  Flat, positive bowel sounds normal in frequency in pitch, no bruits, no rebound, no guarding, no midline pulsatile mass, no hepatomegaly, no splenomegaly EXT:  2 plus pulses throughout, no edema, no cyanosis no clubbing SKIN:  No rashes no nodules NEURO:  Cranial nerves II through XII grossly intact, motor grossly intact throughout PSYCH:  Cognitively intact, oriented to person place and time  EKG:  Sinus rhythm, rate 87, axis within normal limits, intervals within normal limits, no acute ST-T wave changes, poor anterior R wave progression  ASSESSMENT AND PLAN    

## 2011-06-27 NOTE — Progress Notes (Signed)
REPORT CALLED TO SHANNON,RN FOR [id number] AND TRANSFERRED TO 3735 VIA BED

## 2011-06-27 NOTE — Progress Notes (Addendum)
C/O RIGHT GROIN THROBBING AND PA HERE AND NOTIFIED I WILL HOLD PRESSURE X 15 MIN AND PRESSURE HELD X 15 MIN AND STATES RELIEF FROM THROBBING

## 2011-06-27 NOTE — Op Note (Signed)
Cardiac Catheterization Operative Report  Margaret Howard [id number] 12/27/201212:14 PM [name], MD  Procedure Performed:  1. Fractional flow reserve of the LAD    Operator: Verne Carrow, MD  Indication:  Pt with chest pain with exertion. Diagnostic cath per Dr. Antoine Poche today. Pt found to have chronically occluded RCA with good collaterals and moderate LAD stenosis. She is brought to the main cath lab from the outpatient cath lab for FFR of the LAD to assess lesion severity.                                    Procedure Details: The risks, benefits, complications, treatment options, and expected outcomes were discussed with the patient. The patient and/or family concurred with the proposed plan, giving informed consent for this procedure before her diagnostic cath. The patient was brought to the main cath lab. The patient was further sedated with Versed and Fentanyl. The right groin was prepped and draped in the usual manner. A 4 French sheath was present in the right femoral artery. Using sterile technique, the sheath was changed for a 6 French sheath over a wire. The patient was given 5000 units IV heparin. I engaged the left main artery with a 6 Fr XB LAD 3.5 guiding catheter. When the ACT was greater than 200, a pressure wire was advanced down the LAD. The wire was normalized in the left main. We then started an IV adenosine drip. Baseline FFR was 0.95. After infusion of IV adenosine for 2 minutes, her FFR was 0.87. This suggested that the stenosis in the mid LAD was not flow limiting. The guiding catheter and wire was removed. All catheter exchanges were over a wire.   There were no immediate complications. The patient was taken to the recovery area in stable condition.    Hemodynamic Findings: Central aortic pressure: 105/73  Fractional Flow Reserve: Baseline FFR=0.95. After IV adenosine, FFR=0.87.   Impression: 1. Moderate stenosis mid LAD. Not flow limiting based on  FFR.   Recommendations: Medical management. Will add Crestor 20 mg po QHS. Further management per Dr. Antoine Poche.        Complications:  None; patient tolerated the procedure well.

## 2011-06-28 DIAGNOSIS — I2510 Atherosclerotic heart disease of native coronary artery without angina pectoris: Secondary | ICD-10-CM

## 2011-06-28 MED ORDER — ROSUVASTATIN CALCIUM 20 MG PO TABS: 20.0000 mg | ORAL_TABLET | Freq: Every day | ORAL | Status: DC

## 2011-06-28 MED ORDER — NITROGLYCERIN 0.4 MG SL SUBL: 0.4000 mg | SUBLINGUAL_TABLET | SUBLINGUAL | Status: DC | PRN

## 2011-06-28 NOTE — Discharge Summary (Signed)
Discharge Summary   Patient ID: Margaret Howard,  MRN: [id number], DOB/AGE: 02-Mar-1966 45 y.o.  Admit date: 06/27/2011 Discharge date: 06/28/2011  Discharge Diagnoses Principal Problem:  *CAD (coronary artery disease) Active Problems:  HYPERTENSION   Allergies No Known Allergies  Procedures  Cardiac catheterization  Hemodynamic Findings:  Central aortic pressure: 105/73  Fractional Flow Reserve: Baseline FFR=0.95. After IV adenosine, FFR=0.87.  Impression:  1. Moderate stenosis mid LAD. Not flow limiting based on FFR.  Recommendations: Medical management. Will add Crestor 20 mg po QHS. Further management per Dr. Antoine Poche.  Complications: None; patient tolerated the procedure well.   History of Present Illness  Pt had elective cardiac catheterization yesterday for chest pain on exertion revealing the above results including mid LAD stenosis. Plan is for medical management.   Hospital Course   She tolerated the procedure well. While in short stay, she developed small groin hematoma at cath site after 6 hours bed rest. Tender on palpation and upon ambulating. She was admitted overnight to ensure hemostasis and reevaluation of groin site after an additional 6 hours of bed rest with ice pack application.   This morning, the swelling has decreased. There is no evidence of erythema, exudate, bleeding or bruit. She ambulated in the hall today with assistance. Asymptomatic. Catheterization site without swelling, bleeding or increased tenderness. She is in good condition and will be discharged today.    Discharge Vitals:  Blood pressure 100/66, pulse 71, temperature 98.3 F (36.8 C), temperature source Oral, resp. rate 18, height 5\' 1"  (1.549 m), weight 68.05 kg (150 lb 0.4 oz), last menstrual period 08/02/2010, SpO2 97.00%.   Labs: No results found for this basename: WBC:2,HGB:2,HCT:2,MCV:2,PLT:2 in the last 72 hours No results found for this basename:  VITAMINB12,FOLATE,FERRITIN,TIBC,IRON,RETICCTPCT in the last 72 hours No results found for this basename: DDIMER:2 in the last 72 hours  Lab 06/21/11 1402  NA 142  K 3.6  CL 106  CO2 28  BUN 13  CREATININE 1.0  CALCIUM 9.0  PROT --  BILITOT --  ALKPHOS --  ALT --  AST --  AMYLASE --  LIPASE --  GLUCOSE 102*   No results found for this basename: HGBA1C in the last 72 hours No results found for this basename: CKTOTAL:3,CKMB:3,CKMBINDEX:3,TROPONINI:3 in the last 72 hours No components found with this basename: POCBNP No results found for this basename: CHOL,HDL,LDLCALC,TRIG,CHOLHDL,LDLDIRECT in the last 72 hours No results found for this basename: TSH,T4TOTAL,FREET3,T3FREE,THYROIDAB in the last 72 hours  Disposition: stable, in good condition  Discharge Orders    Future Appointments: Provider: Department: Dept Phone: Center:   07/12/2011 11:15 AM [name], MD [facility] [phone] [facility]     Follow-up Information    Follow up with [name], MD. Call on 07/16/2011. (At 11:15 Am. )    Contact information:   [address] [place] [address] [phone]          Discharge Medications:  Current Discharge Medication List    START taking these medications   Details  nitroGLYCERIN (NITROSTAT) 0.4 MG SL tablet Place 1 tablet (0.4 mg total) under the tongue every 5 (five) minutes as needed for chest pain. Qty: 25 tablet, Refills: 3    rosuvastatin (CRESTOR) 20 MG tablet Take 1 tablet (20 mg total) by mouth at bedtime. Qty: 30 tablet, Refills: 3      CONTINUE these medications which have NOT CHANGED   Details  aspirin 325 MG EC tablet  Take 325 mg by mouth daily.      BLACK COHOSH PO Take 1 tablet by mouth daily.     Ginger, Zingiber officinalis, (GINGER PO) Take by mouth.      glucosamine-chondroitin 500-400 MG tablet Take 1 tablet by mouth 3 (three) times daily.        HYDROcodone-acetaminophen (VICODIN) 5-500 MG per tablet Take 1 tablet by mouth every 4 (four) hours as needed. For pain.    isosorbide mononitrate (IMDUR) 30 MG 24 hr tablet Take 30 mg by mouth daily.      lansoprazole (PREVACID) 30 MG capsule Take 30 mg by mouth daily. AM    methocarbamol (ROBAXIN) 500 MG tablet Take 500 mg by mouth 3 (three) times daily as needed. For pain / muscle spasms.    metoprolol (TOPROL-XL) 50 MG 24 hr tablet Take 50 mg by mouth daily.      Misc Natural Products (TART CHERRY ADVANCED PO) Take 1 tablet by mouth daily.         Outstanding Labs/Studies None today   Duration of Discharge Encounter: Greater than 30 minutes including physician time.  Signed, R. Hurman Horn, PA-C 06/28/2011, 8:40 AM

## 2011-06-28 NOTE — Progress Notes (Signed)
SUBJECTIVE: Pt admitted after diagnostic cath in JV lab and FFR in main lab. She had a small hematoma. No problems overnight. No chest pain or SOB.  BP 100/66  Pulse 71  Temp(Src) 98.3 F (36.8 C) (Oral)  Resp 18  Ht 5\' 1"  (1.549 m)  Wt 150 lb 0.4 oz (68.05 kg)  BMI 28.35 kg/m2  SpO2 97%  LMP 08/02/2010 No intake or output data in the 24 hours ending 06/28/11 0817  PHYSICAL EXAM General: Well developed, well nourished, in no acute distress. Alert and oriented x 3.  Psych:  Good affect, responds appropriately Neck: No JVD. No masses noted.  Lungs: Clear bilaterally with no wheezes or rhonci noted.  Heart: RRR with no murmurs noted. Abdomen: Bowel sounds are present. Soft, non-tender.  Extremities: No lower extremity edema. Soft right groin, no hematoma.     Current Meds:    . aspirin  81 mg Oral Daily  . fentaNYL      . heparin      . heparin      . isosorbide mononitrate  30 mg Oral Daily  . metoprolol  50 mg Oral Daily  . midazolam      . midazolam      . nitroGLYCERIN      . pantoprazole  40 mg Oral Q1200  . rosuvastatin  20 mg Oral QHS  . DISCONTD: adenosine  12 mL Intravenous Once     ASSESSMENT AND PLAN:  1. CAD: s/p cath yesterday per Dr. Antoine Poche. Chronically occluded RCA with collaterals and moderate mid LAD stenosis that was not significant by FFR. Plan for medical management. Crestor 20 mg po QHS was added. Admitted overnight secondary to small hematoma at cath site. Groin in mildly sore this am. Pt ambulating without difficulty. No evidence of hematoma. Will d/c home. Follow up with Dr. Antoine Poche in 2-3 weeks.     Margaret Howard  12/28/20128:17 AM

## 2011-06-28 NOTE — Progress Notes (Signed)
Pt had a groin bleed after cath.  Will keep overnight and DC in the AM.  Alvia Grove., MD, [facility] 06/28/2011, 10:30 AM

## 2011-06-28 NOTE — Discharge Summary (Signed)
Full note written this am. cdm 

## 2011-06-28 NOTE — Progress Notes (Signed)
Cardiac Rehab Phase I  1200 - 1235 Pt education done with pt and family with understanding.

## 2011-07-03 ENCOUNTER — Telehealth: Payer: Self-pay | Admitting: Cardiology

## 2011-07-03 NOTE — Telephone Encounter (Signed)
Per pt call  -states that her cath site is still very uncomfortable.  States site is black and blue which she expected but she has a lump along the bend of her leg at her panty line.  appt scheduled for tomorrow for groin check at 11:30

## 2011-07-03 NOTE — Telephone Encounter (Signed)
New Msg: Pt calling c/o lump in groin area following cath on 12/27. Pt said area has been uncomfortable since she got home and felt the lump this morning. Pt doesn't know if she needs to be seen and/or wants to be advised as to what to do. Please return pt call to discuss further.

## 2011-07-04 ENCOUNTER — Telehealth: Payer: Self-pay | Admitting: Cardiology

## 2011-07-04 ENCOUNTER — Encounter: Payer: Self-pay | Admitting: Physician Assistant

## 2011-07-04 ENCOUNTER — Encounter (INDEPENDENT_AMBULATORY_CARE_PROVIDER_SITE_OTHER): Payer: Managed Care, Other (non HMO) | Admitting: *Deleted

## 2011-07-04 ENCOUNTER — Ambulatory Visit (INDEPENDENT_AMBULATORY_CARE_PROVIDER_SITE_OTHER): Payer: Managed Care, Other (non HMO) | Admitting: Physician Assistant

## 2011-07-04 VITALS — BP 120/86 | HR 69 | Resp 18 | Ht 61.0 in | Wt 153.1 lb

## 2011-07-04 DIAGNOSIS — R1030 Lower abdominal pain, unspecified: Secondary | ICD-10-CM

## 2011-07-04 DIAGNOSIS — S301XXA Contusion of abdominal wall, initial encounter: Secondary | ICD-10-CM

## 2011-07-04 DIAGNOSIS — IMO0002 Reserved for concepts with insufficient information to code with codable children: Secondary | ICD-10-CM | POA: Insufficient documentation

## 2011-07-04 DIAGNOSIS — I2510 Atherosclerotic heart disease of native coronary artery without angina pectoris: Secondary | ICD-10-CM

## 2011-07-04 DIAGNOSIS — M79609 Pain in unspecified limb: Secondary | ICD-10-CM

## 2011-07-04 DIAGNOSIS — R109 Unspecified abdominal pain: Secondary | ICD-10-CM

## 2011-07-04 LAB — CBC WITH DIFFERENTIAL/PLATELET
Basophils Absolute: 0 10*3/uL (ref 0.0–0.1)
Hemoglobin: 13.3 g/dL (ref 12.0–15.0)
Lymphocytes Relative: 22.7 % (ref 12.0–46.0)
Monocytes Relative: 6 % (ref 3.0–12.0)
Neutro Abs: 5.8 10*3/uL (ref 1.4–7.7)
Platelets: 287 10*3/uL (ref 150.0–400.0)
RDW: 12.2 % (ref 11.5–14.6)
WBC: 8.3 10*3/uL (ref 4.5–10.5)

## 2011-07-04 MED ORDER — HYDROCODONE-ACETAMINOPHEN 5-500 MG PO TABS: ORAL_TABLET | ORAL | Status: DC

## 2011-07-04 NOTE — Progress Notes (Signed)
[address] [place], [place]  [address] Phone: [phone] Fax:  [phone]  Date:  07/04/2011   Name:  Margaret Howard       DOB:  1966-06-09 MRN:  [id number]  PCP:  Dr. Lebron Conners Primary Cardiologist:  Dr. [name]  Primary Electrophysiologist:  None    History of Present Illness: Margaret Howard is a 46 y.o. female who presents for a groin check post cath.  She was recently seen by Dr. Antoine Poche.  She had some complaints of chest pain.  She had coronary calcifications on chest CT.  She had submaximal exercise on ETT-echocardiogram.  Outpatient heart catheterization was arranged 06/27/11: mLAD 50-60%, ostial septal perf 50%, mRCA occluded with L-R collats, EF 55% with inf HK.  She underwent FFR of he LAD (0.87 after adenosine) and this was not hemodynamically significant.  Medical Rx was recommended.  She had a post cath hematoma and was observed overnight.  Right femoral arteriotomy site stable at discharge.  She called in with more pain at her catheter site and increased swelling.  She was added on my schedule today for further evaluation.  She notes worsening pain and swelling.  Notes bruising as well.  Pain noted with sitting and standing.  No back pain.  No syncope or near syncope.  CP better with Imdur, but she now has a headache.  No dyspnea.  No orthopnea, PND or edema.    Past Medical History  Diagnosis Date  . Positive ANA (antinuclear antibody)   . Arthritis     spine, feet, hands, knees  . GERD (gastroesophageal reflux disease)   . Hypertension     has been off med. > 6 mos.  . Fibromyalgia   . Lipoma of thigh     left  . Shortness of breath     with exertion    Current Outpatient Prescriptions  Medication Sig Dispense Refill  . aspirin 325 MG EC tablet Take 325 mg by mouth daily.        Marland Kitchen BLACK COHOSH PO Take 1 tablet by mouth daily.       . Ginger, Zingiber officinalis, (GINGER PO) Take by mouth.        Marland Kitchen glucosamine-chondroitin 500-400  MG tablet Take 1 tablet by mouth 3 (three) times daily.        Marland Kitchen HYDROcodone-acetaminophen (VICODIN) 5-500 MG per tablet Take 1 tablet by mouth every 4 (four) hours as needed. For pain.      . isosorbide mononitrate (IMDUR) 30 MG 24 hr tablet Take 30 mg by mouth daily.        . lansoprazole (PREVACID) 30 MG capsule Take 30 mg by mouth daily. AM      . methocarbamol (ROBAXIN) 500 MG tablet Take 500 mg by mouth 3 (three) times daily as needed. For pain / muscle spasms.      . metoprolol (TOPROL-XL) 50 MG 24 hr tablet Take 50 mg by mouth daily.        . Misc Natural Products (TART CHERRY ADVANCED PO) Take 1 tablet by mouth daily.       . nitroGLYCERIN (NITROSTAT) 0.4 MG SL tablet Place 1 tablet (0.4 mg total) under the tongue every 5 (five) minutes as needed for chest pain.  25 tablet  3  . rosuvastatin (CRESTOR) 20 MG tablet Take 1 tablet (20 mg total) by mouth at bedtime.  30 tablet  3    Allergies: No Known Allergies  History  Substance Use Topics  . Smoking status: Former Smoker -- 1.0 packs/day    Quit date: 03/07/1986  . Smokeless tobacco: Never Used  . Alcohol Use: Yes     socially     PHYSICAL EXAM: VS:  BP 120/86  Pulse 69  Resp 18  Ht 5\' 1"  (1.549 m)  Wt 153 lb 1.9 oz (69.455 kg)  BMI 28.93 kg/m2  LMP 08/02/2010 Well nourished, well developed, in no acute distress HEENT: normal Neck: no JVD Cardiac:  normal S1, S2; RRR; no murmur Lungs:  clear to auscultation bilaterally, no wheezing, rhonchi or rales Abd: soft, nontender, no hepatomegaly Ext: no edema; right FA site with small to mod sized hematoma, + pain with minimal palpation, no bruit; mod ecchymosis noted Vascular: DP/PT 2+ on the right Skin: warm and dry Neuro:  CNs 2-12 intact, no focal abnormalities noted  ASSESSMENT AND PLAN:

## 2011-07-04 NOTE — Patient Instructions (Signed)
Your physician recommends that you have lab work today: CBC  Your physician has requested that you have a lower extremity arterial duplex. This test is an ultrasound of the arteries in the legs. It looks at arterial blood flow in the legs. Allow one hour for Lower Arterial scans. There are no restrictions or special instructions  Your physician recommends that you keep your scheduled follow-up appointment.

## 2011-07-04 NOTE — Telephone Encounter (Signed)
All Cardiac faxed to Dr.Harrison (per Pt Signed ROI) @ [phone] 07/04/11/km

## 2011-07-04 NOTE — Assessment & Plan Note (Signed)
I do not think she has a pseudoaneurysm.  But, with the degree of pain she is having, I will check an U/S.  Also, obtain a CBC today.  Will give Vicodin 5/500 to use prn severe pain.  Keep follow up as planned with Dr. [name] or return sooner PRN.

## 2011-07-04 NOTE — Assessment & Plan Note (Signed)
Chest pain improved with Imdur.  She is having a headache but thinks she can tolerate.  Keep follow up with Dr. [name].

## 2011-07-05 ENCOUNTER — Other Ambulatory Visit: Payer: Self-pay | Admitting: Cardiology

## 2011-07-09 ENCOUNTER — Telehealth: Payer: Self-pay | Admitting: Cardiology

## 2011-07-09 NOTE — Telephone Encounter (Signed)
Patient aware that cardiac papers were faxed to Dr. Charyl Dancer verbalized understanding.

## 2011-07-09 NOTE — Telephone Encounter (Signed)
Fu call °Patient returning your call °

## 2011-07-12 ENCOUNTER — Encounter: Payer: Managed Care, Other (non HMO) | Admitting: Cardiology

## 2011-07-18 ENCOUNTER — Encounter: Payer: Managed Care, Other (non HMO) | Admitting: Cardiology

## 2011-07-23 ENCOUNTER — Ambulatory Visit (INDEPENDENT_AMBULATORY_CARE_PROVIDER_SITE_OTHER): Payer: Managed Care, Other (non HMO) | Admitting: Cardiology

## 2011-07-23 ENCOUNTER — Encounter: Payer: Self-pay | Admitting: Cardiology

## 2011-07-23 VITALS — BP 99/90 | HR 75 | Ht 61.0 in | Wt 148.1 lb

## 2011-07-23 DIAGNOSIS — I10 Essential (primary) hypertension: Secondary | ICD-10-CM

## 2011-07-23 DIAGNOSIS — I2510 Atherosclerotic heart disease of native coronary artery without angina pectoris: Secondary | ICD-10-CM

## 2011-07-23 DIAGNOSIS — E785 Hyperlipidemia, unspecified: Secondary | ICD-10-CM | POA: Insufficient documentation

## 2011-07-23 MED ORDER — ISOSORBIDE MONONITRATE ER 60 MG PO TB24: 60.0000 mg | ORAL_TABLET | Freq: Every day | ORAL | Status: DC

## 2011-07-23 NOTE — Assessment & Plan Note (Signed)
The patient has coronary disease as described. We are going to be very aggressive in risk reduction. I'm going to increase her Imdur to 60 mg daily.

## 2011-07-23 NOTE — Assessment & Plan Note (Signed)
Her LDL was only 105 recently. She was started on Crestor. My goal will be less than 70 and I will likely follow lipoma it profiles. She will get a repeat lipid profile in one month.

## 2011-07-23 NOTE — Patient Instructions (Addendum)
Please have fasting blood work in 1 month (Lipid/liver)  Please increase Imdur to 60 mg a day.  Continue all other medications as listed.  Follow up in 4 months with Dr Antoine Poche.  You will receive a letter in the mail 2 months before you are due.  Please call us when you receive this letter to schedule your follow up appointment.

## 2011-07-23 NOTE — Progress Notes (Signed)
HPI The patient presents for evaluation of an abnormal chest CT demonstrating LAD calcification.  I sent her for catheterization which demonstrated non obstructive disease in the LAD but an occluded RCA.  She did have coronary stenosis as described.  She had an FFR which was negative for hemodynamic significance.  She returned for follow and she did have some right groin pain.  She was not found to have pseudoaneurysm or AV fistula on followup ultrasound.  Since that last visit she has tried to eat better. She's lost some weight. She's doing some walking. She still getting some chest discomfort with walking. She'll stop and it will go away. She has not taken any nitroglycerin. She denies any new symptoms of shortness of breath, palpitations, presyncope or syncope. She's had no PND or orthopnea. She has had no weight gain or edema.  No Known Allergies  Current Outpatient Prescriptions  Medication Sig Dispense Refill  . aspirin 325 MG EC tablet Take 325 mg by mouth daily.        Marland Kitchen BLACK COHOSH PO Take 1 tablet by mouth daily.       . CRESTOR 20 MG tablet TAKE 1 TABELT BY MOUTH AT BEDTIME  90 tablet  3  . fish oil-omega-3 fatty acids 1000 MG capsule Take 2 g by mouth daily. bid      . Ginger, Zingiber officinalis, (GINGER PO) Take by mouth.        Marland Kitchen glucosamine-chondroitin 500-400 MG tablet Take 1 tablet by mouth 3 (three) times daily.        . isosorbide mononitrate (IMDUR) 30 MG 24 hr tablet TAKE 1 TABLET BY MOUTH EVERY DAY  90 tablet  3  . lansoprazole (PREVACID) 30 MG capsule Take 30 mg by mouth daily. AM      . metoprolol (TOPROL-XL) 50 MG 24 hr tablet Take 50 mg by mouth daily.        . Misc Natural Products (TART CHERRY ADVANCED PO) Take 1 tablet by mouth daily.       . nitroGLYCERIN (NITROSTAT) 0.4 MG SL tablet Place 1 tablet (0.4 mg total) under the tongue every 5 (five) minutes as needed for chest pain.  25 tablet  3  . Turmeric 450 MG CAPS Take 450 mg by mouth daily.        Past  Medical History  Diagnosis Date  . Positive ANA (antinuclear antibody)   . Arthritis     spine, feet, hands, knees  . GERD (gastroesophageal reflux disease)   . Hypertension     has been off med. > 6 mos.  . Fibromyalgia   . Lipoma of thigh     left  . Shortness of breath     with exertion  . CAD (coronary artery disease)     LHC 06/27/11: mLAD 50-60%, ostial septal perf 50%, mRCA occluded with L-R collats, EF 55% with inf HK.  She underwent FFR of he LAD (0.87 after adenosine) and this was not hemodynamically significant   ROS:  As stated in the HPI and negative for all other systems.  PHYSICAL EXAM BP 99/90  Pulse 75  Ht 5\' 1"  (1.549 m)  Wt 148 lb 1.9 oz (67.187 kg)  BMI 27.99 kg/m2  LMP 08/02/2010 GENERAL:  Well appearing HEENT:  Pupils equal round and reactive, fundi not visualized, oral mucosa unremarkable NECK:  No jugular venous distention, waveform within normal limits, carotid upstroke brisk and symmetric, no bruits, no thyromegaly LYMPHATICS:  No cervical, inguinal  adenopathy LUNGS:  Clear to auscultation bilaterally BACK:  No CVA tenderness CHEST:  Unremarkable HEART:  PMI not displaced or sustained,S1 and S2 within normal limits, no S3, no S4, no clicks, no rubs, no murmurs ABD:  Flat, positive bowel sounds normal in frequency in pitch, no bruits, no rebound, no guarding, no midline pulsatile mass, no hepatomegaly, no splenomegaly EXT:  2 plus pulses throughout, no edema, no cyanosis no clubbing SKIN:  No rashes no nodules NEURO:  Cranial nerves II through XII grossly intact, motor grossly intact throughout PSYCH:  Cognitively intact, oriented to person place and time  ASSESSMENT AND PLAN

## 2011-07-23 NOTE — Assessment & Plan Note (Signed)
Her blood pressure is running low. She will let me know if she has any lightheadedness with this change. Currently she is tolerating this.

## 2011-07-25 ENCOUNTER — Encounter: Payer: Self-pay | Admitting: Internal Medicine

## 2011-08-23 ENCOUNTER — Other Ambulatory Visit: Payer: Managed Care, Other (non HMO)

## 2011-08-26 HISTORY — PX: CAROTID STENT: SHX1301

## 2011-09-17 ENCOUNTER — Ambulatory Visit (INDEPENDENT_AMBULATORY_CARE_PROVIDER_SITE_OTHER): Payer: Managed Care, Other (non HMO) | Admitting: Internal Medicine

## 2011-09-17 ENCOUNTER — Encounter: Payer: Self-pay | Admitting: Internal Medicine

## 2011-09-17 VITALS — BP 120/80 | HR 66 | Wt 149.0 lb

## 2011-09-17 DIAGNOSIS — E785 Hyperlipidemia, unspecified: Secondary | ICD-10-CM

## 2011-09-17 DIAGNOSIS — I2510 Atherosclerotic heart disease of native coronary artery without angina pectoris: Secondary | ICD-10-CM

## 2011-09-17 DIAGNOSIS — R768 Other specified abnormal immunological findings in serum: Secondary | ICD-10-CM

## 2011-09-17 DIAGNOSIS — R894 Abnormal immunological findings in specimens from other organs, systems and tissues: Secondary | ICD-10-CM

## 2011-09-17 DIAGNOSIS — R911 Solitary pulmonary nodule: Secondary | ICD-10-CM

## 2011-09-17 DIAGNOSIS — I10 Essential (primary) hypertension: Secondary | ICD-10-CM

## 2011-09-17 NOTE — Progress Notes (Signed)
  Subjective:    Patient ID: Margaret Howard, female    DOB: 12/26/1965, 46 y.o.   MRN: [id number]  HPI Patient comes in today for follow up of  multiple medical problems.  Since her last visit she has been diagnosed with coronary artery disease that caused  Clinical angina. Dyspnea on exertion and fatigue. She had a catheterization which showed complete occulusion of RCA but did have   collateral vessels.    Was seen at [facility]  Per Dr Margaret Howard  and underwent a  Successful DES about 3 weeks ago she is now to do cardiac rehabilitation. Her clinical symptoms of angina no R. resolved and she is able to exercise without limitation related to the cardiovascular system No need for ntg now. To have it as needed   As needed.   Dr. Bernette Howard at [facility] has requested [place] to get a fasting lipid profile and CMP and fax results to him. She is currently on Plavix and Crestor and metoprolol..  She has not had her lipoma of the thigh removed but is going to wait on this  She sees Dr. Durenda Howard rheumatologist at the end of the month. She has been getting shots in her hips for bursitis and doing better with her physical mobility Review of Systems Negative for fever weight loss chest pain shortness of breath she does get reflex which doesn't take her medication minimal bruising bleeding  to scan since on Plavix. She is to see her gynecologist today. They're following an area in the right breast. She had an incidental pulmonary nodule were noted on her chest CT done in November of last year.  Past history family history social history reviewed in the electronic medical record.     Objective:   Physical Exam Well-developed well-nourished in no acute distress looks well Neck no masses or bruit Chest:  Clear to A&P without wheezes rales or rhonchi CV:  S1-S2 no gallops or murmurs peripheral perfusion is normal Abdomen:  Sof,t normal bowel sounds without hepatosplenomegaly, no guarding rebound or masses no CVA  tenderness  Reviewed data cath and consult notes      Assessment & Plan:  CAD  With symptomatic angina  S/P DES with resolution of sx.    Round about way to get dx.   Although Had collaterals.   No  To minimal LV dysfunction.   By hx    asymptomatic after stent  Placement. States she feels much better. Caution with supplements and meds . Check for interactions HT stable  Hx of incidental pulm nodule  Due for scan in November.   Remote hx of tobacco MS  "fibromyalgia" with pos ANA  uncertain significance  consider whether or some rheumatologic aggravator that would set Margaret Howard up for early coronary symptomatic disease.   GERD  On medication  For suppression of sx.

## 2011-09-17 NOTE — Patient Instructions (Addendum)
Get fasting labs and will send result to Dr Romeo Apple.  Will notify you  of labs when available.  You are due for repeat chest st to check nodule November 30 or thereabouts.

## 2011-09-19 DIAGNOSIS — E785 Hyperlipidemia, unspecified: Secondary | ICD-10-CM | POA: Insufficient documentation

## 2011-09-20 ENCOUNTER — Other Ambulatory Visit (INDEPENDENT_AMBULATORY_CARE_PROVIDER_SITE_OTHER): Payer: Managed Care, Other (non HMO)

## 2011-09-20 DIAGNOSIS — I10 Essential (primary) hypertension: Secondary | ICD-10-CM

## 2011-09-20 DIAGNOSIS — I2510 Atherosclerotic heart disease of native coronary artery without angina pectoris: Secondary | ICD-10-CM

## 2011-09-20 DIAGNOSIS — E785 Hyperlipidemia, unspecified: Secondary | ICD-10-CM

## 2011-09-20 LAB — LIPID PANEL
Cholesterol: 111 mg/dL (ref 0–200)
HDL: 63.5 mg/dL (ref >39.00)
LDL Cholesterol: 38 mg/dL (ref 0–99)
Total CHOL/HDL Ratio: 2
Triglycerides: 49 mg/dL (ref 0.0–149.0)
VLDL: 9.8 mg/dL (ref 0.0–40.0)

## 2011-09-20 LAB — COMPREHENSIVE METABOLIC PANEL
ALT: 19 U/L (ref 0–35)
AST: 21 U/L (ref 0–37)
Albumin: 4.3 g/dL (ref 3.5–5.2)
Alkaline Phosphatase: 93 U/L (ref 39–117)
BUN: 13 mg/dL (ref 6–23)
CO2: 28 mEq/L (ref 19–32)
Calcium: 9.8 mg/dL (ref 8.4–10.5)
Chloride: 103 mEq/L (ref 96–112)
Creatinine, Ser: 0.7 mg/dL (ref 0.4–1.2)
GFR: 90.48 mL/min (ref >60.00)
Glucose, Bld: 91 mg/dL (ref 70–99)
Potassium: 4.9 mEq/L (ref 3.5–5.1)
Sodium: 141 mEq/L (ref 135–145)
Total Bilirubin: 0.6 mg/dL (ref 0.3–1.2)
Total Protein: 7.3 g/dL (ref 6.0–8.3)

## 2011-09-20 LAB — TSH: TSH: 1.71 u[IU]/mL (ref 0.35–5.50)

## 2011-09-24 ENCOUNTER — Telehealth: Payer: Self-pay | Admitting: Internal Medicine

## 2011-09-24 NOTE — Telephone Encounter (Signed)
Suandera, I'm passing this along to you. Thanks!

## 2011-09-24 NOTE — Telephone Encounter (Signed)
Patient called and is having difficulty with billing.  She states her husband is being listed as the patient for her office visits.. Please contact patient as soon as possible

## 2011-09-25 ENCOUNTER — Encounter: Payer: Self-pay | Admitting: *Deleted

## 2011-09-25 NOTE — Progress Notes (Signed)
Quick Note:    Letter sent to pt.  ______

## 2011-10-04 ENCOUNTER — Other Ambulatory Visit: Payer: Self-pay | Admitting: Obstetrics & Gynecology

## 2011-10-04 DIAGNOSIS — Z1231 Encounter for screening mammogram for malignant neoplasm of breast: Secondary | ICD-10-CM

## 2011-10-07 LAB — HM PAP SMEAR: HM Pap smear: NEGATIVE

## 2011-10-15 ENCOUNTER — Ambulatory Visit (INDEPENDENT_AMBULATORY_CARE_PROVIDER_SITE_OTHER): Payer: Managed Care, Other (non HMO) | Admitting: Internal Medicine

## 2011-10-15 ENCOUNTER — Encounter: Payer: Self-pay | Admitting: Internal Medicine

## 2011-10-15 VITALS — BP 104/74 | HR 72 | Temp 97.6°F | Wt 150.0 lb

## 2011-10-15 DIAGNOSIS — W19XXXA Unspecified fall, initial encounter: Secondary | ICD-10-CM

## 2011-10-15 DIAGNOSIS — S139XXA Sprain of joints and ligaments of unspecified parts of neck, initial encounter: Secondary | ICD-10-CM

## 2011-10-15 DIAGNOSIS — I2510 Atherosclerotic heart disease of native coronary artery without angina pectoris: Secondary | ICD-10-CM

## 2011-10-15 DIAGNOSIS — T148XXA Other injury of unspecified body region, initial encounter: Secondary | ICD-10-CM

## 2011-10-15 DIAGNOSIS — S161XXA Strain of muscle, fascia and tendon at neck level, initial encounter: Secondary | ICD-10-CM

## 2011-10-15 MED ORDER — CYCLOBENZAPRINE HCL 10 MG PO TABS: 10.0000 mg | ORAL_TABLET | Freq: Three times a day (TID) | ORAL | Status: AC | PRN
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
## 2011-10-15 NOTE — Patient Instructions (Addendum)
This seems like a neck strain  And for now  treat as such.  Tylenol ice  Mild muscle relaxant .  Can  Use  your neck brace here and there if needed. If not improving in another  Week or so . If  persistent or progressive consider seeing Dr Shon Baton.  Look at [email or web address]

## 2011-10-20 DIAGNOSIS — S161XXA Strain of muscle, fascia and tendon at neck level, initial encounter: Secondary | ICD-10-CM | POA: Insufficient documentation

## 2011-10-20 DIAGNOSIS — T148XXA Other injury of unspecified body region, initial encounter: Secondary | ICD-10-CM | POA: Insufficient documentation

## 2011-10-20 DIAGNOSIS — W19XXXA Unspecified fall, initial encounter: Secondary | ICD-10-CM

## 2011-10-20 HISTORY — DX: Unspecified fall, initial encounter: W19.XXXA

## 2011-10-29 ENCOUNTER — Ambulatory Visit
Admission: RE | Admit: 2011-10-29 | Discharge: 2011-10-29 | Disposition: A | Discharge status: Home / Self Care | Payer: Managed Care, Other (non HMO) | Source: Ambulatory Visit | Attending: Obstetrics & Gynecology | Admitting: Obstetrics & Gynecology

## 2011-10-29 DIAGNOSIS — Z1231 Encounter for screening mammogram for malignant neoplasm of breast: Secondary | ICD-10-CM

## 2011-12-12 ENCOUNTER — Encounter (HOSPITAL_COMMUNITY)
Admission: RE | Admit: 2011-12-12 | Discharge: 2011-12-12 | Disposition: A | Discharge status: Home / Self Care | Payer: Managed Care, Other (non HMO) | Source: Ambulatory Visit | Attending: Cardiology | Admitting: Cardiology

## 2011-12-12 DIAGNOSIS — Z95818 Presence of other cardiac implants and grafts: Secondary | ICD-10-CM | POA: Insufficient documentation

## 2011-12-12 DIAGNOSIS — Z5189 Encounter for other specified aftercare: Secondary | ICD-10-CM | POA: Insufficient documentation

## 2011-12-12 NOTE — Progress Notes (Signed)
Cardiac Rehab Medication Review by a Pharmacist  Does the patient  feel that his/her medications are working for him/her?  yes  Has the patient been experiencing any side effects to the medications prescribed?  no  Does the patient measure his/her own blood pressure or blood glucose at home?  no   Does the patient have any problems obtaining medications due to transportation or finances?   no  Understanding of regimen: excellent Understanding of indications: excellent Potential of compliance: excellent    Pharmacist comments: Patient doing well with no side effects except for some bruising from the plavix    Janace Litten, PharmD 12/12/2011 8:12 AM

## 2011-12-16 ENCOUNTER — Encounter (HOSPITAL_COMMUNITY)
Admission: RE | Admit: 2011-12-16 | Discharge: 2011-12-16 | Disposition: A | Discharge status: Home / Self Care | Payer: Managed Care, Other (non HMO) | Source: Ambulatory Visit | Attending: Cardiology | Admitting: Cardiology

## 2011-12-16 NOTE — Progress Notes (Signed)
Pt started cardiac rehab today.  Pt tolerated light exercise without difficulty. Monitor showed SR with slight st depression and no additional ectopy.  Continue to monitor.

## 2011-12-18 ENCOUNTER — Encounter (HOSPITAL_COMMUNITY)
Admission: RE | Admit: 2011-12-18 | Discharge: 2011-12-18 | Disposition: A | Discharge status: Home / Self Care | Payer: Managed Care, Other (non HMO) | Source: Ambulatory Visit | Attending: Cardiology | Admitting: Cardiology

## 2011-12-20 ENCOUNTER — Encounter (HOSPITAL_COMMUNITY)
Admission: RE | Admit: 2011-12-20 | Discharge: 2011-12-20 | Disposition: A | Discharge status: Home / Self Care | Payer: Managed Care, Other (non HMO) | Source: Ambulatory Visit | Attending: Cardiology | Admitting: Cardiology

## 2011-12-20 NOTE — Progress Notes (Signed)
Pt will be absent the week of 6/24 on vacation.

## 2011-12-23 ENCOUNTER — Encounter (HOSPITAL_COMMUNITY): Payer: Managed Care, Other (non HMO)

## 2011-12-25 ENCOUNTER — Encounter (HOSPITAL_COMMUNITY): Payer: Managed Care, Other (non HMO)

## 2011-12-27 ENCOUNTER — Encounter (HOSPITAL_COMMUNITY): Payer: Managed Care, Other (non HMO)

## 2011-12-30 ENCOUNTER — Encounter (HOSPITAL_COMMUNITY)
Admission: RE | Admit: 2011-12-30 | Discharge: 2011-12-30 | Disposition: A | Discharge status: Home / Self Care | Payer: Managed Care, Other (non HMO) | Source: Ambulatory Visit | Attending: Cardiology | Admitting: Cardiology

## 2011-12-30 DIAGNOSIS — Z95818 Presence of other cardiac implants and grafts: Secondary | ICD-10-CM | POA: Insufficient documentation

## 2011-12-30 DIAGNOSIS — Z5189 Encounter for other specified aftercare: Secondary | ICD-10-CM | POA: Insufficient documentation

## 2011-12-30 DIAGNOSIS — I2510 Atherosclerotic heart disease of native coronary artery without angina pectoris: Secondary | ICD-10-CM | POA: Insufficient documentation

## 2012-01-01 ENCOUNTER — Encounter (HOSPITAL_COMMUNITY)
Admission: RE | Admit: 2012-01-01 | Discharge: 2012-01-01 | Disposition: A | Discharge status: Home / Self Care | Payer: Managed Care, Other (non HMO) | Source: Ambulatory Visit | Attending: Cardiology | Admitting: Cardiology

## 2012-01-03 ENCOUNTER — Encounter (HOSPITAL_COMMUNITY)
Admission: RE | Admit: 2012-01-03 | Discharge: 2012-01-03 | Disposition: A | Discharge status: Home / Self Care | Payer: Managed Care, Other (non HMO) | Source: Ambulatory Visit | Attending: Cardiology | Admitting: Cardiology

## 2012-01-06 ENCOUNTER — Encounter (HOSPITAL_COMMUNITY)
Admission: RE | Admit: 2012-01-06 | Discharge: 2012-01-06 | Disposition: A | Discharge status: Home / Self Care | Payer: Managed Care, Other (non HMO) | Source: Ambulatory Visit | Attending: Cardiology | Admitting: Cardiology

## 2012-01-06 NOTE — Progress Notes (Signed)
Reviewed home exercise with pt today.  Pt plans to walk outdoors and on home treadmill for exercise.  Reviewed THR, pulse, RPE, sign and symptoms, NTG use, and when to call 911 or MD.  Pt voiced understanding. Electronically signed by Harriett Sine MS on Monday January 06 2012 at [phone]

## 2012-01-08 ENCOUNTER — Encounter (HOSPITAL_COMMUNITY)
Admission: RE | Admit: 2012-01-08 | Discharge: 2012-01-08 | Disposition: A | Discharge status: Home / Self Care | Payer: Managed Care, Other (non HMO) | Source: Ambulatory Visit | Attending: Cardiology | Admitting: Cardiology

## 2012-01-08 NOTE — Progress Notes (Signed)
Margaret Howard 46 y.o. female Nutrition Note Spoke with pt. Nutrition Plan, Nutrition Survey, and cholesterol goals reviewed with pt. Pt is following Step 2 of the Therapeutic Lifestyle Changes diet. Pt wants to lose wt. Per discussion, pt states her wt loss has been slow. Pt reportedly gained 1 lb over vacation. Per pt, her highest wt was 155 lb. Pt has maintained a 7-8 lb wt loss since her procedure. Pt feels her wt loss has been due to significant changes in her diet. Weight loss tips discussed.  Nutrition Diagnosis   Food-and nutrition-related knowledge deficit related to lack of exposure to information as related to diagnosis of: ? CVD    Overweight related to excessive energy intake as evidenced by a BMI of 27.0 Nutrition RX/ Estimated Daily Nutrition Needs for: wt loss  1200-1400 Kcal, 30-35 gm fat, 8-11 gm sat fat, 1.1-1.4 gm trans-fat, <1500 mg sodium  Nutrition Intervention   Pt's individual nutrition plan including cholesterol goals reviewed with pt.   Benefits of adopting Therapeutic Lifestyle Changes discussed when Medficts reviewed.   Pt to attend the Portion Distortion class   Pt to attend the  ? Nutrition I class                          ? Nutrition II class    Pt given handouts for: ? wt loss   Continue client-centered nutrition education by RD, as part of interdisciplinary care. Goal(s)   Pt to identify food quantities necessary to achieve: ? wt loss to a goal wt of 123-135 lb (56-61.4 kg) at graduation from cardiac rehab.  Monitor and Evaluate progress toward nutrition goal with team.

## 2012-01-10 ENCOUNTER — Encounter (HOSPITAL_COMMUNITY)
Admission: RE | Admit: 2012-01-10 | Discharge: 2012-01-10 | Disposition: A | Discharge status: Home / Self Care | Payer: Managed Care, Other (non HMO) | Source: Ambulatory Visit | Attending: Cardiology | Admitting: Cardiology

## 2012-01-13 ENCOUNTER — Encounter (HOSPITAL_COMMUNITY)
Admission: RE | Admit: 2012-01-13 | Discharge: 2012-01-13 | Disposition: A | Discharge status: Home / Self Care | Payer: Managed Care, Other (non HMO) | Source: Ambulatory Visit | Attending: Cardiology | Admitting: Cardiology

## 2012-01-15 ENCOUNTER — Encounter (HOSPITAL_COMMUNITY)
Admission: RE | Admit: 2012-01-15 | Discharge: 2012-01-15 | Disposition: A | Discharge status: Home / Self Care | Payer: Managed Care, Other (non HMO) | Source: Ambulatory Visit | Attending: Cardiology | Admitting: Cardiology

## 2012-01-17 ENCOUNTER — Encounter (HOSPITAL_COMMUNITY)
Admission: RE | Admit: 2012-01-17 | Discharge: 2012-01-17 | Disposition: A | Discharge status: Home / Self Care | Payer: Managed Care, Other (non HMO) | Source: Ambulatory Visit | Attending: Cardiology | Admitting: Cardiology

## 2012-01-20 ENCOUNTER — Encounter (HOSPITAL_COMMUNITY)
Admission: RE | Admit: 2012-01-20 | Discharge: 2012-01-20 | Disposition: A | Discharge status: Home / Self Care | Payer: Managed Care, Other (non HMO) | Source: Ambulatory Visit | Attending: Cardiology | Admitting: Cardiology

## 2012-01-22 ENCOUNTER — Encounter (HOSPITAL_COMMUNITY)
Admission: RE | Admit: 2012-01-22 | Discharge: 2012-01-22 | Disposition: A | Discharge status: Home / Self Care | Payer: Managed Care, Other (non HMO) | Source: Ambulatory Visit | Attending: Cardiology | Admitting: Cardiology

## 2012-01-24 ENCOUNTER — Encounter (HOSPITAL_COMMUNITY)
Admission: RE | Admit: 2012-01-24 | Discharge: 2012-01-24 | Disposition: A | Discharge status: Home / Self Care | Payer: Managed Care, Other (non HMO) | Source: Ambulatory Visit | Attending: Cardiology | Admitting: Cardiology

## 2012-01-27 ENCOUNTER — Encounter (HOSPITAL_COMMUNITY): Admission: RE | Admit: 2012-01-27 | Payer: Managed Care, Other (non HMO) | Source: Ambulatory Visit

## 2012-01-29 ENCOUNTER — Encounter (HOSPITAL_COMMUNITY): Payer: Managed Care, Other (non HMO)

## 2012-01-31 ENCOUNTER — Encounter (HOSPITAL_COMMUNITY): Payer: Managed Care, Other (non HMO)

## 2012-02-03 ENCOUNTER — Encounter (HOSPITAL_COMMUNITY)
Admission: RE | Admit: 2012-02-03 | Discharge: 2012-02-03 | Disposition: A | Discharge status: Home / Self Care | Payer: Managed Care, Other (non HMO) | Source: Ambulatory Visit | Attending: Cardiology | Admitting: Cardiology

## 2012-02-03 DIAGNOSIS — I2510 Atherosclerotic heart disease of native coronary artery without angina pectoris: Secondary | ICD-10-CM | POA: Insufficient documentation

## 2012-02-03 DIAGNOSIS — Z5189 Encounter for other specified aftercare: Secondary | ICD-10-CM | POA: Insufficient documentation

## 2012-02-03 DIAGNOSIS — Z95818 Presence of other cardiac implants and grafts: Secondary | ICD-10-CM | POA: Insufficient documentation

## 2012-02-05 ENCOUNTER — Encounter (HOSPITAL_COMMUNITY)
Admission: RE | Admit: 2012-02-05 | Discharge: 2012-02-05 | Disposition: A | Discharge status: Home / Self Care | Payer: Managed Care, Other (non HMO) | Source: Ambulatory Visit | Attending: Cardiology | Admitting: Cardiology

## 2012-02-07 ENCOUNTER — Encounter (HOSPITAL_COMMUNITY)
Admission: RE | Admit: 2012-02-07 | Discharge: 2012-02-07 | Disposition: A | Discharge status: Home / Self Care | Payer: Managed Care, Other (non HMO) | Source: Ambulatory Visit | Attending: Cardiology | Admitting: Cardiology

## 2012-02-10 ENCOUNTER — Encounter (HOSPITAL_COMMUNITY)
Admission: RE | Admit: 2012-02-10 | Discharge: 2012-02-10 | Disposition: A | Discharge status: Home / Self Care | Payer: Managed Care, Other (non HMO) | Source: Ambulatory Visit | Attending: Cardiology | Admitting: Cardiology

## 2012-02-10 NOTE — Progress Notes (Signed)
Pt with elevation in her blood pressure than her usual. Pt questioned about any changes in her routine.  Pt denies any new medications or missing her meds.  Pt reports that her grand-son who is 48 years old received a possible diagnosis of Asperger's.  Pt given support as well as community agencies that deal with new diagnosis of Autism.

## 2012-02-12 ENCOUNTER — Encounter (HOSPITAL_COMMUNITY)
Admission: RE | Admit: 2012-02-12 | Discharge: 2012-02-12 | Disposition: A | Discharge status: Home / Self Care | Payer: Managed Care, Other (non HMO) | Source: Ambulatory Visit | Attending: Cardiology | Admitting: Cardiology

## 2012-02-14 ENCOUNTER — Encounter (HOSPITAL_COMMUNITY)
Admission: RE | Admit: 2012-02-14 | Discharge: 2012-02-14 | Disposition: A | Discharge status: Home / Self Care | Payer: Managed Care, Other (non HMO) | Source: Ambulatory Visit | Attending: Cardiology | Admitting: Cardiology

## 2012-02-17 ENCOUNTER — Ambulatory Visit (HOSPITAL_COMMUNITY)
Admission: RE | Admit: 2012-02-17 | Discharge: 2012-02-17 | Disposition: A | Discharge status: Home / Self Care | Payer: Managed Care, Other (non HMO) | Source: Ambulatory Visit | Attending: Cardiology | Admitting: Cardiology

## 2012-02-17 ENCOUNTER — Encounter (HOSPITAL_COMMUNITY): Admission: RE | Admit: 2012-02-17 | Payer: Managed Care, Other (non HMO) | Source: Ambulatory Visit

## 2012-02-17 ENCOUNTER — Other Ambulatory Visit: Payer: Self-pay

## 2012-02-17 DIAGNOSIS — R0789 Other chest pain: Secondary | ICD-10-CM | POA: Insufficient documentation

## 2012-02-17 NOTE — Progress Notes (Addendum)
Pt presented to Cardiac Rehab anxious.  Pt reports that on last night she had a pressure type feeling.  Pt reported that she felt off and didn't feel right.  Pt felt throbbing sensation in her head and suddenly developed a headache.  Pt laid down and put her feet up. Pt did not take any NTG because she did not have the sharp stabbing discomfort as before.   Pt states that the pain was similar to her pain that originally brought her to the hospital, however it was not stabbing in nature or sharp.  Pt at present c/o chest pressure radiating to both arms.  Pt placed on monitor.  EKG shows SR with st depression which is similar to her previous readings.  Requested 12 lead EKG.  Bp  158/66.  Pt rates chest pressure and indigestion at a level 4 radiating to both arms.  Pt placed on O2 @4lncc .  Pt bp rechecked  138/88 sl  NTG given x 1 at 0703.  Pt reports that pressure is improved at a level 2 and arm discomfort is gone.  Pt given 2nd NTG at 0708.  Pt rates pressure after 5 minutes at level 1.  Pt requested to sit up because she was uncomfortable laying down with her   Pt reports that her pressure is completely gone level 0.  Dr. Romeo Apple. Pt's cardiologist at [facility] paged at 0730 by answering service.

## 2012-02-17 NOTE — Progress Notes (Addendum)
Note opened in error.  Kerry Odonohue Mulino Rn

## 2012-02-17 NOTE — Progress Notes (Signed)
Called to [facility] for continued f/u.   Message and information given to triage nurse for md at the clinic review.  Pt continues to be pain free and feels much better.  Information faxed with VS and 12 lead ekg for review.  Await return call.

## 2012-02-17 NOTE — Progress Notes (Signed)
Received return call.  Pt remains pain free for 45 minutes, O2 removed earlier.  Information on yesterday's events and this morning events.  Since pt is pain free the on call NPr felt it would be ok for pt to be discharge.  NP  plans to review information and recommend to pt a course of action.  Pt verbalized understanding.  BP 118/88.  Pt feels ok to drive herself home.  Pt will call rehab once she has arrived at home and will also notify staff of the next steps.

## 2012-02-19 ENCOUNTER — Encounter (HOSPITAL_COMMUNITY): Payer: Managed Care, Other (non HMO)

## 2012-02-21 ENCOUNTER — Encounter (HOSPITAL_COMMUNITY): Admission: RE | Admit: 2012-02-21 | Payer: Managed Care, Other (non HMO) | Source: Ambulatory Visit

## 2012-02-24 ENCOUNTER — Encounter (HOSPITAL_COMMUNITY): Payer: Managed Care, Other (non HMO)

## 2012-02-26 ENCOUNTER — Encounter (HOSPITAL_COMMUNITY): Payer: Managed Care, Other (non HMO)

## 2012-02-28 ENCOUNTER — Encounter (HOSPITAL_COMMUNITY): Payer: Managed Care, Other (non HMO)

## 2012-03-02 ENCOUNTER — Encounter (HOSPITAL_COMMUNITY): Payer: Managed Care, Other (non HMO)

## 2012-03-04 ENCOUNTER — Encounter (HOSPITAL_COMMUNITY): Payer: Managed Care, Other (non HMO)

## 2012-03-06 ENCOUNTER — Encounter (HOSPITAL_COMMUNITY): Payer: Managed Care, Other (non HMO)

## 2012-04-24 ENCOUNTER — Encounter: Payer: Self-pay | Admitting: Internal Medicine

## 2012-04-24 ENCOUNTER — Ambulatory Visit (INDEPENDENT_AMBULATORY_CARE_PROVIDER_SITE_OTHER): Payer: Managed Care, Other (non HMO) | Admitting: Internal Medicine

## 2012-04-24 ENCOUNTER — Telehealth: Payer: Self-pay | Admitting: Internal Medicine

## 2012-04-24 VITALS — BP 128/84 | HR 68 | Temp 97.8°F | Wt 154.0 lb

## 2012-04-24 DIAGNOSIS — R079 Chest pain, unspecified: Secondary | ICD-10-CM

## 2012-04-24 DIAGNOSIS — R911 Solitary pulmonary nodule: Secondary | ICD-10-CM

## 2012-04-24 DIAGNOSIS — IMO0002 Reserved for concepts with insufficient information to code with codable children: Secondary | ICD-10-CM

## 2012-04-24 DIAGNOSIS — M5135 Other intervertebral disc degeneration, thoracolumbar region: Secondary | ICD-10-CM

## 2012-04-24 DIAGNOSIS — R894 Abnormal immunological findings in specimens from other organs, systems and tissues: Secondary | ICD-10-CM

## 2012-04-24 DIAGNOSIS — R768 Other specified abnormal immunological findings in serum: Secondary | ICD-10-CM

## 2012-04-24 DIAGNOSIS — Z23 Encounter for immunization: Secondary | ICD-10-CM

## 2012-04-24 DIAGNOSIS — I2510 Atherosclerotic heart disease of native coronary artery without angina pectoris: Secondary | ICD-10-CM

## 2012-04-24 NOTE — Progress Notes (Signed)
Subjective:    Patient ID: Margaret Howard, female    DOB: [birth year], 46 y.o.   MRN: [id number]  HPI  Patient comes in today for SDA for  new problem evaluation. Night she had an episode of chest tightness atypical that she has been having off and on for the last month or so. She was seen at [facility] in August and was kept overnight for 2 days for cardiac evaluation. Was told there was some hypokinesis at rest but did not think the pain was cardiac in nature. Suspected it was a GI problem she's been on Prevacid 15 mg. She gets occasional heartburn symptoms but this was not what happened last night. She describes it as a pressure was sitting in the evening. Not associated with taking pills but was after dinner. No vomiting or diarrhea. She took a nitroglycerin and it resolved fairly quickly. Other problems she is seeing rheumatology who is doing a relook at her joint situation positive ANA she has pain in her large joints. She has a history of seeing Dr. Christella Hartigan in the GI department when she had initial chest pain that ended up being cardiac disease.  She has a history of an incidental pulmonary nodule and may be due for a followup chest CT soon She has not had her flu shot yet this year. Review of Systems Negative for fever syncope change in exercise tolerance she walks briskly 2 miles a day without symptom. Blood pressure stable. Joint pain as above no unusual fever chills UTI symptoms. Past history family history social history reviewed in the electronic medical record. Outpatient Encounter Prescriptions as of 04/24/2012  Medication Sig Dispense Refill  . aspirin 81 MG tablet Take 81 mg by mouth daily.      Marland Kitchen BLACK COHOSH PO Take 1 tablet by mouth daily.       . clopidogrel (PLAVIX) 75 MG tablet Take 75 mg by mouth daily. Dr. Celso Amy      . CRESTOR 20 MG tablet TAKE 1 TABELT BY MOUTH AT BEDTIME  90 tablet  3  . fish oil-omega-3 fatty acids 1000 MG capsule Take 2 g by mouth daily. bid      .  Ginger, Zingiber officinalis, (GINGER PO) Take by mouth.        Marland Kitchen glucosamine-chondroitin 500-400 MG tablet Take 1 tablet by mouth 3 (three) times daily.        . lansoprazole (PREVACID) 15 MG capsule       . metoprolol (TOPROL-XL) 50 MG 24 hr tablet Take 50 mg by mouth daily.        . nitroGLYCERIN (NITROSTAT) 0.4 MG SL tablet Place 1 tablet (0.4 mg total) under the tongue every 5 (five) minutes as needed for chest pain.  25 tablet  3  . Turmeric 450 MG CAPS Take 450 mg by mouth daily.           Objective:   Physical Exam BP 128/84  Pulse 68  Temp 97.8 F (36.6 C) (Oral)  Wt 154 lb (69.854 kg)  SpO2 98%  LMP 08/02/2010 Well-developed well-nourished in no acute distress HEENT is grossly normal neck without masses or bruit chest CTA BS equal cardiac S1-S2 no gallops or murmurs Abdomen:  Sof,t normal bowel sounds without hepatosplenomegaly, no guarding rebound or masses no CVA tenderness No clubbing cyanosis or edema  some oa changes  Gait  normal  Affect normal .   Assessment & Plan:   Chest pain in setting of est  cad and stent ;recent eval 2 day at [facility] for similar pain and not felt to be cardiac. Still could be esophageal cause  ; ntg can ease this pain increase PPI  To 30 mg per day and refer to her Gi for furhter opinion Has some reflux sx  Poss esoph spasm Hx of incidental pulm nodule due for fu ct in November will arrange. Joint pains under reeval dr D for  Rheum disease  Has pos ana and prev hx of FM   Flu vaccine today

## 2012-04-24 NOTE — Telephone Encounter (Signed)
Per Emergent Line: Caller: [name]/Patient; Patient Name: Margaret Howard; PCP: Berniece Andreas [facility]); Best Callback Phone Number: [phone]; Reason for call: Cough/Congestion. Caller asking for an appointment to be seen by PCP for 2-3 days of chest cold and congestion. Some tightness in chest beginning on Thurs 04/23/12. Afebrile this am. Per Cough-Adult Protocol, Emergent Symptoms Ruled out. New or worsening cough AND known cardiac or respiratory condition, See Provider within 4 hours. Caller asking to see PCP anytime after 13:00 today. Scheduled for today, Friday 10/25 at 14:15 with Dr Fabian Sharp. Caller advised to callback any new or worsening symptoms and is agreeable.

## 2012-04-24 NOTE — Assessment & Plan Note (Signed)
Plan fu routine fu for november

## 2012-04-24 NOTE — Patient Instructions (Signed)
This discomfort may well be esophageal based on context sometimes esophageal spasm will get better with nitroglycerin. In the meantime I would like you to double your dose of the PPI or acid blocker Prevacid. This will be 30 mg a day.  We'll make a referral to GI in the meantime for opinion as to need for further followup and management.  It appears you are due for a last followup of the pulmonary nodule in November which would be 12 months from the last one we can schedule this for you.  Esophageal Spasm Esophageal spasm is an uncoordinated contraction of the muscles of the esophagus (the tube which carries food from your mouth to your stomach). Normally, the muscles of the esophagus alternate between contraction and relaxation starting from the top of the esophagus and working down to the bottom. This moves the food from the mouth to the stomach. In esophageal spasm, all the muscles contract at once. This causes pain and fails to move the food along. As a result, you may have trouble swallowing.  Women are more likely than men to have esophageal spasm. The cause of the spasms is not known. Sometimes eating hot or cold foods triggers the condition and this may be due to an overly sensitive esophagus. This is not an infectious disease and cannot be passed to others. SYMPTOMS  Symptoms of esophageal spasm may include: chest pain, burning or pain with swallowing, and difficulty swallowing.  DIAGNOSIS  Esophageal spasm can be diagnosed by a test called manometry (pressure studies of the esophagus). In this test, a special tube is inserted down the esophagus. The tube measures the muscle activity of the esophagus. Abnormal contractions mixed with normal movement helps confirm the diagnosis.  A person with a hypersensitive esophagus may be diagnosed by inflating a long balloon in the person's esophagus. If this causes the same symptoms, preventive methods may work. PREVENTION  Avoid hot or cold foods if  that seems to be a trigger. PROGNOSIS  This condition does not go away, nor is treatment entirely satisfactory. Patients need to be careful of what they eat. They need to continue on medication if a useful one is found. Fortunately, the condition does not get progressively worse as time passes. Esophageal spasm does not usually lead to more serious problems but sometimes the pain can be disabling. If a person becomes afraid to eat they may become malnourished and lose weight.  TREATMENT   A procedure in which instruments of increasing size are inserted through the esophagus to enlarge (dilate) it are used.  Medications that decrease acid-production of the stomach may be used such as proton-pump inhibitors or H2-blockers.  Medications of several types can be used to relax the muscles of the esophagus.  An individual with a hypersensitive esophagus sometimes improves with low doses of medications normally used for depression.  No treatment for esophageal spasm is effective for everyone. Often several approaches will be tried before one works. In many cases, the symptoms will improve, but will not go away completely.  For severe cases, relief is obtained two-thirds of the time by cutting the muscles along the entire length of the esophagus. This is a major surgical procedure.  Your symptoms are usually the best guide to how well the treatment for esophageal spasm works. SIDE EFFECTS OF TREATMENTS  Nitrates can cause headaches and low blood pressure.  Calcium channel blockers can cause:  Feeling sick to your stomach (nausea).  Constipation and other side effects.  Antidepressants  can cause side effects that depend on the medication used. HOME CARE INSTRUCTIONS   Let your caregiver know if problems are getting worse, or if you get food stuck in your esophagus for longer than 1 hour or as directed and are unable to swallow liquid.  Take medications as directed and with permission of your  caregiver. Ask about what to do if a medication seems to get stuck in your esophagus. Only take over-the-counter or prescription medicines for pain, discomfort, or fever as directed by your caregiver.  Soft and liquid foods pass more easily than solid pieces. SEEK IMMEDIATE MEDICAL CARE IF:   You develop severe chest pain, especially if the pain is crushing or pressure-like and spreads to the arms, back, neck, or jaw, or if you have sweating, nausea, or shortness of breath. THIS COULD BE AN EMERGENCY. Do not wait to see if the pain will go away. Get medical help at once. Call 911 or 0 (operator). DO NOT drive yourself to the hospital.  Your chest pain gets worse and does not go away with rest.  You have an attack of chest pain lasting longer than usual despite rest and treatment with the medications your physician has prescribed.  You wake from sleep with chest pain or shortness of breath.  You feel dizzy or faint.  You have chest pain, not typical of your usual pain, caused by your esophagus for which you originally saw your caregiver. MAKE SURE YOU:   Understand these instructions.  Will watch your condition.  Will get help right away if you are not doing well or get worse. Document Released: 09/07/2002 Document Revised: 09/09/2011 Document Reviewed: 03/22/2008 [facility] Patient Information 2013 [place], [place].

## 2012-05-21 ENCOUNTER — Encounter: Payer: Self-pay | Admitting: Family Medicine

## 2012-05-21 ENCOUNTER — Ambulatory Visit (INDEPENDENT_AMBULATORY_CARE_PROVIDER_SITE_OTHER): Payer: Managed Care, Other (non HMO) | Admitting: Family Medicine

## 2012-05-21 VITALS — BP 110/72 | HR 80 | Temp 97.8°F | Wt 152.0 lb

## 2012-05-21 DIAGNOSIS — M549 Dorsalgia, unspecified: Secondary | ICD-10-CM

## 2012-05-21 MED ORDER — CYCLOBENZAPRINE HCL 5 MG PO TABS: 5.0000 mg | ORAL_TABLET | Freq: Three times a day (TID) | ORAL | Status: DC | PRN

## 2012-05-21 MED ORDER — TRAMADOL HCL 50 MG PO TABS: 25.0000 mg | ORAL_TABLET | Freq: Three times a day (TID) | ORAL | Status: DC | PRN

## 2012-05-21 NOTE — Patient Instructions (Addendum)
-  medication sparingly as needed  -heat for 15 minutes twice daily  -gentle activity   -if worsening or not improving please see your back doctor

## 2012-05-21 NOTE — Progress Notes (Signed)
Chief Complaint  Patient presents with  . Back Pain    since tuesday night;     HPI:  Acute visit for back pain: -per review of recent PCP notes, seeing Rheum for joint pains, FM and pos ana -back pain is located: low back on both sides -pain started: reports very long history of back problems, flare started a few days ago - has a flare once every year or so -no initiating event -pain is severe, sharp to achy pain -worse with bending standing twisting, better with: rest -treatments so far: heat, lidocaine patch, tylenol, ice -denies: weakness, radiation, numbness, fevers, loss of bowel or bladder function -on ROC has had fusion surgery on cervical spine per review of plain films from 11/2011 -[facility] was supposed to follow up with PCP in November -pt report hx of surgery on low back for DDD -in the past muscle relaxer and pain medication helped (she thinks she took T3) -reports residual chronic R sided LE weakness and lateral foot loss of sensation for previous surgery  Lymph node in neck: -saw dentist Tuesday and told had neck lymph node -seeing ENT tomorrow for this -wanted PCP to know  ROS: See pertinent positives and negatives per HPI.  Past Medical History  Diagnosis Date  . Positive ANA (antinuclear antibody)   . Arthritis     spine, feet, hands, knees  . GERD (gastroesophageal reflux disease)   . Hypertension     has been off med. > 6 mos.  . Fibromyalgia   . Lipoma of thigh     left  . Shortness of breath     with exertion  . CAD (coronary artery disease)     LHC 06/27/11: mLAD 50-60%, ostial septal perf 50%, mRCA occluded with L-R collats, EF 55% with inf HK.  She underwent FFR of he LAD (0.87 after adenosine) and this was not hemodynamically significant  . Fall 10/20/2011    Family History  Problem Relation Age of Onset  . Melanoma Mother   . Breast cancer Mother     in her 58s   . Melanoma Father   . Other Father     oral  . Osteoporosis Father    hx of hip fracture and died from med problems after this.from MI  . Coronary artery disease Father 12    History   Social History  . Marital Status: Married    Spouse Name: N/A    Number of Children: N/A  . Years of Education: N/A   Social History Main Topics  . Smoking status: Former Smoker -- 1.0 packs/day    Quit date: 03/07/1986  . Smokeless tobacco: Never Used  . Alcohol Use: Yes     Comment: socially  . Drug Use: No  . Sexually Active: None   Other Topics Concern  . 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 , Disp: , Rfl: ;  clopidogrel (PLAVIX) 75 MG tablet, Take 75 mg by mouth daily. Dr. Celso Amy, Disp: , Rfl: ;  CRESTOR 20 MG tablet, TAKE 1 TABELT BY MOUTH AT BEDTIME, Disp: 90 tablet, Rfl: 3;  fish oil-omega-3 fatty acids 1000 MG capsule, Take 2 g by mouth daily. bid, Disp: , Rfl:  Ginger, Zingiber officinalis, (GINGER PO), Take by mouth.  , Disp: , Rfl: ;  glucosamine-chondroitin 500-400 MG tablet, Take 1 tablet by mouth 3 (three) times daily.  , Disp: , Rfl: ;  lansoprazole (PREVACID) 15 MG capsule, Take 2 capsules (30 mg total) by mouth daily., Disp: 60 capsule, Rfl: 2;  metoprolol (TOPROL-XL) 50 MG 24 hr tablet, Take 50 mg by mouth daily.  , Disp: , Rfl:  nitroGLYCERIN (NITROSTAT) 0.4 MG SL tablet, Place 1 tablet (0.4 mg total) under the tongue every 5 (five) minutes as needed for chest pain., Disp: 25 tablet, Rfl: 3;  Turmeric 450 MG CAPS, Take 450 mg by mouth daily., Disp: , Rfl: ;  cyclobenzaprine (FLEXERIL) 5 MG tablet, Take 1 tablet (5 mg total) by mouth 3 (three) times daily as needed for muscle spasms., Disp: 30 tablet, Rfl: 1 traMADol (ULTRAM) 50 MG tablet, Take 0.5 tablets (25 mg total) by mouth every 8 (eight)  hours as needed for pain., Disp: 15 tablet, Rfl: 0;  [DISCONTINUED] isosorbide mononitrate (IMDUR) 60 MG 24 hr tablet, Take 1 tablet (60 mg total) by mouth daily., Disp: 90 tablet, Rfl: 3  EXAM:  Filed Vitals:   05/21/12 1002  BP: 110/72  Pulse: 80  Temp: 97.8 F (36.6 C)    There is no height on file to calculate BMI.  GENERAL: vitals reviewed and listed above, alert, oriented, appears well hydrated and in no acute distress  HEENT: atraumatic, conjunttiva clear, no obvious abnormalities on inspection of external nose and ears  NECK: no obvious masses on inspection  LUNGS: clear to auscultation bilaterally, no wheezes, rales or rhonchi, good air movement  CV: HRRR, no peripheral edema  MS: moves all extremities without noticeable abnormality Normal Gait Normal inspection of back, no obvious scoliosis or leg length descrepancy No bony TTP Soft tissue TTP at: bilat lumbar paraspinal musculature -/+ tests: neg trendelenburg,-facet loading, -SLRT, -CLRT, -FABER, -FADIR Normal muscle strength except where noted below, sensation to light touch and DTRs in LEs bilaterally L1 hip flexion, grion L2 hip add,upper ant thigh L3 knee ext (4/5 on R), above knee L4 foot dorsiflex, med lower leg, petellar reflex L5 toe ext, lat lower leg, S1 foot plantar flex, lat foot, achilles reflex   PSYCH: pleasant and cooperative, no obvious depression or anxiety  ASSESSMENT AND PLAN:  Discussed the following assessment and plan:  1. Back pain  cyclobenzaprine (FLEXERIL) 5 MG tablet, traMADol (ULTRAM) 50 MG tablet  2. LAD - advised to see ENT as scheduled and will let PCP know as she requested, asked that she have records sent to PCP  -Hx and exam c/w muscle strain versus facet arthropathy - conservative management with f/u with her back doctor if not improving. -Patient advised to return or notify a doctor immediately if symptoms worsen or persist or new concerns arise.  There are no Patient  Instructions on file for this visit.   Kriste Basque R.

## 2012-05-29 ENCOUNTER — Ambulatory Visit (INDEPENDENT_AMBULATORY_CARE_PROVIDER_SITE_OTHER)
Admission: RE | Admit: 2012-05-29 | Discharge: 2012-05-29 | Disposition: A | Discharge status: Home / Self Care | Payer: Managed Care, Other (non HMO) | Source: Ambulatory Visit | Attending: Internal Medicine | Admitting: Internal Medicine

## 2012-05-29 DIAGNOSIS — R911 Solitary pulmonary nodule: Secondary | ICD-10-CM

## 2012-06-01 ENCOUNTER — Encounter: Payer: Self-pay | Admitting: Gastroenterology

## 2012-06-01 ENCOUNTER — Ambulatory Visit (INDEPENDENT_AMBULATORY_CARE_PROVIDER_SITE_OTHER): Payer: Managed Care, Other (non HMO) | Admitting: Gastroenterology

## 2012-06-01 VITALS — BP 98/66 | HR 68 | Ht 61.0 in | Wt 155.4 lb

## 2012-06-01 DIAGNOSIS — R079 Chest pain, unspecified: Secondary | ICD-10-CM

## 2012-06-01 MED ORDER — LANSOPRAZOLE 30 MG PO CPDR: 30.0000 mg | DELAYED_RELEASE_CAPSULE | Freq: Every day | ORAL | Status: DC

## 2012-06-01 NOTE — Patient Instructions (Addendum)
Continue PPI once daily, take shortly before breakfast meal. Call if any new problems.

## 2012-06-01 NOTE — Progress Notes (Signed)
Review of gastrointestinal problems:  1. full sensation in her rectum, 2012. colonoscopy February 2012 was normal to the terminal ileum. symptoms started at the same time as her menstrual periods stopped 2. Chest pains: 2012 chest pains. Went to ER EKG and CRX were normal. The pain went down left arm, she went to ER, admitted for overnight evaluation, cardiac stress test (negative). Then started vomiting. She had ultrasound 2012 was told it was normal., EGD March 2012 showed mild gastritis, biopsies negative for H. Pylori.  Suspected non-gastrointestinal chest pains, however HIDA was ordered, she never had it done.    HPI: This is a    very pleasant 46 year old woman whom I last saw about a year and a half ago.  In June 2012 she had two bulging discs, these were removed. Was still having chest pains, in to jaws, down her arms, DOE.  Eventually repeat evaluation with cardiologist again and found to have signficant CAD.  Had angiogram, stenting in Feb 2013 at [facility].  Cardiologist Bernette Redbird, MD. She sees him periodically.  Twice she has had chest pain after drinking water. She took nitro. And pain went away.  Went again to [facility].  Found to have hypokinesis.   Since 3 months ago has been on 30mg  prevacid, once daily shortly before breakfast meal.  Overall stable weight.  No dysphagia.  No overt GI bleeding.  On plavix since stenting, probably coming off in Feb 2014.      Past Medical History  Diagnosis Date  . Positive ANA (antinuclear antibody)   . Arthritis     spine, feet, hands, knees  . GERD (gastroesophageal reflux disease)   . Hypertension     has been off med. > 6 mos.  . Fibromyalgia   . Lipoma of thigh     left  . Shortness of breath     with exertion  . CAD (coronary artery disease)     LHC 06/27/11: mLAD 50-60%, ostial septal perf 50%, mRCA occluded with L-R collats, EF 55% with inf HK.  She underwent FFR of he LAD (0.87 after adenosine) and this was not hemodynamically  significant  . Fall 10/20/2011    Past Surgical History  Procedure Date  . Spine surgery 1995    microdisecetomy and laminectomy   . Left foot 06/29/2008    scar tissue left foot  . Lipoma excision 12/13/2008    bilat. upper extremity masses  . Colonoscopy   . Foot surgery     Right  . Anterior cervical discectomy 12/12/2010    and fusion C5-6, C6-7  . Carpal tunnel release 06/29/2008; 08/2009    right; left  . Carotid stent 08/26/11    [facility]    Current Outpatient Prescriptions  Medication Sig Dispense Refill  . aspirin 81 MG tablet Take 81 mg by mouth daily.      Marland Kitchen BLACK COHOSH PO Take 1 tablet by mouth daily.       . clopidogrel (PLAVIX) 75 MG tablet Take 75 mg by mouth daily. Dr. Celso Amy      . CRESTOR 20 MG tablet TAKE 1 TABELT BY MOUTH AT BEDTIME  90 tablet  3  . cyclobenzaprine (FLEXERIL) 5 MG tablet Take 1 tablet (5 mg total) by mouth 3 (three) times daily as needed for muscle spasms.  30 tablet  1  . fish oil-omega-3 fatty acids 1000 MG capsule Take 2 g by mouth daily. bid      . Ginger, Zingiber officinalis, (  GINGER PO) Take by mouth.        Marland Kitchen glucosamine-chondroitin 500-400 MG tablet Take 1 tablet by mouth 3 (three) times daily.        . lansoprazole (PREVACID) 15 MG capsule Take 2 capsules (30 mg total) by mouth daily.  60 capsule  2  . metoprolol (TOPROL-XL) 50 MG 24 hr tablet Take 50 mg by mouth daily.        . nitroGLYCERIN (NITROSTAT) 0.4 MG SL tablet Place 1 tablet (0.4 mg total) under the tongue every 5 (five) minutes as needed for chest pain.  25 tablet  3  . traMADol (ULTRAM) 50 MG tablet Take 0.5 tablets (25 mg total) by mouth every 8 (eight) hours as needed for pain.  15 tablet  0  . Turmeric 450 MG CAPS Take 450 mg by mouth daily.      . [DISCONTINUED] isosorbide mononitrate (IMDUR) 60 MG 24 hr tablet Take 1 tablet (60 mg total) by mouth daily.  90 tablet  3    Allergies as of 06/01/2012  . (No Known Allergies)    Family History  Problem  Relation Age of Onset  . Melanoma Mother   . Breast cancer Mother     in her 71s   . Melanoma Father   . Other Father     oral  . Osteoporosis Father     hx of hip fracture and died from med problems after this.from MI  . Coronary artery disease Father 62    History   Social History  . Marital Status: Married    Spouse Name: N/A    Number of Children: N/A  . Years of Education: N/A   Occupational History  . Not on file.   Social History Main Topics  . Smoking status: Former Smoker -- 1.0 packs/day    Quit date: 03/07/1986  . Smokeless tobacco: Never Used  . Alcohol Use: Yes     Comment: socially  . Drug Use: No  . Sexually Active: Not on file   Other Topics Concern  . 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 female with chest pain 3 months ago  Her [facility] cardiologist felt that she might be having some esophageal spasm. This pain has not returned since her proton pump inhibitor was doubled. She underwent EGD in 2012, no concerning findings. She has no alarm symptoms currently. I think she should simply stay on increased dose of the proton pump inhibitor and followed clinically. I called her a new prescription for Prevacid.

## 2012-06-02 ENCOUNTER — Other Ambulatory Visit: Payer: Managed Care, Other (non HMO)

## 2012-07-07 ENCOUNTER — Encounter: Payer: Self-pay | Admitting: Internal Medicine

## 2012-07-07 ENCOUNTER — Ambulatory Visit (INDEPENDENT_AMBULATORY_CARE_PROVIDER_SITE_OTHER): Payer: BC Managed Care – PPO | Admitting: Internal Medicine

## 2012-07-07 VITALS — BP 116/80 | HR 82 | Temp 97.5°F | Wt 153.0 lb

## 2012-07-07 DIAGNOSIS — M79671 Pain in right foot: Secondary | ICD-10-CM

## 2012-07-07 DIAGNOSIS — M79609 Pain in unspecified limb: Secondary | ICD-10-CM

## 2012-07-07 NOTE — Progress Notes (Signed)
Chief Complaint  Patient presents with  . Foot Pain    Rt side.  Top of foot reaching into the leg.  Pain started the week of Christmas.  Treating with Tylenol, heat, ice and aspercreme gel.    HPI: Patient comes in today for SDA for  new problem evaluation. Onset about 2 weeks ago insidious onset and progressing of above top of right foot and goes to shin area   . Worse with plantar flexion no twist noted or fall.  Remote hx of surgery 5 years. No swelling worse at end of day .   Back has flared up and was on pred for this with help/ following up.  ROS: See pertinent positives and negatives per HPI.cardiac status is stable still on Plavix coming up on a year with her DESstent  Past Medical History  Diagnosis Date  . Positive ANA (antinuclear antibody)   . Arthritis     spine, feet, hands, knees  . GERD (gastroesophageal reflux disease)   . Hypertension     has been off med. > 6 mos.  . Fibromyalgia   . Lipoma of thigh     left  . Shortness of breath     with exertion  . CAD (coronary artery disease)     LHC 06/27/11: mLAD 50-60%, ostial septal perf 50%, mRCA occluded with L-R collats, EF 55% with inf HK.  She underwent FFR of he LAD (0.87 after adenosine) and this was not hemodynamically significant  . Fall 10/20/2011    Family History  Problem Relation Age of Onset  . Melanoma Mother   . Breast cancer Mother     in her 2s   . Melanoma Father   . Other Father     oral  . Osteoporosis Father     hx of hip fracture and died from med problems after this.from MI  . Coronary artery disease Father 53    History   Social History  . Marital Status: Married    Spouse Name: N/A    Number of Children: N/A  . Years of Education: N/A   Social History Main Topics  . Smoking status: Former Smoker -- 1.0 packs/day    Quit date: 03/07/1986  . Smokeless tobacco: Never Used  . Alcohol Use: Yes     Comment: socially  . Drug Use: No  . Sexually Active: None   Other Topics  Concern  . 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 aspirin 81 MG tablet Take 81 mg by mouth daily.      Marland Kitchen BLACK COHOSH PO Take 1 tablet by mouth daily.       . clopidogrel (PLAVIX) 75 MG tablet Take 75 mg by mouth daily. Dr. Celso Amy      . CRESTOR 20 MG tablet TAKE 1 TABELT BY MOUTH AT BEDTIME  90 tablet  3  . cyclobenzaprine (FLEXERIL) 5 MG tablet Take 1 tablet (5 mg total) by mouth 3 (three) times daily as needed for muscle spasms.  30 tablet  1  . fish oil-omega-3 fatty acids 1000 MG capsule Take 2 g by mouth daily. bid      . Ginger, Zingiber officinalis, (GINGER PO) Take by mouth.        Marland Kitchen glucosamine-chondroitin 500-400 MG tablet Take 1 tablet  by mouth 3 (three) times daily.        . lansoprazole (PREVACID) 30 MG capsule Take 1 capsule (30 mg total) by mouth daily.  90 capsule  3  . metoprolol (TOPROL-XL) 50 MG 24 hr tablet Take 50 mg by mouth daily.        . nitroGLYCERIN (NITROSTAT) 0.4 MG SL tablet Place 0.4 mg under the tongue every 5 (five) minutes as needed.      . Turmeric 450 MG CAPS Take 450 mg by mouth daily.      . [DISCONTINUED] nitroGLYCERIN (NITROSTAT) 0.4 MG SL tablet Place 1 tablet (0.4 mg total) under the tongue every 5 (five) minutes as needed for chest pain.  25 tablet  3  . [DISCONTINUED] traMADol (ULTRAM) 50 MG tablet Take 0.5 tablets (25 mg total) by mouth every 8 (eight) hours as needed for pain.  15 tablet  0    EXAM:  BP 116/80  Pulse 82  Temp 97.5 F (36.4 C) (Oral)  Wt 153 lb (69.4 kg)  SpO2 98%  LMP 08/02/2010  There is no height on file to calculate BMI.  GENERAL: vitals reviewed and listed above, alert, oriented, appears well hydrated and in no acute distress  HEENT: atraumatic, conjunctiva  clear, no obvious abnormalities on inspection of external  nose and ears OP : no lesion edema or exudate   MS: moves all extremities without noticeable focal  Abnormality right foot ankle  No joint swelling   Tender  Top of foot and rom nl no redness or crepitus  Top of shin slight bruis no hematoma .  NV seems ok .   PSYCH: pleasant and cooperative, no obvious depression or anxiety  ASSESSMENT AND PLAN:  Discussed the following assessment and plan:  1. Right foot pain  DG Foot Complete Right, DG Ankle Complete Right   poss tendinitis vs strain but  not with context   uncertain cause acts like a tendinitis or soft tissue problem. We'll get x-ray and follow symptomatic treatment may need to follow up with her foot specialist or rheumatologist. Nothing alarming about the symptoms but she has had multiple surgeries regarding her extremities and back; close followup if not improving. Cannot use anti-inflammatories at this time because of her medication and heart disease situation.  -Patient advised to return or notify health care team  immediately if symptoms worsen or persist or new concerns arise.  Patient Instructions  Get x ray foot ankle    Acts like a tendon or strain problem but   Hx not consistent of injury ibuprofen suppose could have  changed  . Your gait  In the meantime. If not getting better then see Dr Durenda Age about this  ( or the foot ortho)     Neta Mends. Panosh M.D.

## 2012-07-07 NOTE — Patient Instructions (Signed)
Get x ray foot ankle    Acts like a tendon or strain problem but   Hx not consistent of injury ibuprofen suppose could have  changed  . Your gait  In the meantime. If not getting better then see Dr Durenda Age about this  ( or the foot ortho)

## 2012-07-27 ENCOUNTER — Other Ambulatory Visit: Payer: Self-pay | Admitting: Rheumatology

## 2012-07-27 ENCOUNTER — Telehealth: Payer: Self-pay | Admitting: Internal Medicine

## 2012-07-27 DIAGNOSIS — M8580 Other specified disorders of bone density and structure, unspecified site: Secondary | ICD-10-CM

## 2012-07-27 NOTE — Telephone Encounter (Signed)
Pt rheum would like pt to have another BMD due to right stress fracture in tibia. Pt last BMD was 2009. Can I sch?

## 2012-07-27 NOTE — Telephone Encounter (Signed)
Yes !  Dx  Recurrent fractures

## 2012-07-28 NOTE — Telephone Encounter (Signed)
lmom for pt to call back

## 2012-07-28 NOTE — Telephone Encounter (Signed)
Pt is sch for 07-30-12

## 2012-07-30 ENCOUNTER — Ambulatory Visit (INDEPENDENT_AMBULATORY_CARE_PROVIDER_SITE_OTHER)
Admission: RE | Admit: 2012-07-30 | Discharge: 2012-07-30 | Disposition: A | Discharge status: Home / Self Care | Payer: BC Managed Care – PPO | Source: Ambulatory Visit

## 2012-07-30 DIAGNOSIS — M8580 Other specified disorders of bone density and structure, unspecified site: Secondary | ICD-10-CM

## 2012-07-30 DIAGNOSIS — M949 Disorder of cartilage, unspecified: Secondary | ICD-10-CM

## 2012-08-11 ENCOUNTER — Telehealth: Payer: Self-pay | Admitting: Internal Medicine

## 2012-08-11 DIAGNOSIS — M8580 Other specified disorders of bone density and structure, unspecified site: Secondary | ICD-10-CM

## 2012-08-11 NOTE — Telephone Encounter (Signed)
Patient called stating that she would like a call back with bone density results. Please assist.

## 2012-08-11 NOTE — Telephone Encounter (Signed)
Ok to give her a copy of the bone density .  It shows mild osteo penia -1.3

## 2012-08-11 NOTE — Telephone Encounter (Signed)
Patient notified by telephone. 

## 2012-09-23 ENCOUNTER — Telehealth: Payer: Self-pay | Admitting: Internal Medicine

## 2012-09-23 NOTE — Telephone Encounter (Signed)
We received notification from [facility] that this pt never went to [facility] for foot & ankle films ordered by [facility] 07/07/12. Please follow up with patient or cancel order if appropriate. Thank you.

## 2012-10-02 NOTE — Telephone Encounter (Signed)
Ok to cancel

## 2012-10-02 NOTE — Telephone Encounter (Signed)
Removed from the list.

## 2012-10-05 ENCOUNTER — Other Ambulatory Visit: Payer: Self-pay

## 2012-10-05 DIAGNOSIS — Z1231 Encounter for screening mammogram for malignant neoplasm of breast: Secondary | ICD-10-CM

## 2012-10-27 ENCOUNTER — Ambulatory Visit: Payer: Self-pay | Admitting: Obstetrics & Gynecology

## 2012-10-30 ENCOUNTER — Encounter: Payer: Self-pay | Admitting: Obstetrics and Gynecology

## 2012-10-30 ENCOUNTER — Ambulatory Visit
Admission: RE | Admit: 2012-10-30 | Discharge: 2012-10-30 | Disposition: A | Discharge status: Home / Self Care | Payer: BC Managed Care – PPO | Source: Ambulatory Visit

## 2012-10-30 DIAGNOSIS — Z1231 Encounter for screening mammogram for malignant neoplasm of breast: Secondary | ICD-10-CM

## 2012-11-02 ENCOUNTER — Ambulatory Visit (INDEPENDENT_AMBULATORY_CARE_PROVIDER_SITE_OTHER): Payer: BC Managed Care – PPO | Admitting: Obstetrics and Gynecology

## 2012-11-02 ENCOUNTER — Encounter: Payer: Self-pay | Admitting: Obstetrics and Gynecology

## 2012-11-02 VITALS — BP 120/76 | Ht 61.0 in | Wt 160.0 lb

## 2012-11-02 DIAGNOSIS — Z0000 Encounter for general adult medical examination without abnormal findings: Secondary | ICD-10-CM

## 2012-11-02 DIAGNOSIS — Z01419 Encounter for gynecological examination (general) (routine) without abnormal findings: Secondary | ICD-10-CM

## 2012-11-02 LAB — POCT URINALYSIS DIPSTICK
Blood, UA: NEGATIVE
Nitrite, UA: NEGATIVE
Urobilinogen, UA: NEGATIVE
pH, UA: 5

## 2012-11-02 NOTE — Progress Notes (Signed)
Patient ID: Margaret Howard, female   DOB: 1966/03/09, 47 y.o.   MRN: [id number] 47 y.o.  Married  Caucasian female   [phone] here for annual exam.   LMP three years ago.   Hot flashes feel better since stopping Black cohosh.  Denies vaginal dryness.   Breast biopsy guided by MRI 2 years ago.  Family history of breast cancer.  Stress fx of right tibia in January.  Gained 7 pounds over three months.  Had decreased activity due to recent fracture.    History of CAD.  Status post stent placement.  Has hypokineses of heart.    Does regular dermatology checks.  Family history of melanoma.  PCP is Dr. Darrell Jewel.  Rhuematologist is Dr. Shan Levans.    Patient's last menstrual period was 08/02/2010.          Sexually active: yes  The current method of family planning is post menopausal status.    Exercising: walking Last mammogram:  10-30-12 [facility]. Results pending. Last pap smear: 10-07-11 wnl: Neg HPV History of abnormal pap: no Smoking: no Alcohol: 7 glass red wine per week Last colonoscopy: 03/2012 wnl: Dr. Christella Hartigan, next one due 2023 Last Bone Density:  08/2012 wnl: Dr. Shan Levans  Last tetanus shot: 2013 Last cholesterol check:  03/2012  Hgb: PCP               Urine: Neg    Health Maintenance  Topic Date Due  . Tetanus/tdap  11/01/1982  . Mammogram  10/28/2012  . Influenza Vaccine  03/01/2013  . Pap Smear  10/07/2014    Family History  Problem Relation Age of Onset  . Melanoma Mother   . Breast cancer Mother 60  . Melanoma Father   . Other Father     oral  . Osteoporosis Father     hx of hip fracture and died from med problems after this.from MI  . Coronary artery disease Father 22  . Cancer Father     oral, lived 16 years  . Breast cancer Maternal Aunt     early 75's  . Colon cancer Paternal Aunt   . Cancer Paternal Aunt     ovarian  . Colon cancer Paternal Aunt   . Colon cancer Cousin 53  . Colon cancer Cousin 53    Patient Active Problem List   Diagnosis Date  Noted  . Osteopenia 08/11/2012  . Acute strain of neck muscle 10/20/2011  . Contusion 10/20/2011  . Other and unspecified hyperlipidemia 09/19/2011  . Dyslipidemia 07/23/2011  . Hematoma complicating a procedure 07/04/2011  . CAD (coronary artery disease) 06/27/2011  . Chest pain 06/04/2011  . Arthritis 01/15/2011  . Amenorrhea 01/15/2011  . Hoarseness 01/15/2011  . Swelling of ankle 11/23/2010  . Anxiety reaction 11/23/2010  . Positive ANA (antinuclear antibody) 11/23/2010  . Positive ANA (antinuclear antibody)   . DJD (degenerative joint disease), thoracolumbar 10/22/2010  . Lipoma of thigh 10/22/2010  . Incidental pulmonary nodule, greater than or equal to 8mm 09/20/2010  . ABDOMINAL PAIN RIGHT UPPER QUADRANT 09/11/2010  . Dysphagia 09/03/2010  . OTHER DYSPHAGIA 09/03/2010  . RECTAL PAIN 07/16/2010  . PELVIC  PAIN 07/16/2010  . DELAYED MENSES 05/31/2010  . MUSCLE SPASM, TRAPEZIUS MUSCLE, RIGHT 05/14/2010  . MASS, LEFT AXILLA 07/07/2009  . TOBACCO USE, QUIT 04/25/2009  . CONSTIPATION 02/22/2009  . LUMBAR RADICULOPATHY, LEFT 07/29/2008  . ADJ DISORDER WITH MIXED ANXIETY & DEPRESSED MOOD 10/19/2007  . HYPERTENSION 02/24/2007  . ARTHRALGIA 02/18/2007  .  FIBROMYALGIA 02/18/2007  . ADVEF, DRUG/MED/BIOL SUBST, OTHER DRUG NOS 01/21/2007    Past Medical History  Diagnosis Date  . Positive ANA (antinuclear antibody)   . Arthritis     spine, feet, hands, knees  . GERD (gastroesophageal reflux disease)   . Hypertension     has been off med. > 6 mos.  . Fibromyalgia   . Lipoma of thigh     left  . Shortness of breath     with exertion  . CAD (coronary artery disease)     LHC 06/27/11: mLAD 50-60%, ostial septal perf 50%, mRCA occluded with L-R collats, EF 55% with inf HK.  She underwent FFR of he LAD (0.87 after adenosine) and this was not hemodynamically significant  . Fall 10/20/2011  . Heart attack 2012    1 stent    Past Surgical History  Procedure Laterality Date   . Spine surgery  1995    microdisecetomy and laminectomy L5-S1  . Left foot  06/29/2008    scar tissue left foot  . Lipoma excision  12/13/2008    bilat. upper extremity masses  . Colonoscopy    . Foot surgery      Right, fracture repair  . Anterior cervical discectomy  12/12/2010    and fusion C5-6, C6-7  . Carpal tunnel release  06/29/2008; 08/2009    right; left  . Carotid stent  08/26/11    [facility]  . Ankle fracture surgery Left   . Breast surgery  12/11    MRI guided breast biopsy    Allergies: Review of patient's allergies indicates no known allergies.  Current Outpatient Prescriptions  Medication Sig Dispense Refill  . aspirin 81 MG tablet Take 81 mg by mouth daily.      . clopidogrel (PLAVIX) 75 MG tablet Take 75 mg by mouth daily. Dr. Celso Amy      . CRESTOR 20 MG tablet TAKE 1 TABELT BY MOUTH AT BEDTIME  90 tablet  3  . fish oil-omega-3 fatty acids 1000 MG capsule Take 2 g by mouth daily. bid      . Ginger, Zingiber officinalis, (GINGER PO) Take by mouth.        Marland Kitchen glucosamine-chondroitin 500-400 MG tablet Take 1 tablet by mouth 3 (three) times daily.        . lansoprazole (PREVACID) 30 MG capsule Take 1 capsule (30 mg total) by mouth daily.  90 capsule  3  . metoprolol (TOPROL-XL) 50 MG 24 hr tablet Take 50 mg by mouth daily.        . nitroGLYCERIN (NITROSTAT) 0.4 MG SL tablet Place 0.4 mg under the tongue every 5 (five) minutes as needed.      . Turmeric 450 MG CAPS Take 450 mg by mouth daily.      Marland Kitchen BLACK COHOSH PO Take 1 tablet by mouth daily.       . cyclobenzaprine (FLEXERIL) 5 MG tablet Take 1 tablet (5 mg total) by mouth 3 (three) times daily as needed for muscle spasms.  30 tablet  1  . [DISCONTINUED] isosorbide mononitrate (IMDUR) 60 MG 24 hr tablet Take 1 tablet (60 mg total) by mouth daily.  90 tablet  3   No current facility-administered medications for this visit.    ROS: Pertinent items are noted in HPI.  Social Hx:  Married.  Currently trying to  sell home in [facility].  Preschool teacher.  Exam:    BP 120/76  Ht 5\' 1"  (1.549  m)  Wt 160 lb (72.576 kg)  BMI 30.25 kg/m2  LMP 08/02/2010   Wt Readings from Last 3 Encounters:  11/02/12 160 lb (72.576 kg)  07/07/12 153 lb (69.4 kg)  06/01/12 155 lb 6.4 oz (70.489 kg)     Ht Readings from Last 3 Encounters:  11/02/12 5\' 1"  (1.549 m)  06/01/12 5\' 1"  (1.549 m)  12/12/11 5\' 2"  (1.575 m)    General appearance: alert, cooperative and appears stated age Head: Normocephalic, without obvious abnormality, atraumatic Neck: no adenopathy, supple, symmetrical, trachea midline and thyroid not enlarged, symmetric, no tenderness/mass/nodules Lungs: clear to auscultation bilaterally Breasts: Inspection negative, No nipple retraction or dimpling, No nipple discharge or bleeding, No axillary or supraclavicular adenopathy, Normal to palpation without dominant masses Heart: regular rate and rhythm Abdomen: soft, non-tender; bowel sounds normal; no masses,  no organomegaly Extremities: extremities normal, atraumatic, no cyanosis or edema Skin: Skin color, texture, turgor normal. No rashes or lesions Lymph nodes: Cervical, supraclavicular, and axillary nodes normal. No abnormal inguinal nodes palpated Neurologic: Grossly normal   Pelvic: External genitalia:  no lesions              Urethra:  normal appearing urethra with no masses, tenderness or lesions              Bartholins and Skenes: normal                 Vagina: normal appearing vagina with normal color and discharge, no lesions.                Cervix: normal appearance              Pap taken: no        Bimanual Exam:  Uterus:  uterus is normal size, shape, consistency and nontender                                      Adnexa: normal adnexa in size, nontender and no masses                                      Rectovaginal: Confirms                                      Anus:  normal sphincter tone, no lesions.  Firm stool in  vault.  A: normal gyn exam Postmenopausal female.     P: mammogram annually. Do self breast exams. pap smear due in 2018. return annually or prn     An After Visit Summary was printed and given to the patient.

## 2012-11-02 NOTE — Patient Instructions (Addendum)

## 2013-04-20 ENCOUNTER — Ambulatory Visit (INDEPENDENT_AMBULATORY_CARE_PROVIDER_SITE_OTHER): Payer: BC Managed Care – PPO

## 2013-04-20 DIAGNOSIS — Z23 Encounter for immunization: Secondary | ICD-10-CM

## 2013-05-31 ENCOUNTER — Other Ambulatory Visit: Payer: Self-pay | Admitting: Gastroenterology

## 2013-09-06 ENCOUNTER — Other Ambulatory Visit: Payer: Self-pay

## 2013-09-06 DIAGNOSIS — Z1231 Encounter for screening mammogram for malignant neoplasm of breast: Secondary | ICD-10-CM

## 2013-09-08 ENCOUNTER — Encounter: Payer: Self-pay | Admitting: Podiatry

## 2013-09-08 ENCOUNTER — Ambulatory Visit (INDEPENDENT_AMBULATORY_CARE_PROVIDER_SITE_OTHER): Payer: BC Managed Care – PPO | Admitting: Podiatry

## 2013-09-08 VITALS — BP 121/78 | HR 76 | Resp 16 | Ht 62.0 in | Wt 160.0 lb

## 2013-09-08 DIAGNOSIS — L600 Ingrowing nail: Secondary | ICD-10-CM

## 2013-09-08 NOTE — Progress Notes (Signed)
Subjective:     Patient ID: Margaret Howard, female   DOB: 19-Apr-1966, 48 y.o.   MRN: [id number]  HPI patient presents with painful left medial border of several months stating she's tried to trim it and soak it without relief of symptoms   Review of Systems  All other systems reviewed and are negative.       Objective:   Physical Exam  Nursing note and vitals reviewed. Constitutional: She is oriented to person, place, and time.  Cardiovascular: Intact distal pulses.   Musculoskeletal: Normal range of motion.  Neurological: She is oriented to person, place, and time.  Skin: Skin is warm.   neurovascular status intact muscle strength adequate with normal range of motion. Fill time to the digit is immediate feet are warm and arch is well developed. Incurvated medial border left hallux that is very tender when pressed and and localized in nature     Assessment:     Ingrown toenail deformity left hallux medial border    Plan:     H&P reviewed and discussed removal of the corner. Patient wants procedure and explained surgery to patient at this time. She understands risk and today I infiltrated 60 mg Xylocaine Marcaine mixture remove the medial corner exposed matrix and applied chemical 3 applications of phenol followed by alcohol lavaged and sterile dressing. Given instructions on soaks and reappoint

## 2013-09-08 NOTE — Patient Instructions (Addendum)

## 2013-09-08 NOTE — Progress Notes (Signed)
   Subjective:    Patient ID: Margaret Howard, female    DOB: 1965/10/12, 48 y.o.   MRN: [id number]  HPI Comments: "I have an ingrown"  Patient c/o painful 1st toe left, medial border of nail, for a few months. The area is slightly red and swollen. She has been soaking, using peroxide and neosporin. Not getting better.     Review of Systems  Respiratory: Positive for cough.   Hematological: Bruises/bleeds easily.       Slow to heal       Objective:   Physical Exam        Assessment & Plan:

## 2013-09-28 ENCOUNTER — Encounter: Payer: Self-pay | Admitting: Internal Medicine

## 2013-09-28 ENCOUNTER — Ambulatory Visit (INDEPENDENT_AMBULATORY_CARE_PROVIDER_SITE_OTHER): Payer: BC Managed Care – PPO | Admitting: Internal Medicine

## 2013-09-28 VITALS — BP 98/70 | Temp 97.9°F | Ht 61.0 in | Wt 168.0 lb

## 2013-09-28 DIAGNOSIS — M129 Arthropathy, unspecified: Secondary | ICD-10-CM

## 2013-09-28 DIAGNOSIS — Z79899 Other long term (current) drug therapy: Secondary | ICD-10-CM | POA: Insufficient documentation

## 2013-09-28 DIAGNOSIS — I2510 Atherosclerotic heart disease of native coronary artery without angina pectoris: Secondary | ICD-10-CM

## 2013-09-28 DIAGNOSIS — E785 Hyperlipidemia, unspecified: Secondary | ICD-10-CM

## 2013-09-28 DIAGNOSIS — I10 Essential (primary) hypertension: Secondary | ICD-10-CM

## 2013-09-28 DIAGNOSIS — R635 Abnormal weight gain: Secondary | ICD-10-CM

## 2013-09-28 DIAGNOSIS — M1990 Unspecified osteoarthritis, unspecified site: Secondary | ICD-10-CM

## 2013-09-28 NOTE — Patient Instructions (Signed)
Consider   resistance training .  Will notify you  of labs when available.  Dietary adaptation.  Plan fasting labs .   adn will get you results .

## 2013-09-28 NOTE — Progress Notes (Signed)
Chief Complaint  Patient presents with  . Follow-up    HPI: Fu medicaiton and lab monitoring   Last seen 1 year ago Had gyne check   Since last year things have been more [place]   [employer] .  Due for labs   Sees [facility] lipid to be checked .  Having a  hard time losing weight walks exercises no recent ortho problem and doing well.   Eats healthy .  bp good   orthos no acute changes   ROS: See pertinent positives and negatives per HPI. Has had skin check  R/o melanoma. Sleeps well no osa  Past Medical History  Diagnosis Date  . Positive ANA (antinuclear antibody)   . Arthritis     spine, feet, hands, knees  . GERD (gastroesophageal reflux disease)   . Hypertension     has been off med. > 6 mos.  . Fibromyalgia   . Lipoma of thigh     left  . Shortness of breath     with exertion  . CAD (coronary artery disease)     LHC 06/27/11: mLAD 50-60%, ostial septal perf 50%, mRCA occluded with L-R collats, EF 55% with inf HK.  She underwent FFR of he LAD (0.87 after adenosine) and this was not hemodynamically significant  . Fall 10/20/2011  . Heart attack 2012    1 stent    Family History  Problem Relation Age of Onset  . Melanoma Mother   . Breast cancer Mother 70  . Melanoma Father   . Other Father     oral  . Osteoporosis Father     hx of hip fracture and died from med problems after this.from MI  . Coronary artery disease Father 79  . Cancer Father     oral, lived 26 years  . Breast cancer Maternal Aunt     early 3's  . Colon cancer Paternal Aunt   . Cancer Paternal Aunt     ovarian  . Colon cancer Paternal Aunt   . Colon cancer Cousin 82  . Colon cancer Cousin 29    History   Social History  . Marital Status: Married    Spouse Name: N/A    Number of Children: N/A  . Years of Education: N/A   Social History Main Topics  . Smoking status: Former Smoker -- 1.00 packs/day    Quit date: 03/07/1986  . Smokeless  tobacco: Never Used  . Alcohol Use: Yes     Comment: 7 glasses red wine per week  . Drug Use: No  . Sexual Activity: Yes    Birth Control/ Protection: Post-menopausal   Other Topics Concern  . None   Social History Narrative   Occupation: Consulting civil engineer pre [employer]   HH of 6  Now 2  Children out of home  [place] [employer] park   4 dogs, cat, bird, frog and mouse   G3P3   Has a grandchild      [place]    Outpatient Encounter Prescriptions as of 09/28/2013  Medication Sig  . aspirin 81 MG tablet Take 81 mg by mouth daily.  Marland Kitchen CINNAMON PO Take by mouth.  . clopidogrel (PLAVIX) 75 MG tablet Take 75 mg by mouth daily. Dr. Myriam Jacobson  . CRESTOR 20 MG tablet TAKE 1 TABELT BY MOUTH AT BEDTIME  . fish oil-omega-3 fatty acids 1000 MG capsule Take 2 g by mouth daily. bid  . Ginger,  Zingiber officinalis, (GINGER PO) Take by mouth.    Marland Kitchen glucosamine-chondroitin 500-400 MG tablet Take 1 tablet by mouth 3 (three) times daily.    . lansoprazole (PREVACID) 30 MG capsule TAKE ONE CAPSULE BY MOUTH EVERY DAY  . metoprolol (TOPROL-XL) 50 MG 24 hr tablet Take 50 mg by mouth daily.    . Multiple Vitamin (VITAMIN E/FOLIC ZOXW/R-6/E-45 PO) Take by mouth.  . nitroGLYCERIN (NITROSTAT) 0.4 MG SL tablet Place 0.4 mg under the tongue every 5 (five) minutes as needed.  . Pyridoxine HCl (B-6 PO) Take by mouth.  . Turmeric 450 MG CAPS Take 450 mg by mouth daily.    EXAM:  BP 98/70  Temp(Src) 97.9 F (36.6 C) (Oral)  Ht 5\' 1"  (1.549 m)  Wt 168 lb (76.204 kg)  BMI 31.76 kg/m2  LMP 08/02/2010  Body mass index is 31.76 kg/(m^2). Wt Readings from Last 3 Encounters:  09/28/13 168 lb (76.204 kg)  09/08/13 160 lb (72.576 kg)  11/02/12 160 lb (72.576 kg)    GENERAL: vitals reviewed and listed above, alert, oriented, appears well hydrated and in no acute distress HEENT: atraumatic, conjunctiva  clear, no obvious abnormalities on inspection of external nose and ears  NECK: no obvious masses on  inspection palpation  LUNGS: clear to auscultation bilaterally, no wheezes, rales or rhonchi, good air movement CV: HRRR, no clubbing cyanosis or  peripheral edema nl cap refill  MS: moves all extremities  Nl gait  Abdomen:  Sof,t normal bowel sounds without hepatosplenomegaly, no guarding rebound or masses no CVA tenderness PSYCH: pleasant and cooperative, no obvious depression or anxiety ASSESSMENT AND PLAN:  Discussed the following assessment and plan:  Dyslipidemia - Plan: Basic metabolic panel, CBC with Differential, Hepatic function panel, Lipid panel, TSH  Medication management - Plan: Basic metabolic panel, CBC with Differential, Hepatic function panel, Lipid panel, TSH  CAD (coronary artery disease) - Plan: Basic metabolic panel, CBC with Differential, Hepatic function panel, Lipid panel, TSH  HYPERTENSION - Plan: Basic metabolic panel, CBC with Differential, Hepatic function panel, Lipid panel, TSH  Arthritis - Plan: Basic metabolic panel, CBC with Differential, Hepatic function panel, Lipid panel, TSH  Weight gain - Multifactorial stable at present discussed strategies. Lifestyle labs today Due for all labs   Fasting labs  Get results  She can get those to her [facility] cards.  Reviewed weight loss strategies etc.   Discussed getting Prevnar 13 at next visit uncertain if insurance would cover it at her age even though she has coronary disease.  -Patient advised to return or notify health care team  if symptoms worsen ,persist or new concerns arise.  Patient Instructions  Consider   resistance training .  Will notify you  of labs when available.  Dietary adaptation.  Plan fasting labs .   adn will get you results .     [name]. Annica Marinello M.D.  Pre visit review using our clinic review tool, if applicable. No additional management support is needed unless otherwise documented below in the visit note. Total visit 60mins > 50% spent counseling and coordinating care

## 2013-09-29 ENCOUNTER — Telehealth: Payer: Self-pay | Admitting: Internal Medicine

## 2013-09-29 NOTE — Telephone Encounter (Signed)
Relevant patient education assigned to patient using Emmi. ° °

## 2013-10-13 ENCOUNTER — Encounter: Payer: Self-pay | Admitting: Podiatry

## 2013-10-13 ENCOUNTER — Ambulatory Visit (INDEPENDENT_AMBULATORY_CARE_PROVIDER_SITE_OTHER): Payer: BC Managed Care – PPO | Admitting: Podiatry

## 2013-10-13 VITALS — BP 111/77 | HR 70 | Resp 16

## 2013-10-13 DIAGNOSIS — B351 Tinea unguium: Secondary | ICD-10-CM

## 2013-10-14 NOTE — Progress Notes (Signed)
Subjective:     Patient ID: Margaret Howard, female   DOB: 02-10-1966, 48 y.o.   MRN: [id number]  HPI patient points to the left big toenail and is concerned because the distal two thirds have some yellow discoloration   Review of Systems     Objective:   Physical Exam Neurovascular status intact with patient well oriented x3 and found to have discoloration in the distal two thirds of the nailbed left with a small fissure line within the nail itself    Assessment:     Probable trauma to the left hallux nail creating discoloration and probable mycosis    Plan:     H&P performed and recommended formulas 3 and healthy polishers for the toenail. If it is not improved by the fall consider laser or oral medication

## 2013-10-21 ENCOUNTER — Other Ambulatory Visit: Payer: BC Managed Care – PPO

## 2013-10-28 ENCOUNTER — Other Ambulatory Visit (INDEPENDENT_AMBULATORY_CARE_PROVIDER_SITE_OTHER): Payer: BC Managed Care – PPO

## 2013-10-28 DIAGNOSIS — M129 Arthropathy, unspecified: Secondary | ICD-10-CM

## 2013-10-28 DIAGNOSIS — E785 Hyperlipidemia, unspecified: Secondary | ICD-10-CM

## 2013-10-28 DIAGNOSIS — I2510 Atherosclerotic heart disease of native coronary artery without angina pectoris: Secondary | ICD-10-CM

## 2013-10-28 DIAGNOSIS — M1990 Unspecified osteoarthritis, unspecified site: Secondary | ICD-10-CM

## 2013-10-28 DIAGNOSIS — Z79899 Other long term (current) drug therapy: Secondary | ICD-10-CM

## 2013-10-28 DIAGNOSIS — I10 Essential (primary) hypertension: Secondary | ICD-10-CM

## 2013-10-28 LAB — CBC WITH DIFFERENTIAL/PLATELET
BASOS ABS: 0 10*3/uL (ref 0.0–0.1)
BASOS PCT: 0.4 % (ref 0.0–3.0)
EOS ABS: 0.1 10*3/uL (ref 0.0–0.7)
Eosinophils Relative: 1.8 % (ref 0.0–5.0)
HEMATOCRIT: 42.3 % (ref 36.0–46.0)
HEMOGLOBIN: 14.5 g/dL (ref 12.0–15.0)
LYMPHS ABS: 1.8 10*3/uL (ref 0.7–4.0)
Lymphocytes Relative: 31.1 % (ref 12.0–46.0)
MCHC: 34.3 g/dL (ref 30.0–36.0)
MCV: 95.1 fl (ref 78.0–100.0)
Monocytes Absolute: 0.3 10*3/uL (ref 0.1–1.0)
Monocytes Relative: 6 % (ref 3.0–12.0)
NEUTROS ABS: 3.4 10*3/uL (ref 1.4–7.7)
Neutrophils Relative %: 60.7 % (ref 43.0–77.0)
Platelets: 263 10*3/uL (ref 150.0–400.0)
RBC: 4.45 Mil/uL (ref 3.87–5.11)
RDW: 13 % (ref 11.5–14.6)
WBC: 5.7 10*3/uL (ref 4.5–10.5)

## 2013-10-28 LAB — LIPID PANEL
CHOLESTEROL: 101 mg/dL (ref 0–200)
HDL: 51.1 mg/dL (ref >39.00)
LDL Cholesterol: 41 mg/dL (ref 0–99)
Total CHOL/HDL Ratio: 2
Triglycerides: 43 mg/dL (ref 0.0–149.0)
VLDL: 8.6 mg/dL (ref 0.0–40.0)

## 2013-10-28 LAB — BASIC METABOLIC PANEL
BUN: 11 mg/dL (ref 6–23)
CHLORIDE: 106 meq/L (ref 96–112)
CO2: 25 meq/L (ref 19–32)
Calcium: 9.3 mg/dL (ref 8.4–10.5)
Creatinine, Ser: 0.8 mg/dL (ref 0.4–1.2)
GFR: 79.55 mL/min (ref >60.00)
GLUCOSE: 94 mg/dL (ref 70–99)
POTASSIUM: 4.3 meq/L (ref 3.5–5.1)
SODIUM: 139 meq/L (ref 135–145)

## 2013-10-28 LAB — TSH: TSH: 1.39 u[IU]/mL (ref 0.35–5.50)

## 2013-10-28 LAB — HEPATIC FUNCTION PANEL
ALBUMIN: 4.1 g/dL (ref 3.5–5.2)
ALK PHOS: 78 U/L (ref 39–117)
ALT: 29 U/L (ref 0–35)
AST: 31 U/L (ref 0–37)
Bilirubin, Direct: 0.1 mg/dL (ref 0.0–0.3)
TOTAL PROTEIN: 7.3 g/dL (ref 6.0–8.3)
Total Bilirubin: 0.5 mg/dL (ref 0.3–1.2)

## 2013-11-01 ENCOUNTER — Ambulatory Visit
Admission: RE | Admit: 2013-11-01 | Discharge: 2013-11-01 | Disposition: A | Discharge status: Home / Self Care | Payer: BC Managed Care – PPO | Source: Ambulatory Visit

## 2013-11-01 ENCOUNTER — Encounter: Payer: Self-pay | Admitting: Family Medicine

## 2013-11-01 ENCOUNTER — Encounter (INDEPENDENT_AMBULATORY_CARE_PROVIDER_SITE_OTHER): Payer: Self-pay

## 2013-11-01 DIAGNOSIS — Z1231 Encounter for screening mammogram for malignant neoplasm of breast: Secondary | ICD-10-CM

## 2013-11-03 ENCOUNTER — Ambulatory Visit (INDEPENDENT_AMBULATORY_CARE_PROVIDER_SITE_OTHER): Payer: BC Managed Care – PPO | Admitting: Obstetrics and Gynecology

## 2013-11-03 ENCOUNTER — Encounter: Payer: Self-pay | Admitting: Obstetrics and Gynecology

## 2013-11-03 VITALS — BP 116/70 | HR 66 | Ht 61.0 in | Wt 168.8 lb

## 2013-11-03 DIAGNOSIS — Z0000 Encounter for general adult medical examination without abnormal findings: Secondary | ICD-10-CM

## 2013-11-03 DIAGNOSIS — Z01419 Encounter for gynecological examination (general) (routine) without abnormal findings: Secondary | ICD-10-CM

## 2013-11-03 LAB — POCT URINALYSIS DIPSTICK
BILIRUBIN UA: NEGATIVE
GLUCOSE UA: NEGATIVE
KETONES UA: NEGATIVE
LEUKOCYTES UA: NEGATIVE
NITRITE UA: NEGATIVE
PH UA: 5
Protein, UA: NEGATIVE
RBC UA: NEGATIVE
Urobilinogen, UA: NEGATIVE

## 2013-11-03 NOTE — Patient Instructions (Signed)
EXERCISE AND DIET:  We recommended that you start or continue a regular exercise program for good health. Regular exercise means any activity that makes your heart beat faster and makes you sweat.  We recommend exercising at least 30 minutes per day at least 3 days a week, preferably 4 or 5.  We also recommend a diet low in fat and sugar.  Inactivity, poor dietary choices and obesity can cause diabetes, heart attack, stroke, and kidney damage, among others.    ALCOHOL AND SMOKING:  Women should limit their alcohol intake to no more than 7 drinks/beers/glasses of wine (combined, not each!) per week. Moderation of alcohol intake to this level decreases your risk of breast cancer and liver damage. And of course, no recreational drugs are part of a healthy lifestyle.  And absolutely no smoking or even second hand smoke. Most people know smoking can cause heart and lung diseases, but did you know it also contributes to weakening of your bones? Aging of your skin?  Yellowing of your teeth and nails?  CALCIUM AND VITAMIN D:  Adequate intake of calcium and Vitamin D are recommended.  The recommendations for exact amounts of these supplements seem to change often, but generally speaking 600 mg of calcium (either carbonate or citrate) and 800 units of Vitamin D per day seems prudent. Certain women may benefit from higher intake of Vitamin D.  If you are among these women, your doctor will have told you during your visit.    PAP SMEARS:  Pap smears, to check for cervical cancer or precancers,  have traditionally been done yearly, although recent scientific advances have shown that most women can have pap smears less often.  However, every woman still should have a physical exam from her gynecologist every year. It will include a breast check, inspection of the vulva and vagina to check for abnormal growths or skin changes, a visual exam of the cervix, and then an exam to evaluate the size and shape of the uterus and  ovaries.  And after 50 years of age, a rectal exam is indicated to check for rectal cancers. We will also provide age appropriate advice regarding health maintenance, like when you should have certain vaccines, screening for sexually transmitted diseases, bone density testing, colonoscopy, mammograms, etc.   MAMMOGRAMS:  All women over 40 years old should have a yearly mammogram. Many facilities now offer a "3D" mammogram, which may cost around $50 extra out of pocket. If possible,  we recommend you accept the option to have the 3D mammogram performed.  It both reduces the number of women who will be called back for extra views which then turn out to be normal, and it is better than the routine mammogram at detecting truly abnormal areas.    COLONOSCOPY:  Colonoscopy to screen for colon cancer is recommended for all women at age 50.  We know, you hate the idea of the prep.  We agree, BUT, having colon cancer and not knowing it is worse!!  Colon cancer so often starts as a polyp that can be seen and removed at colonscopy, which can quite literally save your life!  And if your first colonoscopy is normal and you have no family history of colon cancer, most women don't have to have it again for 10 years.  Once every ten years, you can do something that may end up saving your life, right?  We will be happy to help you get it scheduled when you are ready.    Be sure to check your insurance coverage so you understand how much it will cost.  It may be covered as a preventative service at no cost, but you should check your particular policy.     Exercise to Lose Weight Exercise and a healthy diet may help you lose weight. Your doctor may suggest specific exercises. EXERCISE IDEAS AND TIPS  Choose low-cost things you enjoy doing, such as walking, bicycling, or exercising to workout videos.  Take stairs instead of the elevator.  Walk during your lunch break.  Park your car further away from work or  school.  Go to a gym or an exercise class.  Start with 5 to 10 minutes of exercise each day. Build up to 30 minutes of exercise 4 to 6 days a week.  Wear shoes with good support and comfortable clothes.  Stretch before and after working out.  Work out until you breathe harder and your heart beats faster.  Drink extra water when you exercise.  Do not do so much that you hurt yourself, feel dizzy, or get very short of breath. Exercises that burn about 150 calories:  Running 1  miles in 15 minutes.  Playing volleyball for 45 to 60 minutes.  Washing and waxing a car for 45 to 60 minutes.  Playing touch football for 45 minutes.  Walking 1  miles in 35 minutes.  Pushing a stroller 1  miles in 30 minutes.  Playing basketball for 30 minutes.  Raking leaves for 30 minutes.  Bicycling 5 miles in 30 minutes.  Walking 2 miles in 30 minutes.  Dancing for 30 minutes.  Shoveling snow for 15 minutes.  Swimming laps for 20 minutes.  Walking up stairs for 15 minutes.  Bicycling 4 miles in 15 minutes.  Gardening for 30 to 45 minutes.  Jumping rope for 15 minutes.  Washing windows or floors for 45 to 60 minutes. Document Released: 07/20/2010 Document Revised: 09/09/2011 Document Reviewed: 07/20/2010 [facility] Patient Information 2014 [place], [place].  Calorie Counting Diet A calorie counting diet requires you to eat the number of calories that are right for you in a day. Calories are the measurement of how much energy you get from the food you eat. Eating the right amount of calories is important for staying at a healthy weight. If you eat too many calories, your body will store them as fat and you may gain weight. If you eat too few calories, you may lose weight. Counting the number of calories you eat during a day will help you know if you are eating the right amount. A Registered Dietitian can determine how many calories you need in a day. The amount of calories needed varies  from person to person. If your goal is to lose weight, you will need to eat fewer calories. Losing weight can benefit you if you are overweight or have health problems such as heart disease, high blood pressure, or diabetes. If your goal is to gain weight, you will need to eat more calories. Gaining weight may be necessary if you have a certain health problem that causes your body to need more energy. TIPS Whether you are increasing or decreasing the number of calories you eat during a day, it may be hard to get used to changes in what you eat and drink. The following are tips to help you keep track of the number of calories you eat.  Measure foods at home with measuring cups. This helps you know the amount of food  and number of calories you are eating.  Restaurants often serve food in amounts that are larger than 1 serving. While eating out, estimate how many servings of a food you are given. For example, a serving of cooked rice is  cup or about the size of half of a fist. Knowing serving sizes will help you be aware of how much food you are eating at restaurants.  Ask for smaller portion sizes or child-size portions at restaurants.  Plan to eat half of a meal at a restaurant. Take the rest home or share the other half with a friend.  Read the Nutrition Facts panel on food labels for calorie content and serving size. You can find out how many servings are in a package, the size of a serving, and the number of calories each serving has.  For example, a package might contain 3 cookies. The Nutrition Facts panel on that package says that 1 serving is 1 cookie. Below that, it will say there are 3 servings in the container. The calories section of the Nutrition Facts label says there are 90 calories. This means there are 90 calories in 1 cookie (1 serving). If you eat 1 cookie you have eaten 90 calories. If you eat all 3 cookies, you have eaten 270 calories (3 servings x 90 calories = 270  calories). The list below tells you how big or small some common portion sizes are.  1 oz.........4 stacked dice.  3 oz........Marland KitchenDeck of cards.  1 tsp.......Marland KitchenTip of little finger.  1 tbs......Marland KitchenMarland KitchenThumb.  2 tbs.......Marland KitchenGolf ball.   cup......Marland KitchenHalf of a fist.  1 cup.......Marland KitchenA fist. KEEP A FOOD LOG Write down every food item you eat, the amount you eat, and the number of calories in each food you eat during the day. At the end of the day, you can add up the total number of calories you have eaten. It may help to keep a list like the one below. Find out the calorie information by reading the Nutrition Facts panel on food labels. Breakfast  Bran cereal (1 cup, 110 calories).  Fat-free milk ( cup, 45 calories). Snack  Apple (1 medium, 80 calories). Lunch  Spinach (1 cup, 20 calories).  Tomato ( medium, 20 calories).  Chicken breast strips (3 oz, 165 calories).  Shredded cheddar cheese ( cup, 110 calories).  Light [place] dressing (2 tbs, 60 calories).  Whole-wheat bread (1 slice, 80 calories).  Tub margarine (1 tsp, 35 calories).  Vegetable soup (1 cup, 160 calories). Dinner  Pork chop (3 oz, 190 calories).  Brown rice (1 cup, 215 calories).  Steamed broccoli ( cup, 20 calories).  Strawberries (1  cup, 65 calories).  Whipped cream (1 tbs, 50 calories). Daily Calorie Total: 8119 Document Released: 06/17/2005 Document Revised: 09/09/2011 Document Reviewed: 12/12/2006 [facility] Patient Information 2014 [place].

## 2013-11-03 NOTE — Progress Notes (Signed)
Patient ID: Margaret Howard, female   DOB: 12/27/1965, 48 y.o.   MRN: [id number] GYNECOLOGY VISIT  PCP:   Shanon Ace, MD  Referring provider:   HPI: 48 y.o.   Married  Caucasian  female   [phone] with Patient's last menstrual period was 08/02/2010.   here for  AEX.  FSH 118.4 in 2012. Gaining weight.  Doing exercise and calorie awareness. TSH normal.   Does Dermatology visits yearly.  Family history of melanoma.   [facility].   Hgb:    PCP Urine:  Neg  GYNECOLOGIC HISTORY: Patient's last menstrual period was 08/02/2010. Sexually active:  yes Partner preference: female Contraception:  postmenopausal  Menopausal hormone therapy: no DES exposure:  no  Blood transfusions:  no  Sexually transmitted diseases:   no GYN procedures and prior surgeries:  no Last mammogram:   11-01-13:[facility]--hasn't received results.              Last pap and high risk HPV testing:   10-07-11 wnl:neg HR HPV.  History of abnormal pap smear:  no   OB History   Grav Para Term Preterm Abortions TAB SAB Ect Mult Living   3 3 3       3        LIFESTYLE: Exercise:   Walking 2 1/2 miles daily and yoga            Tobacco:   no Alcohol:      5 glasses of wine per week Drug use:  no  OTHER HEALTH MAINTENANCE: Tetanus/TDap:  2012 Gardisil:             n/a Influenza:           03/2013 Zostavax:            n/a  Bone density:    2014 with Dr. Deveshwar:osteopenia Colonoscopy:    09/2010 normal with Dr. Ardis Hughs.  Next colonoscopy due 09/2020.  Cholesterol check:  wnl  Family History  Problem Relation Age of Onset  . Melanoma Mother   . Breast cancer Mother 62  . Hypertension Mother   . Melanoma Father   . Other Father     oral  . Osteoporosis Father     hx of hip fracture and died from med problems after this.from MI  . Coronary artery disease Father 36  . Cancer Father     oral, lived 68 years  . Breast cancer Maternal Aunt     early 14's  . Colon cancer Paternal Aunt    . Cancer Paternal Aunt     ovarian  . Ovarian cancer Paternal Aunt   . Colon cancer Paternal Aunt   . Colon cancer Cousin 53  . Colon cancer Cousin 53    Patient Active Problem List   Diagnosis Date Noted  . Medication management 09/28/2013  . Weight gain 09/28/2013  . Osteopenia 08/11/2012  . Contusion 10/20/2011  . Other and unspecified hyperlipidemia 09/19/2011  . Dyslipidemia 07/23/2011  . CAD (coronary artery disease) 06/27/2011  . Chest pain 06/04/2011  . Arthritis 01/15/2011  . Amenorrhea 01/15/2011  . Swelling of ankle 11/23/2010  . Anxiety reaction 11/23/2010  . Positive ANA (antinuclear antibody) 11/23/2010  . Positive ANA (antinuclear antibody)   . DJD (degenerative joint disease), thoracolumbar 10/22/2010  . Lipoma of thigh 10/22/2010  . Incidental pulmonary nodule, greater than or equal to 74mm 09/20/2010  . ABDOMINAL PAIN RIGHT UPPER QUADRANT 09/11/2010  . Dysphagia 09/03/2010  .  OTHER DYSPHAGIA 09/03/2010  . RECTAL PAIN 07/16/2010  . PELVIC  PAIN 07/16/2010  . DELAYED MENSES 05/31/2010  . MUSCLE SPASM, TRAPEZIUS MUSCLE, RIGHT 05/14/2010  . MASS, LEFT AXILLA 07/07/2009  . TOBACCO USE, QUIT 04/25/2009  . CONSTIPATION 02/22/2009  . LUMBAR RADICULOPATHY, LEFT 07/29/2008  . ADJ DISORDER WITH MIXED ANXIETY & DEPRESSED MOOD 10/19/2007  . HYPERTENSION 02/24/2007  . ARTHRALGIA 02/18/2007  . FIBROMYALGIA 02/18/2007  . ADVEF, DRUG/MED/BIOL SUBST, OTHER DRUG NOS 01/21/2007   Past Medical History  Diagnosis Date  . Positive ANA (antinuclear antibody)   . Arthritis     spine, feet, hands, knees  . GERD (gastroesophageal reflux disease)   . Hypertension     has been off med. > 6 mos.  . Fibromyalgia   . Lipoma of thigh     left  . Shortness of breath     with exertion  . CAD (coronary artery disease)     LHC 06/27/11: mLAD 50-60%, ostial septal perf 50%, mRCA occluded with L-R collats, EF 55% with inf HK.  She underwent FFR of he LAD (0.87 after adenosine)  and this was not hemodynamically significant  . Fall 10/20/2011  . Heart attack 2012    1 stent    Past Surgical History  Procedure Laterality Date  . Spine surgery  1995    microdisecetomy and laminectomy L5-S1  . Left foot  06/29/2008    scar tissue left foot  . Lipoma excision  12/13/2008    bilat. upper extremity masses  . Colonoscopy    . Foot surgery      Right, fracture repair  . Anterior cervical discectomy  12/12/2010    and fusion C5-6, C6-7  . Carpal tunnel release  06/29/2008; 08/2009    right; left  . Carotid stent  08/26/11    [facility]  . Ankle fracture surgery Left   . Breast surgery  12/11    MRI guided breast biopsy    ALLERGIES: Review of patient's allergies indicates no known allergies.  Current Outpatient Prescriptions  Medication Sig Dispense Refill  . aspirin 81 MG tablet Take 81 mg by mouth daily.      Marland Kitchen CINNAMON PO Take by mouth.      . clopidogrel (PLAVIX) 75 MG tablet Take 75 mg by mouth daily. Dr. Myriam Jacobson      . CRESTOR 20 MG tablet TAKE 1 TABELT BY MOUTH AT BEDTIME  90 tablet  3  . fish oil-omega-3 fatty acids 1000 MG capsule Take 2 g by mouth daily. bid      . Ginger, Zingiber officinalis, (GINGER PO) Take by mouth.        Marland Kitchen glucosamine-chondroitin 500-400 MG tablet Take 1 tablet by mouth 3 (three) times daily.        . lansoprazole (PREVACID) 30 MG capsule TAKE ONE CAPSULE BY MOUTH EVERY DAY  90 capsule  3  . metoprolol (TOPROL-XL) 50 MG 24 hr tablet Take 50 mg by mouth daily.        . nitroGLYCERIN (NITROSTAT) 0.4 MG SL tablet Place 0.4 mg under the tongue every 5 (five) minutes as needed.      . Pyridoxine HCl (B-6 PO) Take by mouth.      . Turmeric 450 MG CAPS Take 450 mg by mouth daily.      . [DISCONTINUED] isosorbide mononitrate (IMDUR) 60 MG 24 hr tablet Take 1 tablet (60 mg total) by mouth daily.  90 tablet  3   No  current facility-administered medications for this visit.     ROS:  Pertinent items are noted in HPI.  SOCIAL  HISTORY:  Pharmacist, hospital.  PHYSICAL EXAMINATION:    BP 116/70  Pulse 66  Ht 5\' 1"  (1.549 m)  Wt 168 lb 12.8 oz (76.567 kg)  BMI 31.91 kg/m2  LMP 08/02/2010   Wt Readings from Last 3 Encounters:  11/03/13 168 lb 12.8 oz (76.567 kg)  09/28/13 168 lb (76.204 kg)  09/08/13 160 lb (72.576 kg)     Ht Readings from Last 3 Encounters:  11/03/13 5\' 1"  (1.549 m)  09/28/13 5\' 1"  (1.549 m)  09/08/13 5\' 2"  (1.575 m)    General appearance: alert, cooperative and appears stated age Head: Normocephalic, without obvious abnormality, atraumatic Neck: no adenopathy, supple, symmetrical, trachea midline and thyroid not enlarged, symmetric, no tenderness/mass/nodules Lungs: clear to auscultation bilaterally Breasts: Inspection negative, No nipple retraction or dimpling, No nipple discharge or bleeding, No axillary or supraclavicular adenopathy, Normal to palpation without dominant masses Heart: regular rate and rhythm Abdomen: soft, non-tender; no masses,  no organomegaly Extremities: extremities normal, atraumatic, no cyanosis or edema Skin: Skin color, texture, turgor normal. No rashes or lesions Lymph nodes: Cervical, supraclavicular, and axillary nodes normal. No abnormal inguinal nodes palpated Neurologic: Grossly normal  Pelvic: External genitalia:  no lesions              Urethra:  normal appearing urethra with no masses, tenderness or lesions              Bartholins and Skenes: normal                 Vagina: normal appearing vagina with normal color and discharge, no lesions              Cervix: normal appearance              Pap and high risk HPV testing done: no.            Bimanual Exam:  Uterus:  uterus is normal size, shape, consistency and nontender - exam limited by body habitus.                                       Adnexa: normal adnexa in size, nontender and no masses                                      Rectovaginal: Confirms                                      Anus:  normal  sphincter tone, no lesions  ASSESSMENT  Normal gynecologic exam. Overweight.  PLAN  Mammogram recommended yearly.  Pap smear and high risk HPV testing Counseled on self breast exam, Calcium and vitamin D intake, exercise and diet for weight loss.  Return annually or prn   An After Visit Summary was printed and given to the patient.

## 2013-12-29 ENCOUNTER — Encounter: Payer: Self-pay | Admitting: Physician Assistant

## 2013-12-29 ENCOUNTER — Ambulatory Visit (INDEPENDENT_AMBULATORY_CARE_PROVIDER_SITE_OTHER): Payer: BC Managed Care – PPO | Admitting: Physician Assistant

## 2013-12-29 VITALS — BP 122/88 | HR 66 | Temp 97.7°F | Resp 18 | Wt 169.0 lb

## 2013-12-29 DIAGNOSIS — M79661 Pain in right lower leg: Secondary | ICD-10-CM

## 2013-12-29 DIAGNOSIS — M79609 Pain in unspecified limb: Secondary | ICD-10-CM

## 2013-12-29 NOTE — Progress Notes (Signed)
Pre visit review using our clinic review tool, if applicable. No additional management support is needed unless otherwise documented below in the visit note. 

## 2013-12-29 NOTE — Patient Instructions (Signed)
You'll be called to set up a time for a lower extremity ultrasound to rule out a deep vein thrombosis.  We will call you with the results of this and any necessary changes in therapy when the results are available.  Continue all of your current medications.  If emergency symptoms discussed during visit developed, seek medical attention immediately.  Followup as needed, or for worsening or persistent symptoms despite treatment.   Venous Thromboembolism, Prevention A venous thromboembolism is a blood clot that forms in a vein. A blood clot in a deep vein is called a deep venous thrombosis (DVT). A blood clot in the lungs is called a pulmonary embolism (PE). Blood clots are dangerous and can cause death. Blood clots can form in the:  Lungs.  Legs.  Arms. CAUSES  A blood clot can form in a vein from different conditions. A blood clot can develop due to:  Blood flow within a vein that is sluggish or very slow.  Medical conditions that make the blood clot easily.  Vein damage. RISK FACTORS Risk factors can increase your risk of developing a blood clot. Risk factors can include:  Smoking.  Obesity.  Age.  Immobility or sedentary lifestyle.  Sitting or standing for long periods of time.  Chronic or long-term bedrest.  Medical or past history of blood clots.  Family history of blood clots.  Hip, leg, or pelvis injury or trauma.  Major surgery, especially surgery on the hip, knee, or abdomen.  Pregnancy and childbirth.  Birth control pills and hormone replacement therapy.  Medical conditions such as  Peripheral vascular disease (PVD).  Diabetes.  Cancer. SYMPTOMS  Symptoms of VTE can depend on where the clot is located and if the clot breaks off and travels to another organ. Sometimes, there may be no symptoms.   DVT symptoms can include:  Swelling of the leg or arm, especially on one side.  Warmth and redness of the leg or arm, especially on one  side.  Pain in an arm or leg. Leg pain may be more noticeable or worse when standing or walking.  PE symptoms can include:  Shortness of breath.  Coughing.  Coughing up blood or blood-tinged mucus (hemoptysis).  Chest pain or chest pain with deep breaths (pleuritic chest pain).  Apprehension, anxiety, or a feeling of impending doom.  Rapid heartbeat. PREVENTION  Exercise regularly. Take a brisk 30 minute walk every day. Staying active and moving around can help prevent blood clots.  Avoid sitting or lying in bed for long periods of time. Change your position often, especially during a long trip.  Women, especially those over the age of 8, should consider the risks and benefits of taking estrogen medicines. This includes birth control pills and hormone replacement therapy.  Do not smoke, especially if you take estrogen medicines. If you smoke, talk to your caregiver on how to quit.  Eat plenty of fruits and vegetables. Ask your caregiver or dietitian if there are foods you should avoid.  Maintain a weight as suggested by your caregiver.  Wear loose-fitting clothing. Avoid constrictive or tight clothing around your legs or waist.  Try not to bump or injure your legs. Avoid crossing your legs when you are sitting.  Do not use pillows under your knees unless told by your caregiver.  Take all medicines that your caregiver prescribes you.  Wear special stockings (compression stockings or TED hose) if your caregiver prescribes them.  Wearing compression stockings (support hose) can make the leg  veins more narrow. This increases blood flow in the legs and can help prevent blood clots.  It is important to wear compression stockings correctly. Do not let them bunch up when you are wearing them. TRAVEL Long distance travel can increase the risk of a blood clot. To prevent a blood clot when traveling:  You should exercise your legs by walking or by pumping your muscles every hour.  To help prevent poor circulation on long trips, stand, stretch, and walk up and down the aisle of your airplane, train, or bus as often as possible to get the blood moving.  Do squats if you are able. If you are unable to do squats, raise your foot on the balls of your feet and tighten your lower leg muscles (particularly the calve muscles) while seated. Pointing (flexing and extending) your toes while tightening your calves while seated are also good exercises to do every hour during long trips. They help increase blood flow and reduce risk of DVT.  Stay well hydrated. Drink water regularly when traveling, especially when you are sitting or immobile for long periods of time.  Use of drugs to prevent DVT during routine travel is not generally recommended. Before taking any drugs to reduce risk of DVT, consult your caregiver. SURGERY AND HOSPITALIZATION  People who are at high risk for a blood clot may be given a blood thinning medicine (anticoagulant) when they are hospitalized even if they are not going to have surgery.  A long trip prior to surgery can increase the risk of a clot for patients undergoing hip and knee replacements. Talk to your caregiver about travel plans before your surgery.  After hip or knee surgery, your caregiver may give you anticoagulants to help prevent blood clots.  Anticoagulants may be given to people at high risk of developing thromboembolism, before, during, or sometimes after surgery, including people with clotting disorders or with a history of past thromboembolism. TRAVEL AFTER SURGERY  In orthopedic surgery, the cutting of bones prompts the body to increase clotting factors in the blood. Due to the size of the bones involved in hip and knee replacements, there is a higher risk of blood clotting than other orthopedic surgeries.  There is a risk of clotting for up to 4-6 weeks after surgery. Flying or traveling long distances can increase your risk of a clot. As a  result, those who travel long distances may need additional preventive measures after their procedure.  Drink only non-alcoholic beverages during your flight, train, or car travel. Alcohol can dehydrate you and increase your risk of getting blood clots. SEEK IMMEDIATE MEDICAL CARE IF:   You develop chest pain.  You develop severe shortness of breath.  You have breathing problems after traveling.  You develop swelling or pain in the leg.  You begin to cough up bloody mucus or phlegm (sputum).  You feel dizzy or faint. Document Released: 06/05/2009 Document Revised: 03/11/2012 Document Reviewed: 06/05/2009 [facility] Patient Information 2015 [place], [place]. This information is not intended to replace advice given to you by your health care provider. Make sure you discuss any questions you have with your health care provider.

## 2013-12-29 NOTE — Progress Notes (Signed)
Subjective:    Patient ID: Margaret Howard, female    DOB: Feb 21, 1966, 48 y.o.   MRN: [id number]  Leg Pain  The incident occurred 2 days ago. There was no injury mechanism. The pain is present in the right leg. Quality: throbbing. The pain is at a severity of 3/10. The pain is mild. The pain has been constant (worse at night) since onset. Pertinent negatives include no inability to bear weight, loss of motion, loss of sensation, muscle weakness, numbness or tingling. She reports no foreign bodies present. Nothing aggravates the symptoms. She has tried elevation, non-weight bearing and rest (currently on aspirin an plavix.) for the symptoms. The treatment provided no relief.      Review of Systems  Constitutional: Negative for fever and chills.  Respiratory: Negative for cough, chest tightness and shortness of breath.   Cardiovascular: Negative for chest pain, palpitations and leg swelling.  Gastrointestinal: Negative for nausea, vomiting and diarrhea.  Neurological: Negative for tingling and numbness.  All other systems reviewed and are negative.    Past Medical History  Diagnosis Date  . Positive ANA (antinuclear antibody)   . Arthritis     spine, feet, hands, knees  . GERD (gastroesophageal reflux disease)   . Hypertension     has been off med. > 6 mos.  . Fibromyalgia   . Lipoma of thigh     left  . Shortness of breath     with exertion  . CAD (coronary artery disease)     LHC 06/27/11: mLAD 50-60%, ostial septal perf 50%, mRCA occluded with L-R collats, EF 55% with inf HK.  She underwent FFR of he LAD (0.87 after adenosine) and this was not hemodynamically significant  . Fall 10/20/2011  . Heart attack 2012    1 stent    History   Social History  . Marital Status: Married    Spouse Name: N/A    Number of Children: N/A  . Years of Education: N/A   Occupational History  . Not on file.   Social History Main Topics  . Smoking status: Former Smoker -- 1.00 packs/day    Quit date: 03/07/1986  . Smokeless tobacco: Never Used  . Alcohol Use: 2.5 oz/week    5 drink(s) per week     Comment: 5 glasses red wine per week  . Drug Use: No  . Sexual Activity: Yes    Partners: Male    Birth Control/ Protection: Post-menopausal   Other Topics Concern  . Not on file   Social History Narrative   Occupation: Consulting civil engineer pre [employer]   HH of 6  Now 2  Children out of home  [place] [employer] [place]   4 dogs, cat, bird, frog and mouse   G3P3   Has a grandchild      [place]    Past Surgical History  Procedure Laterality Date  . Spine surgery  1995    microdisecetomy and laminectomy L5-S1  . Left foot  06/29/2008    scar tissue left foot  . Lipoma excision  12/13/2008    bilat. upper extremity masses  . Colonoscopy    . Foot surgery      Right, fracture repair  . Anterior cervical discectomy  12/12/2010    and fusion C5-6, C6-7  . Carpal tunnel release  06/29/2008; 08/2009    right; left  . Carotid stent  08/26/11    [facility]  . Ankle fracture surgery Left   . Breast  surgery  12/11    MRI guided breast biopsy    Family History  Problem Relation Age of Onset  . Melanoma Mother   . Breast cancer Mother 51  . Hypertension Mother   . Melanoma Father   . Other Father     oral  . Osteoporosis Father     hx of hip fracture and died from med problems after this.from MI  . Coronary artery disease Father 82  . Cancer Father     oral, lived 15 years  . Breast cancer Maternal Aunt     early 65's  . Colon cancer Paternal Aunt   . Cancer Paternal Aunt     ovarian  . Ovarian cancer Paternal Aunt   . Colon cancer Paternal Aunt   . Colon cancer Cousin 65  . Colon cancer Cousin 78    No Known Allergies  Current Outpatient Prescriptions on File Prior to Visit  Medication Sig Dispense Refill  . aspirin 81 MG tablet Take 81 mg by mouth daily.      Marland Kitchen CINNAMON PO Take by mouth.      . clopidogrel (PLAVIX) 75 MG tablet Take 75 mg by mouth daily. Dr. Myriam Jacobson      . CRESTOR 20 MG tablet TAKE 1 TABELT BY MOUTH AT BEDTIME  90 tablet  3  . fish oil-omega-3 fatty acids 1000 MG capsule Take 2 g by mouth daily. bid      . Ginger, Zingiber officinalis, (GINGER PO) Take by mouth.        Marland Kitchen glucosamine-chondroitin 500-400 MG tablet Take 1 tablet by mouth 3 (three) times daily.        . lansoprazole (PREVACID) 30 MG capsule TAKE ONE CAPSULE BY MOUTH EVERY DAY  90 capsule  3  . metoprolol (TOPROL-XL) 50 MG 24 hr tablet Take 50 mg by mouth daily.        . nitroGLYCERIN (NITROSTAT) 0.4 MG SL tablet Place 0.4 mg under the tongue every 5 (five) minutes as needed.      . Pyridoxine HCl (B-6 PO) Take by mouth.      . Turmeric 450 MG CAPS Take 450 mg by mouth daily.      . [DISCONTINUED] isosorbide mononitrate (IMDUR) 60 MG 24 hr tablet Take 1 tablet (60 mg total) by mouth daily.  90 tablet  3   No current facility-administered medications on file prior to visit.    EXAM: BP 122/88  Pulse 66  Temp(Src) 97.7 F (36.5 C) (Oral)  Resp 18  Wt 169 lb (76.658 kg)  LMP 08/02/2010     Objective:   Physical Exam  Nursing note and vitals reviewed. Constitutional: She is oriented to person, place, and time. She appears well-developed and well-nourished. No distress.  HENT:  Head: Normocephalic and atraumatic.  Eyes: Conjunctivae and EOM are normal. Pupils are equal, round, and reactive to light.  Neck: Normal range of motion.  Cardiovascular: Normal rate, regular rhythm and intact distal pulses.   Pulmonary/Chest: Effort normal and breath sounds normal. No respiratory distress. She exhibits no tenderness.  Musculoskeletal: Normal range of motion. She exhibits no edema and no tenderness.  Neurological: She is alert and oriented to person, place, and time.  Skin: Skin is warm and dry. No rash noted. She is not diaphoretic. No erythema. No pallor.  Small amount bruising to the right calf. No obvious injury or wound. No erythema, swelling, TTP, warmth,  fluctuance.  Psychiatric: She has a normal  mood and affect. Her behavior is normal. Judgment and thought content normal.     Lab Results  Component Value Date   WBC 5.7 10/28/2013   HGB 14.5 10/28/2013   HCT 42.3 10/28/2013   PLT 263.0 10/28/2013   GLUCOSE 94 10/28/2013   CHOL 101 10/28/2013   TRIG 43.0 10/28/2013   HDL 51.10 10/28/2013   LDLCALC 41 10/28/2013   ALT 29 10/28/2013   AST 31 10/28/2013   NA 139 10/28/2013   K 4.3 10/28/2013   CL 106 10/28/2013   CREATININE 0.8 10/28/2013   BUN 11 10/28/2013   CO2 25 10/28/2013   TSH 1.39 10/28/2013   INR 1.0 06/21/2011        Assessment & Plan:  Billy was seen today for leg pain.  Diagnoses and associated orders for this visit:  Calf pain, right Comments: With bruising. Pt on plavix and aspirin, will get [place] to rule out DVT. - Lower Extremity Venous Duplex Right; Future    Spoke with Dr. Regis Bill, due to pts bizarre history of atypical symptoms for severe illness, she recommends going ahead with stat ultrasound to rule out DVT. Discussed this with patient, patient agrees. Order placed, will alter therapy pending results.  Return precautions provided, and patient handout on DVT prevention.  Plan to follow up as needed, or for worsening or persistent symptoms despite treatment.  Patient Instructions  You'll be called to set up a time for a lower extremity ultrasound to rule out a deep vein thrombosis.  We will call you with the results of this and any necessary changes in therapy when the results are available.  Continue all of your current medications.  If emergency symptoms discussed during visit developed, seek medical attention immediately.  Followup as needed, or for worsening or persistent symptoms despite treatment.

## 2013-12-30 ENCOUNTER — Telehealth: Payer: Self-pay | Admitting: Internal Medicine

## 2013-12-30 ENCOUNTER — Ambulatory Visit (HOSPITAL_COMMUNITY): Payer: BC Managed Care – PPO | Attending: Internal Medicine | Admitting: Radiology

## 2013-12-30 DIAGNOSIS — M7989 Other specified soft tissue disorders: Secondary | ICD-10-CM | POA: Insufficient documentation

## 2013-12-30 DIAGNOSIS — I2510 Atherosclerotic heart disease of native coronary artery without angina pectoris: Secondary | ICD-10-CM | POA: Insufficient documentation

## 2013-12-30 DIAGNOSIS — E785 Hyperlipidemia, unspecified: Secondary | ICD-10-CM | POA: Insufficient documentation

## 2013-12-30 DIAGNOSIS — M79609 Pain in unspecified limb: Secondary | ICD-10-CM | POA: Insufficient documentation

## 2013-12-30 DIAGNOSIS — I10 Essential (primary) hypertension: Secondary | ICD-10-CM | POA: Insufficient documentation

## 2013-12-30 DIAGNOSIS — M79661 Pain in right lower leg: Secondary | ICD-10-CM

## 2013-12-30 NOTE — Telephone Encounter (Signed)
Pt calling back b/c no one had  Called this am and she called [facility]. They had no record of her referral.  Advised pt to pls give a little more time and someone will cb as soon as they can.  Thanks!

## 2013-12-30 NOTE — Progress Notes (Signed)
Right lower extremity venous performed.

## 2013-12-30 NOTE — Telephone Encounter (Signed)
I called the pt to inform her of her scheduled appt for today 12-30-2013@4 :15 pm pt made aware

## 2014-03-28 ENCOUNTER — Other Ambulatory Visit: Payer: Self-pay | Admitting: Family Medicine

## 2014-04-06 ENCOUNTER — Ambulatory Visit (INDEPENDENT_AMBULATORY_CARE_PROVIDER_SITE_OTHER): Payer: BC Managed Care – PPO | Admitting: Family Medicine

## 2014-04-06 DIAGNOSIS — Z23 Encounter for immunization: Secondary | ICD-10-CM

## 2014-04-26 ENCOUNTER — Telehealth: Payer: Self-pay | Admitting: Obstetrics and Gynecology

## 2014-04-26 NOTE — Telephone Encounter (Signed)
Dr cx appt lmtcb to rs °

## 2014-05-02 ENCOUNTER — Encounter: Payer: Self-pay | Admitting: Physician Assistant

## 2014-05-24 ENCOUNTER — Other Ambulatory Visit: Payer: Self-pay | Admitting: Gastroenterology

## 2014-06-06 ENCOUNTER — Telehealth: Payer: Self-pay | Admitting: Gastroenterology

## 2014-06-06 MED ORDER — LANSOPRAZOLE 30 MG PO CPDR
30.0000 mg | DELAYED_RELEASE_CAPSULE | Freq: Every day | ORAL | Status: DC
Start: 2014-06-06 — End: 2014-08-08

## 2014-06-06 NOTE — Telephone Encounter (Signed)
rx sent to last until appt

## 2014-06-08 ENCOUNTER — Encounter (HOSPITAL_COMMUNITY): Payer: Self-pay | Admitting: Cardiovascular Disease

## 2014-07-14 ENCOUNTER — Encounter (HOSPITAL_COMMUNITY): Payer: Self-pay | Admitting: Cardiovascular Disease

## 2014-08-01 ENCOUNTER — Other Ambulatory Visit: Payer: Self-pay | Admitting: Gastroenterology

## 2014-08-08 ENCOUNTER — Ambulatory Visit (INDEPENDENT_AMBULATORY_CARE_PROVIDER_SITE_OTHER): Payer: BLUE CROSS/BLUE SHIELD | Admitting: Gastroenterology

## 2014-08-08 ENCOUNTER — Encounter: Payer: Self-pay | Admitting: Gastroenterology

## 2014-08-08 VITALS — BP 100/70 | HR 64 | Ht 61.0 in | Wt 164.0 lb

## 2014-08-08 DIAGNOSIS — K219 Gastro-esophageal reflux disease without esophagitis: Secondary | ICD-10-CM

## 2014-08-08 MED ORDER — LANSOPRAZOLE 30 MG PO CPDR: 30.0000 mg | DELAYED_RELEASE_CAPSULE | Freq: Every day | ORAL | Status: DC

## 2014-08-08 NOTE — Progress Notes (Signed)
Review of gastrointestinal problems:  1. full sensation in her rectum, 2012. colonoscopy February 2012 was normal to the terminal ileum. symptoms started at the same time as her menstrual periods stopped 2. Chest pains: 2012 chest pains. Went to ER EKG and CRX were normal. The pain went down left arm, she went to ER, admitted for overnight evaluation, cardiac stress test (negative). Then started vomiting. She had ultrasound 2012 was told it was normal., EGD March 2012 showed mild gastritis, biopsies negative for H. Pylori. Suspected non-gastrointestinal chest pains, however HIDA was ordered, she never had it done.   Symptoms completely resolved after she increased PPI to twice daily in 2012.  HPI: This is a very pleasant 49 year old woman whom I last saw a little over 2 years ago.  Has been on lansoprazole 30 mg  once daily. If she misses a dose or two, she will have pyrosis.  Usually takes one pill in AM, before BF.  Overall her weight has increased by 10 pounds in past year.  No dysphagia.    Past Medical History  Diagnosis Date  . Positive ANA (antinuclear antibody)   . Arthritis     spine, feet, hands, knees  . GERD (gastroesophageal reflux disease)   . Hypertension     has been off med. > 6 mos.  . Fibromyalgia   . Lipoma of thigh     left  . Shortness of breath     with exertion  . CAD (coronary artery disease)     LHC 06/27/11: mLAD 50-60%, ostial septal perf 50%, mRCA occluded with L-R collats, EF 55% with inf HK.  She underwent FFR of he LAD (0.87 after adenosine) and this was not hemodynamically significant  . Fall 10/20/2011  . Heart attack 2012    1 stent    Past Surgical History  Procedure Laterality Date  . Spine surgery  1995    microdisecetomy and laminectomy L5-S1  . Left foot  06/29/2008    scar tissue left foot  . Lipoma excision  12/13/2008    bilat. upper extremity masses  . Colonoscopy    . Foot surgery      Right, fracture repair  . Anterior  cervical discectomy  12/12/2010    and fusion C5-6, C6-7  . Carpal tunnel release  06/29/2008; 08/2009    right; left  . Carotid stent  08/26/11    [facility]  . Ankle fracture surgery Left   . Breast surgery  12/11    MRI guided breast biopsy  . Fractional flow reserve wire N/A 06/27/2011    Procedure: FRACTIONAL FLOW RESERVE WIRE;  Surgeon: Burnell Blanks, MD;  Location: [facility] CATH LAB;  Service: Cardiovascular;  Laterality: N/A;    Current Outpatient Prescriptions  Medication Sig Dispense Refill  . aspirin 81 MG tablet Take 81 mg by mouth daily.    . carvedilol (COREG) 6.25 MG tablet Take by mouth.    Marland Kitchen CINNAMON PO Take by mouth.    . clopidogrel (PLAVIX) 75 MG tablet Take 75 mg by mouth daily. Dr. Myriam Jacobson    . CRESTOR 20 MG tablet TAKE 1 TABELT BY MOUTH AT BEDTIME 90 tablet 3  . fish oil-omega-3 fatty acids 1000 MG capsule Take 2 g by mouth daily. bid    . Ginger, Zingiber officinalis, (GINGER PO) Take by mouth.      Marland Kitchen glucosamine-chondroitin 500-400 MG tablet Take 1 tablet by mouth 3 (three) times daily.      Marland Kitchen  lansoprazole (PREVACID) 30 MG capsule Take 1 capsule (30 mg total) by mouth daily. 30 capsule 1  . lisinopril (PRINIVIL,ZESTRIL) 10 MG tablet Take 10 mg by mouth daily.    . nitroGLYCERIN (NITROSTAT) 0.4 MG SL tablet Place 0.4 mg under the tongue every 5 (five) minutes as needed.    . Pyridoxine HCl (B-6 PO) Take by mouth.    . Turmeric 450 MG CAPS Take 450 mg by mouth daily.    . [DISCONTINUED] isosorbide mononitrate (IMDUR) 60 MG 24 hr tablet Take 1 tablet (60 mg total) by mouth daily. 90 tablet 3   No current facility-administered medications for this visit.    Allergies as of 08/08/2014  . (No Known Allergies)    Family History  Problem Relation Age of Onset  . Melanoma Mother   . Breast cancer Mother 80  . Hypertension Mother   . Melanoma Father   . Other Father     oral  . Osteoporosis Father     hx of hip fracture and died from med problems after  this.from MI  . Coronary artery disease Father 60  . Cancer Father     oral, lived 79 years  . Breast cancer Maternal Aunt     early 7's  . Colon cancer Paternal Aunt   . Cancer Paternal Aunt     ovarian  . Ovarian cancer Paternal Aunt   . Colon cancer Paternal Aunt   . Colon cancer Cousin 68  . Colon cancer Cousin 10    History   Social History  . Marital Status: Married    Spouse Name: N/A    Number of Children: N/A  . Years of Education: N/A   Occupational History  . Not on file.   Social History Main Topics  . Smoking status: Former Smoker -- 1.00 packs/day    Quit date: 03/07/1986  . Smokeless tobacco: Never Used  . Alcohol Use: 2.5 oz/week    5 drink(s) per week     Comment: 5 glasses red wine per week  . Drug Use: No  . Sexual Activity:    Partners: Male    Birth Control/ Protection: Post-menopausal   Other Topics Concern  . 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 female wiGERD without alarm symptoms  I have given her a refill prescription for lansoprazole 30 mg once daily. She has no alarm symptoms. I explained to her that I'm happy to refill this again in one year but if she still needs prescription strength proton pump inhibitor I would like to see her back in the office in 2 years. I did suggest that she try to cut back to the 15 mg over-the-counter strength and if that works for her she could discontinue the OTC strength lansoprazole  instead.

## 2014-08-08 NOTE — Patient Instructions (Addendum)
Refills of lansoprazole given. Return to see Dr. Ardis Hughs in office you still need prescription strength PPI in 2 years.

## 2014-08-29 ENCOUNTER — Ambulatory Visit (INDEPENDENT_AMBULATORY_CARE_PROVIDER_SITE_OTHER): Payer: BLUE CROSS/BLUE SHIELD | Admitting: Internal Medicine

## 2014-08-29 ENCOUNTER — Encounter: Payer: Self-pay | Admitting: Internal Medicine

## 2014-08-29 VITALS — BP 98/68 | Temp 97.9°F | Ht 61.0 in | Wt 162.2 lb

## 2014-08-29 DIAGNOSIS — J988 Other specified respiratory disorders: Secondary | ICD-10-CM

## 2014-08-29 DIAGNOSIS — J029 Acute pharyngitis, unspecified: Secondary | ICD-10-CM

## 2014-08-29 DIAGNOSIS — J22 Unspecified acute lower respiratory infection: Secondary | ICD-10-CM

## 2014-08-29 DIAGNOSIS — Z955 Presence of coronary angioplasty implant and graft: Secondary | ICD-10-CM | POA: Insufficient documentation

## 2014-08-29 NOTE — Progress Notes (Signed)
Pre visit review using our clinic review tool, if applicable. No additional management support is needed unless otherwise documented below in the visit note.  Chief Complaint  Patient presents with  . Sore Throat    Had a cold last week.  Continues to have a sore throat  . Hoarse    HPI:  Margaret Howard  comes in today for SDA for  new problem evaluation.  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  No meds except tylenol   Dry feeling  Tracheal area   No cp sob   ROS: See pertinent positives and negatives per HPI. Cad stabel had cath in fall stent is open feels  Doing well  Husband says she is snoring and has never done this  Past Medical History  Diagnosis Date  . Positive ANA (antinuclear antibody)   . Arthritis     spine, feet, hands, knees  . GERD (gastroesophageal reflux disease)   . Hypertension     has been off med. > 6 mos.  . Fibromyalgia   . Lipoma of thigh     left  . Shortness of breath     with exertion  . CAD (coronary artery disease)     LHC 06/27/11: mLAD 50-60%, ostial septal perf 50%, mRCA occluded with L-R collats, EF 55% with inf HK.  She underwent FFR of he LAD (0.87 after adenosine) and this was not hemodynamically significant  . Fall 10/20/2011  . Heart attack 2012    1 stent    Family History  Problem Relation Age of Onset  . Melanoma Mother   . Breast cancer Mother 48  . Hypertension Mother   . Melanoma Father   . Other Father     oral  . Osteoporosis Father     hx of hip fracture and died from med problems after this.from MI  . Coronary artery disease Father 71  . Cancer Father     oral, lived 29 years  . Breast cancer Maternal Aunt     early 52's  . Colon cancer Paternal Aunt   . Cancer Paternal Aunt     ovarian  . Ovarian cancer Paternal Aunt   . Colon cancer Paternal Aunt   .  Colon cancer Cousin 29  . Colon cancer Cousin 22    History   Social History  . Marital Status: Married    Spouse Name: N/A  . Number of Children: N/A  . Years of Education: N/A   Social History Main Topics  . Smoking status: Former Smoker -- 1.00 packs/day    Quit date: 03/07/1986  . Smokeless tobacco: Never Used  . Alcohol Use: 2.5 oz/week    5 drink(s) per week     Comment: 5 glasses red wine per week  . Drug Use: No  . Sexual Activity:    Partners: Male    Birth Control/ Protection: Post-menopausal   Other Topics Concern  . None   Social History Narrative   Occupation: Consulting civil engineer pre K   HH of 6  Now 2  Children out of home  [place] [employer] [place]   4 dogs, cat, bird, frog and mouse   G3P3   Has a grandchild      [place]    Outpatient Encounter Prescriptions as of  08/29/2014  Medication Sig  . aspirin 81 MG tablet Take 81 mg by mouth daily.  . carvedilol (COREG) 6.25 MG tablet Take by mouth.  Marland Kitchen CINNAMON PO Take by mouth.  . clopidogrel (PLAVIX) 75 MG tablet Take 75 mg by mouth daily. Dr. Myriam Jacobson  . CRESTOR 20 MG tablet TAKE 1 TABELT BY MOUTH AT BEDTIME  . fish oil-omega-3 fatty acids 1000 MG capsule Take 2 g by mouth daily. bid  . Ginger, Zingiber officinalis, (GINGER PO) Take by mouth.    Marland Kitchen glucosamine-chondroitin 500-400 MG tablet Take 1 tablet by mouth 3 (three) times daily.    . lansoprazole (PREVACID) 30 MG capsule Take 1 capsule (30 mg total) by mouth daily.  Marland Kitchen lisinopril (PRINIVIL,ZESTRIL) 10 MG tablet Take 10 mg by mouth daily.  . nitroGLYCERIN (NITROSTAT) 0.4 MG SL tablet Place 0.4 mg under the tongue every 5 (five) minutes as needed.  . Pyridoxine HCl (B-6 PO) Take by mouth.  . Turmeric 450 MG CAPS Take 450 mg by mouth daily.    EXAM:  BP 98/68 mmHg  Temp(Src) 97.9 F (36.6 C) (Oral)  Ht 5\' 1"  (1.549 m)  Wt 162 lb 3.2 oz (73.573 kg)  BMI 30.66 kg/m2  LMP 08/02/2010  Body mass index is 30.66 kg/(m^2).  GENERAL: vitals  reviewed and listed above, alert, oriented, appears well hydrated and in no acute distress minimally hoarse throat clearing obvious mild cough  No dysphagia or stridor  HEENT: atraumatic, conjunctiva  clear, no obvious abnormalities on inspection of external nose and ears congested face non tenders tms clear  OP : no lesion edema or exudate  1 + red no lesion good airway  NECK: no obvious masses on inspection palpation  No sig adenopathy  LUNGS: clear to auscultation bilaterally, no wheezes, rales or rhonchi, good air movement CV: HRRR, no clubbing cyanosis or   nl cap refill  MS: moves all extremities without noticeable focal  abnormality PSYCH: pleasant and cooperative, no obvious depression or anxiety  ASSESSMENT AND PLAN:  Discussed the following assessment and plan:  Sore throat - prov viral involviong trachea sinuses  no alarm features epxectant managment and sx relief .consdier rx for sinusitis if progressive   Acute respiratory infection  S/P coronary artery stent placement  Underlying cad stable  consider empiric rx for sinusitis if not improving by end of week or if  Fever etc  -Patient advised to return or notify health care team  if symptoms worsen ,persist or new concerns arise.  Patient Instructions  This is a viral respiratory infection  And mostly likely  running its course  Chest is clear .  However if getting  persistent nasal congestion and  unrelieved throat consider rx for bacterial sinusitis   infection that is draining .   Contact us    If  njot getting better as expected . Or if fever .      [name]. Zayden Hahne M.D.

## 2014-08-29 NOTE — Patient Instructions (Signed)
This is a viral respiratory infection  And mostly likely  running its course  Chest is clear .  However if getting  persistent nasal congestion and  unrelieved throat consider rx for bacterial sinusitis   infection that is draining .   Contact us    If  njot getting better as expected . Or if fever .

## 2014-09-02 ENCOUNTER — Telehealth: Payer: Self-pay | Admitting: Internal Medicine

## 2014-09-02 MED ORDER — AMOXICILLIN-POT CLAVULANATE 875-125 MG PO TABS: 1.0000 | ORAL_TABLET | Freq: Two times a day (BID) | ORAL | Status: DC

## 2014-09-02 NOTE — Telephone Encounter (Signed)
Per Elim, I advised patient her antibiotics was called into her pharmacy.  (She didn't mention wanting the hycodan.)

## 2014-09-02 NOTE — Telephone Encounter (Signed)
Pt states that she was in on Monday to be seen for a ? Sinus infection and a sore throat. This is her third week of feeling bad and patient states that she is not feeling better and was directed to call this morning if sx had not improved to get a rx. [facility] [address]. She is coughing so much it is making her vomit. She is not running a temp.

## 2014-09-02 NOTE — Telephone Encounter (Signed)
Please document what you told the pt.

## 2014-09-02 NOTE — Telephone Encounter (Signed)
LM of cell for the pt to return my call.

## 2014-09-02 NOTE — Telephone Encounter (Signed)
augmentin 875 1 po bid for 7 days   I will send in rx plase contact her If she wants can give hycodan  180 cc  But would need to pick up the scrip.

## 2014-09-20 ENCOUNTER — Encounter: Payer: Self-pay | Admitting: Internal Medicine

## 2014-09-20 ENCOUNTER — Ambulatory Visit (INDEPENDENT_AMBULATORY_CARE_PROVIDER_SITE_OTHER): Payer: BLUE CROSS/BLUE SHIELD | Admitting: Internal Medicine

## 2014-09-20 VITALS — BP 104/74 | Temp 98.1°F | Ht 61.0 in | Wt 164.7 lb

## 2014-09-20 DIAGNOSIS — Z8739 Personal history of other diseases of the musculoskeletal system and connective tissue: Secondary | ICD-10-CM

## 2014-09-20 DIAGNOSIS — M545 Low back pain: Secondary | ICD-10-CM

## 2014-09-20 DIAGNOSIS — Z955 Presence of coronary angioplasty implant and graft: Secondary | ICD-10-CM

## 2014-09-20 MED ORDER — PREDNISONE 10 MG PO TABS: ORAL_TABLET | ORAL | Status: DC

## 2014-09-20 MED ORDER — CYCLOBENZAPRINE HCL 5 MG PO TABS: 5.0000 mg | ORAL_TABLET | Freq: Three times a day (TID) | ORAL | Status: DC | PRN

## 2014-09-20 NOTE — Progress Notes (Signed)
Pre visit review using our clinic review tool, if applicable. No additional management support is needed unless otherwise documented below in the visit note.  Chief Complaint  Patient presents with  . Lower Back Pain    X2WKS.    HPI: Patient Margaret Howard  comes in today for SDA for  new problem evaluation. 2 week onset of  Left lower back pain  si area and to buttocks     Not better with ice head tylenol raidates to buittocks no weakness fever  uti s.  Not midline  ROS: See pertinent positives and negatives per HPI. No current injury cp sob  neuro sx   No bleeding  Past Medical History  Diagnosis Date  . Positive ANA (antinuclear antibody)   . Arthritis     spine, feet, hands, knees  . GERD (gastroesophageal reflux disease)   . Hypertension     has been off med. > 6 mos.  . Fibromyalgia   . Lipoma of thigh     left  . Shortness of breath     with exertion  . CAD (coronary artery disease)     LHC 06/27/11: mLAD 50-60%, ostial septal perf 50%, mRCA occluded with L-R collats, EF 55% with inf HK.  She underwent FFR of he LAD (0.87 after adenosine) and this was not hemodynamically significant  . Fall 10/20/2011  . Heart attack 2012    1 stent   Past Surgical History  Procedure Laterality Date  . Spine surgery  1995    microdisecetomy and laminectomy L5-S1  . Left foot  06/29/2008    scar tissue left foot  . Lipoma excision  12/13/2008    bilat. upper extremity masses  . Colonoscopy    . Foot surgery      Right, fracture repair  . Anterior cervical discectomy  12/12/2010    and fusion C5-6, C6-7  . Carpal tunnel release  06/29/2008; 08/2009    right; left  . Carotid stent  08/26/11    [facility]  . Ankle fracture surgery Left   . Breast surgery  12/11    MRI guided breast biopsy  . Fractional flow reserve wire N/A 06/27/2011    Procedure: FRACTIONAL FLOW RESERVE WIRE;  Surgeon: Burnell Blanks, MD;  [facility];  Service: Cardiovascular;  Laterality: N/A;     Family History  Problem Relation Age of Onset  . Melanoma Mother   . Breast cancer Mother 74  . Hypertension Mother   . Melanoma Father   . Other Father     oral  . Osteoporosis Father     hx of hip fracture and died from med problems after this.from MI  . Coronary artery disease Father 40  . Cancer Father     oral, lived 94 years  . Breast cancer Maternal Aunt     early 35's  . Colon cancer Paternal Aunt   . Cancer Paternal Aunt     ovarian  . Ovarian cancer Paternal Aunt   . Colon cancer Paternal Aunt   . Colon cancer Cousin 51  . Colon cancer Cousin 80    History   Social History  . Marital Status: Married    Spouse Name: N/A  . Number of Children: N/A  . Years of Education: N/A   Social History Main Topics  . Smoking status: Former Smoker -- 1.00 packs/day    Quit date: 03/07/1986  . Smokeless tobacco: Never Used  . Alcohol Use: 2.5  oz/week    5 drink(s) per week     Comment: 5 glasses red wine per week  . Drug Use: No  . Sexual Activity:    Partners: Male    Birth Control/ Protection: Post-menopausal   Other Topics Concern  . 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 aspirin 81 MG tablet Take 81 mg by mouth daily.  . carvedilol (COREG) 6.25 MG tablet Take 6.25 mg by mouth 2 (two) times daily with a meal.   . CINNAMON PO Take by mouth.  . clopidogrel (PLAVIX) 75 MG tablet Take 75 mg by mouth daily. Dr. Myriam Jacobson  . CRESTOR 20 MG tablet TAKE 1 TABELT BY MOUTH AT BEDTIME  . fish oil-omega-3 fatty acids 1000 MG capsule Take 2 g by mouth daily. bid  . Ginger, Zingiber officinalis, (GINGER PO) Take by mouth.    Marland Kitchen glucosamine-chondroitin 500-400 MG tablet Take 1 tablet by mouth 3 (three) times  daily.    . lansoprazole (PREVACID) 30 MG capsule Take 1 capsule (30 mg total) by mouth daily.  Marland Kitchen lisinopril (PRINIVIL,ZESTRIL) 10 MG tablet Take 10 mg by mouth daily.  . nitroGLYCERIN (NITROSTAT) 0.4 MG SL tablet Place 0.4 mg under the tongue every 5 (five) minutes as needed.  . Pyridoxine HCl (B-6 PO) Take by mouth.  . Turmeric 450 MG CAPS Take 450 mg by mouth daily.  . cyclobenzaprine (FLEXERIL) 5 MG tablet Take 1 tablet (5 mg total) by mouth 3 (three) times daily as needed for muscle spasms.  . predniSONE (DELTASONE) 10 MG tablet Take 6,6,6,4,4,4,2,2,2,1,1,1, tabs per day or as directed .  . [DISCONTINUED] amoxicillin-clavulanate (AUGMENTIN) 875-125 MG per tablet Take 1 tablet by mouth every 12 (twelve) hours.    EXAM:  BP 104/74 mmHg  Temp(Src) 98.1 F (36.7 C) (Oral)  Ht 5\' 1"  (1.549 m)  Wt 164 lb 11.2 oz (74.707 kg)  BMI 31.14 kg/m2  LMP 08/02/2010  Body mass index is 31.14 kg/(m^2).  GENERAL: vitals reviewed and listed above, alert, oriented, appears well hydrated and in no acute distress  Stiff in chair  Gait cautious but  Steady  HEENT: atraumatic, conjunctiva  clear, no obvious abnormalities on inspection of external nose and ears NECK: no obvious masses on inspection palpation   back no midline tenderness   Left si area tenderness   Toe heel ok  MS: moves all extremities  Otherwise without noticeable focal  Abnormality gait antalgic stiff from back  PSYCH: pleasant and cooperative, no obvious depression or anxiety  ASSESSMENT AND PLAN:  Discussed the following assessment and plan:  Left low back pain, with sciatica presence unspecified  S/P coronary artery stent placement  History of degenerative disc disease - cervical lumbar surgery Hx of sib spinal and djd disease but  Not midline  And no alarm features  Cant take nsaid  pred  And flexeril fu dr brookes if not getting better or alarm features  -Patient advised to return or notify health care team  if symptoms  worsen ,persist or new concerns arise.  Patient Instructions  I agree this is back related    Since  cant take nsaid  Prednisone trial with  Night muscle relaxant.   If not improvng then  See Dr. Rolena Infante and team      [name]. Eldena Dede M.D.

## 2014-09-20 NOTE — Patient Instructions (Signed)
I agree this is back related    Since cant take nsaid  Prednisone trial with  Night muscle relaxant.   If not improvng then  See Dr. Rolena Infante and team

## 2014-10-03 ENCOUNTER — Other Ambulatory Visit: Payer: Self-pay

## 2014-10-03 DIAGNOSIS — Z1231 Encounter for screening mammogram for malignant neoplasm of breast: Secondary | ICD-10-CM

## 2014-11-03 ENCOUNTER — Ambulatory Visit
Admission: RE | Admit: 2014-11-03 | Discharge: 2014-11-03 | Disposition: A | Discharge status: Home / Self Care | Payer: BLUE CROSS/BLUE SHIELD | Source: Ambulatory Visit

## 2014-11-03 DIAGNOSIS — Z1231 Encounter for screening mammogram for malignant neoplasm of breast: Secondary | ICD-10-CM

## 2014-11-09 ENCOUNTER — Ambulatory Visit: Payer: BC Managed Care – PPO | Admitting: Obstetrics and Gynecology

## 2014-12-21 ENCOUNTER — Encounter: Payer: Self-pay | Admitting: Internal Medicine

## 2014-12-21 ENCOUNTER — Ambulatory Visit (INDEPENDENT_AMBULATORY_CARE_PROVIDER_SITE_OTHER): Payer: BLUE CROSS/BLUE SHIELD | Admitting: Internal Medicine

## 2014-12-21 VITALS — BP 110/76 | Temp 98.2°F | Ht 61.0 in | Wt 160.0 lb

## 2014-12-21 DIAGNOSIS — I10 Essential (primary) hypertension: Secondary | ICD-10-CM | POA: Diagnosis not present

## 2014-12-21 DIAGNOSIS — R05 Cough: Secondary | ICD-10-CM

## 2014-12-21 DIAGNOSIS — T464X5A Adverse effect of angiotensin-converting-enzyme inhibitors, initial encounter: Secondary | ICD-10-CM

## 2014-12-21 DIAGNOSIS — R053 Chronic cough: Secondary | ICD-10-CM

## 2014-12-21 DIAGNOSIS — I2510 Atherosclerotic heart disease of native coronary artery without angina pectoris: Secondary | ICD-10-CM | POA: Diagnosis not present

## 2014-12-21 MED ORDER — VALSARTAN 80 MG PO TABS: 80.0000 mg | ORAL_TABLET | Freq: Every day | ORAL | Status: DC

## 2014-12-21 NOTE — Progress Notes (Signed)
Pre visit review using our clinic review tool, if applicable. No additional management support is needed unless otherwise documented below in the visit note.  Chief Complaint  Patient presents with  . Cough    X2Months    HPI: Patient Margaret Howard  comes in today for SDA for  new problem evaluation. Onset insidious for a few months noted during school year throat clearing  Eats and swallows  .    No snoring  Someawakening  stridor in night  Takes prevacid for this and antacid.  And has controlled reflux couging  At night sometimes wakes up .  Just got back  From  [place] and  [place].   Using cough drops when  In school hall. . Almost continuously when she was at school. Nice any shortness of breath or wheezing cardiac symptoms. Generally feels well otherwise and denies any allergy symptoms PND congestion. No fever.  ROS: See pertinent positives and negatives per HPI.  Past Medical History  Diagnosis Date  . Positive ANA (antinuclear antibody)   . Arthritis     spine, feet, hands, knees  . GERD (gastroesophageal reflux disease)   . Hypertension     has been off med. > 6 mos.  . Fibromyalgia   . Lipoma of thigh     left  . Shortness of breath     with exertion  . CAD (coronary artery disease)     LHC 06/27/11: mLAD 50-60%, ostial septal perf 50%, mRCA occluded with L-R collats, EF 55% with inf HK.  She underwent FFR of he LAD (0.87 after adenosine) and this was not hemodynamically significant  . Fall 10/20/2011  . Heart attack 2012    1 stent    Family History  Problem Relation Age of Onset  . Melanoma Mother   . Breast cancer Mother 72  . Hypertension Mother   . Melanoma Father   . Other Father     oral  . Osteoporosis Father     hx of hip fracture and died from med problems after this.from MI  . Coronary artery disease Father 36  . Cancer Father     oral, lived 84 years  . Breast cancer Maternal Aunt     early 7's  . Colon cancer Paternal Aunt   . Cancer Paternal  Aunt     ovarian  . Ovarian cancer Paternal Aunt   . Colon cancer Paternal Aunt   . Colon cancer Cousin 63  . Colon cancer Cousin 25    History   Social History  . Marital Status: Married    Spouse Name: N/A  . Number of Children: N/A  . Years of Education: N/A   Social History Main Topics  . Smoking status: Former Smoker -- 1.00 packs/day    Quit date: 03/07/1986  . Smokeless tobacco: Never Used  . Alcohol Use: 2.5 oz/week    5 drink(s) per week     Comment: 5 glasses red wine per week  . Drug Use: No  . Sexual Activity:    Partners: Male    Birth Control/ Protection: Post-menopausal   Other Topics Concern  . 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 aspirin 81 MG tablet Take 81 mg by mouth daily.    . carvedilol (COREG) 6.25 MG tablet Take 6.25 mg by mouth 2 (two) times daily with a meal.     . CINNAMON PO Take by mouth.    . clopidogrel (PLAVIX) 75 MG tablet Take 75 mg by mouth daily. Dr. Myriam Jacobson    . CRESTOR 20 MG tablet TAKE 1 TABELT BY MOUTH AT BEDTIME 90 tablet 3  . cyclobenzaprine (FLEXERIL) 5 MG tablet Take 1 tablet (5 mg total) by mouth 3 (three) times daily as needed for muscle spasms. 30 tablet 1  . fish oil-omega-3 fatty acids 1000 MG capsule Take 2 g by mouth daily. bid    . Ginger, Zingiber officinalis, (GINGER PO) Take by mouth.      Marland Kitchen glucosamine-chondroitin 500-400 MG tablet Take 1 tablet by mouth 3 (three) times daily.      . lansoprazole (PREVACID) 30 MG capsule Take 1 capsule (30 mg total) by mouth daily. 30 capsule 11  . nitroGLYCERIN (NITROSTAT) 0.4 MG SL tablet Place 0.4 mg under the tongue every 5 (five) minutes as needed.    . Pyridoxine HCl (B-6 PO) Take by mouth.    . Turmeric 450 MG  CAPS Take 450 mg by mouth daily.    Marland Kitchen lisinopril (PRINIVIL,ZESTRIL) 10 MG tablet Take 10 mg by mouth daily.    . predniSONE (DELTASONE) 10 MG tablet Take 6,6,6,4,4,4,2,2,2,1,1,1, tabs per day or as directed . 40 tablet 0   No facility-administered medications prior to visit.     EXAM:  BP 110/76 mmHg  Temp(Src) 98.2 F (36.8 C) (Oral)  Ht 5\' 1"  (1.549 m)  Wt 160 lb (72.576 kg)  BMI 30.25 kg/m2  LMP 08/02/2010  Body mass index is 30.25 kg/(m^2).  GENERAL: vitals reviewed and listed above, alert, oriented, appears well hydrated and in no acute distress HEENT: atraumatic, conjunctiva  clear, no obvious abnormalities on inspection of external nose and ears face nontender TMs clear face nontender OP : no lesion edema or exudate  NECK: no obvious masses on inspection palpation  LUNGS: clear to auscultation bilaterally, no wheezes, rales or rhonchi, good air movement no stridor quiet respirations CV: HRRR, no clubbing cyanosis or  peripheral edema nl cap refill  MS: moves all extremities without noticeable focal  abnormality PSYCH: pleasant and cooperative, no obvious depression or anxiety  ASSESSMENT AND PLAN:  Discussed the following assessment and plan:  No diagnosis found. I suspect ACE inhibitor cough based on contacts timing in character. Also would to avoid continuous upper airway irritation from cough medicine. There is possibility reflux could be a part of this but she is on good suppressive and has no other symptoms. Will change to acereceptor blocker and she should notify her cardiologist follow-up in 1-2 months to make sure cough is gone and blood pressure controlled. Or she can notify us on my chart. Review of records she had a chest CT a few years ago for a stable pulmonary nodule if cough persistent we can get a plain x-ray but I doubt intrinsic lung disease as cause. -Patient advised to return or notify health care team  if symptoms worsen ,persist or new concerns  arise.  Patient Instructions  This cough acts like a reaction to the ACE inhibitor lisinopril that you are on. It also can be related to the cough drops you are taking. No cough drops . Can try  sugar-free candy to suck on. It may take a month for the cough to be resolved after changing medication. We will change to nasal receptor blocker which is also heart healthy and helps blood pressure control. We will send a copy of our notes to your cardiologist. Can check with them if they want you to change to something else. If this cough is not improved in a month or 2 we may need more evaluation. Continue your antireflux medication. Sometimes reflux while had to throat clearing and irritable airway cough. Stop the lisinopril        [name]. Josealfredo Adkins M.D.

## 2014-12-21 NOTE — Patient Instructions (Addendum)
This cough acts like a reaction to the ACE inhibitor lisinopril that you are on. It also can be related to the cough drops you are taking. No cough drops . Can try sugar-free candy to suck on. It may take a month for the cough to be resolved after changing medication. We will change to nasal receptor blocker which is also heart healthy and helps blood pressure control. We will send a copy of our notes to your cardiologist. Can check with them if they want you to change to something else. If this cough is not improved in a month or 2 we may need more evaluation. Continue your antireflux medication. Sometimes reflux while had to throat clearing and irritable airway cough. Stop the lisinopril

## 2015-01-24 ENCOUNTER — Other Ambulatory Visit: Payer: Self-pay | Admitting: Internal Medicine

## 2015-01-24 NOTE — Telephone Encounter (Signed)
Denied.  This medication is not filled by Dr. Regis Bill.

## 2015-01-30 ENCOUNTER — Telehealth: Payer: Self-pay | Admitting: *Deleted

## 2015-01-30 MED ORDER — VALSARTAN 80 MG PO TABS: 80.0000 mg | ORAL_TABLET | Freq: Every day | ORAL | Status: DC

## 2015-01-30 NOTE — Telephone Encounter (Signed)
Patient is requesting a 90 day supply ofvalsartan (DIOVAN) 80 MG tablet [facility] [address] [phone]

## 2015-01-31 ENCOUNTER — Telehealth: Payer: Self-pay | Admitting: Internal Medicine

## 2015-01-31 NOTE — Telephone Encounter (Signed)
Left a message for a return call.

## 2015-01-31 NOTE — Telephone Encounter (Signed)
Pt call to let Dr  Regis Bill know that the following medicine is working. Was told by doctor that is med working no need to keep appt.Margaret Howard

## 2015-01-31 NOTE — Telephone Encounter (Signed)
Please record more information  Is bp controlled.?  Is cough totally gone? If so great .! Yearly wellness check ok

## 2015-02-01 NOTE — Telephone Encounter (Signed)
Left a message for a return call.

## 2015-02-02 ENCOUNTER — Ambulatory Visit: Payer: BLUE CROSS/BLUE SHIELD | Admitting: Internal Medicine

## 2015-02-02 NOTE — Telephone Encounter (Signed)
Spoke to the pt.  She stated her bp is now well controlled.  BP is averaging 110/70.  Cough is no longer present.  Doing well and will come back for her yearly physical.

## 2015-02-08 ENCOUNTER — Other Ambulatory Visit: Payer: Self-pay

## 2015-03-24 ENCOUNTER — Ambulatory Visit: Payer: Self-pay | Admitting: Certified Nurse Midwife

## 2015-03-30 ENCOUNTER — Ambulatory Visit (INDEPENDENT_AMBULATORY_CARE_PROVIDER_SITE_OTHER): Payer: BLUE CROSS/BLUE SHIELD | Admitting: Obstetrics and Gynecology

## 2015-03-30 ENCOUNTER — Encounter: Payer: Self-pay | Admitting: Obstetrics and Gynecology

## 2015-03-30 VITALS — BP 118/70 | HR 64 | Resp 15 | Ht 61.0 in | Wt 164.0 lb

## 2015-03-30 DIAGNOSIS — Z124 Encounter for screening for malignant neoplasm of cervix: Secondary | ICD-10-CM | POA: Diagnosis not present

## 2015-03-30 DIAGNOSIS — Z01419 Encounter for gynecological examination (general) (routine) without abnormal findings: Secondary | ICD-10-CM | POA: Diagnosis not present

## 2015-03-30 NOTE — Progress Notes (Signed)
Patient ID: Margaret Howard, female   DOB: 12/16/1965, 49 y.o.   MRN: [id number] 49 y.o. [id number] MarriedCaucasianF here for annual exam.  PMP no bleeding, no significant vasomotor symptoms. Mild vaginal dryness, not needing a lubricant.     Patient's last menstrual period was 08/02/2010.          Sexually active: Yes.    The current method of family planning is post menopausal status.    Exercising: Yes.    walking/ yoga Smoker:  Former smoker  Health Maintenance: Pap:  10-07-11 WNL NEG HR HPV  History of abnormal Pap:  no MMG:  11-03-14 WNL Colonoscopy:  08-01-10 WNL, f/u in 10 years BMD:   08-13-12 osteopenia, f/u with rheumatologist TDaP:  2013 Gardasil: N/A   reports that she quit smoking about 29 years ago. She has never used smokeless tobacco. She reports that she drinks about 3.6 oz of alcohol per week. She reports that she does not use illicit drugs. 3 kids, youngest still in college. Has a 70 year old grandson.   Past Medical History  Diagnosis Date  . Positive ANA (antinuclear antibody)   . Arthritis     spine, feet, hands, knees  . GERD (gastroesophageal reflux disease)   . Hypertension     has been off med. > 6 mos.  . Fibromyalgia   . Lipoma of thigh     left  . Shortness of breath     with exertion  . CAD (coronary artery disease)     LHC 06/27/11: mLAD 50-60%, ostial septal perf 50%, mRCA occluded with L-R collats, EF 55% with inf HK.  She underwent FFR of he LAD (0.87 after adenosine) and this was not hemodynamically significant  . Fall 10/20/2011  . Heart attack 2012    1 stent    Past Surgical History  Procedure Laterality Date  . Spine surgery  1995    microdisecetomy and laminectomy L5-S1  . Left foot  06/29/2008    scar tissue left foot  . Lipoma excision  12/13/2008    bilat. upper extremity masses  . Colonoscopy    . Foot surgery      Right, fracture repair  . Anterior cervical discectomy  12/12/2010    and fusion C5-6, C6-7  . Carpal tunnel release   06/29/2008; 08/2009    right; left  . Carotid stent  08/26/11    [facility]  . Ankle fracture surgery Left   . Breast surgery  12/11    MRI guided breast biopsy  . Fractional flow reserve wire N/A 06/27/2011    Procedure: FRACTIONAL FLOW RESERVE WIRE;  Surgeon: Burnell Blanks, MD;  Location: [facility] CATH LAB;  Service: Cardiovascular;  Laterality: N/A;   Last cath in 10/15, f/u with cardiology this October Current Outpatient Prescriptions  Medication Sig Dispense Refill  . aspirin 81 MG tablet Take 81 mg by mouth daily.    . carvedilol (COREG) 6.25 MG tablet Take 6.25 mg by mouth 2 (two) times daily with a meal.     . CINNAMON PO Take by mouth.    . clopidogrel (PLAVIX) 75 MG tablet Take 75 mg by mouth daily. Dr. Myriam Jacobson    . CRESTOR 20 MG tablet TAKE 1 TABELT BY MOUTH AT BEDTIME 90 tablet 3  . fish oil-omega-3 fatty acids 1000 MG capsule Take 2 g by mouth daily. bid    . Ginger, Zingiber officinalis, (GINGER PO) Take by mouth.      Marland Kitchen  glucosamine-chondroitin 500-400 MG tablet Take 1 tablet by mouth 3 (three) times daily.      . lansoprazole (PREVACID) 30 MG capsule Take 1 capsule (30 mg total) by mouth daily. 30 capsule 11  . nitroGLYCERIN (NITROSTAT) 0.4 MG SL tablet Place 0.4 mg under the tongue every 5 (five) minutes as needed.    . Pyridoxine HCl (B-6 PO) Take by mouth.    . ranitidine (ZANTAC) 150 MG tablet Take 150 mg by mouth at bedtime.  12  . Turmeric 450 MG CAPS Take 450 mg by mouth daily.    . valsartan (DIOVAN) 80 MG tablet Take 1 tablet (80 mg total) by mouth daily. 90 tablet 1  . [DISCONTINUED] isosorbide mononitrate (IMDUR) 60 MG 24 hr tablet Take 1 tablet (60 mg total) by mouth daily. 90 tablet 3   No current facility-administered medications for this visit.    Family History  Problem Relation Age of Onset  . Melanoma Mother   . Breast cancer Mother 7  . Hypertension Mother   . Melanoma Father   . Other Father     oral  . Osteoporosis Father     hx of  hip fracture and died from med problems after this.from MI  . Coronary artery disease Father 46  . Cancer Father     oral, lived 62 years  . Breast cancer Maternal Aunt     early 72's  . Colon cancer Paternal Aunt   . Cancer Paternal Aunt     ovarian  . Ovarian cancer Paternal Aunt   . Colon cancer Paternal Aunt   . Colon cancer Cousin 56  . Colon cancer Cousin 73    Review of Systems  Constitutional: Negative.   HENT: Negative.   Eyes: Negative.   Respiratory: Negative.   Cardiovascular: Negative.   Gastrointestinal: Negative.   Endocrine: Negative.   Genitourinary: Negative.   Musculoskeletal: Negative.   Skin: Negative.   Allergic/Immunologic: Negative.   Neurological: Negative.   Psychiatric/Behavioral: Negative.     Exam:   BP 118/70 mmHg  Pulse 64  Resp 15  Ht 5\' 1"  (1.549 m)  Wt 164 lb (74.39 kg)  BMI 31.00 kg/m2  LMP 08/02/2010  Weight change: @WEIGHTCHANGE @ Height:   Height: 5\' 1"  (154.9 cm)  Ht Readings from Last 3 Encounters:  03/30/15 5\' 1"  (1.549 m)  12/21/14 5\' 1"  (1.549 m)  09/20/14 5\' 1"  (1.549 m)    General appearance: alert, cooperative and appears stated age Head: Normocephalic, without obvious abnormality, atraumatic Neck: no adenopathy, supple, symmetrical, trachea midline and thyroid normal to inspection and palpation Lungs: clear to auscultation bilaterally Breasts: normal appearance, no masses or tenderness Heart: regular rate and rhythm Abdomen: soft, non-tender; bowel sounds normal; no masses,  no organomegaly Extremities: extremities normal, atraumatic, no cyanosis or edema Skin: Skin color, texture, turgor normal. No rashes or lesions Lymph nodes: Cervical, supraclavicular, and axillary nodes normal. No abnormal inguinal nodes palpated Neurologic: Grossly normal   Pelvic: External genitalia:  no lesions              Urethra:  normal appearing urethra with no masses, tenderness or lesions              Bartholins and Skenes:  normal                 Vagina: atrophic               Cervix: no lesions  Bimanual Exam:  Uterus:  normal size, contour, position, consistency, mobility, non-tender              Adnexa: no mass, fullness, tenderness               Rectovaginal: Confirms               Anus:  normal sphincter tone, no lesions  Chaperone was present for exam.  A:  Well Woman with normal exam  Mild vaginal atrophy, use lubrication as needed  P:   Pap with hpv  Mammogram  Colonoscopy UTD  Labs with primary MD  On multivitamin with vit D, discussed calcium intake  Breast self exam discussed

## 2015-03-30 NOTE — Patient Instructions (Signed)

## 2015-04-03 LAB — IPS PAP TEST WITH HPV

## 2015-04-11 ENCOUNTER — Ambulatory Visit (INDEPENDENT_AMBULATORY_CARE_PROVIDER_SITE_OTHER): Payer: BLUE CROSS/BLUE SHIELD | Admitting: Family Medicine

## 2015-04-11 DIAGNOSIS — Z23 Encounter for immunization: Secondary | ICD-10-CM

## 2015-04-18 ENCOUNTER — Encounter: Payer: Self-pay | Admitting: Internal Medicine

## 2015-04-18 ENCOUNTER — Ambulatory Visit (INDEPENDENT_AMBULATORY_CARE_PROVIDER_SITE_OTHER): Payer: BLUE CROSS/BLUE SHIELD | Admitting: Internal Medicine

## 2015-04-18 VITALS — BP 122/86 | Temp 98.1°F | Wt 164.0 lb

## 2015-04-18 DIAGNOSIS — R638 Other symptoms and signs concerning food and fluid intake: Secondary | ICD-10-CM | POA: Diagnosis not present

## 2015-04-18 DIAGNOSIS — K219 Gastro-esophageal reflux disease without esophagitis: Secondary | ICD-10-CM

## 2015-04-18 DIAGNOSIS — E785 Hyperlipidemia, unspecified: Secondary | ICD-10-CM

## 2015-04-18 DIAGNOSIS — J029 Acute pharyngitis, unspecified: Secondary | ICD-10-CM

## 2015-04-18 DIAGNOSIS — I2510 Atherosclerotic heart disease of native coronary artery without angina pectoris: Secondary | ICD-10-CM | POA: Diagnosis not present

## 2015-04-18 MED ORDER — PANTOPRAZOLE SODIUM 40 MG PO TBEC: 40.0000 mg | DELAYED_RELEASE_TABLET | Freq: Every day | ORAL | Status: DC

## 2015-04-18 NOTE — Progress Notes (Signed)
Pre visit review using our clinic review tool, if applicable. No additional management support is needed unless otherwise documented below in the visit note.  Chief Complaint  Patient presents with  . Follow-up    gerd worse      HPI: Patient Margaret Howard  comes in today for SDA for  Ongoing  problem evaluation. Has seen dr   Dr Benjamine Mola dx for st with  Reflux      August and still there and given xantac  And now has heart burn . And now feeling it .   Dr Ardis Hughs not have appt  until dec 16th   Had been on prevacid  For this  Now in past weeks  Inc sx   buntns the most  At night  Evening  Has tried to change diet  No weight gain or loss  Exercises   Ranitidine 150 bid  And then prevacid in the am.   Add on tums   Got worse  In last week.   No vomiting diarrhea  No  Angina  Is on various  supplements for health various docs including [facility] cards   ROS: See pertinent positives and negatives per HPI.cant lose weight due for lipid panel pre card visit this fall   Past Medical History  Diagnosis Date  . Positive ANA (antinuclear antibody)   . Arthritis     spine, feet, hands, knees  . GERD (gastroesophageal reflux disease)   . Hypertension     has been off med. > 6 mos.  . Fibromyalgia   . Lipoma of thigh     left  . Shortness of breath     with exertion  . CAD (coronary artery disease)     LHC 06/27/11: mLAD 50-60%, ostial septal perf 50%, mRCA occluded with L-R collats, EF 55% with inf HK.  She underwent FFR of he LAD (0.87 after adenosine) and this was not hemodynamically significant  . Fall 10/20/2011  . Heart attack [facility]) 2012    1 stent    Family History  Problem Relation Age of Onset  . Melanoma Mother   . Breast cancer Mother 25  . Hypertension Mother   . Melanoma Father   . Other Father     oral  . Osteoporosis Father     hx of hip fracture and died from med problems after this.from MI  . Coronary artery disease Father 51  . Cancer Father     oral, lived 72 years   . Breast cancer Maternal Aunt     early 31's  . Colon cancer Paternal Aunt   . Cancer Paternal Aunt     ovarian  . Ovarian cancer Paternal Aunt   . Colon cancer Paternal Aunt   . Colon cancer Cousin 69  . Colon cancer Cousin 36    Social History   Social History  . Marital Status: Married    Spouse Name: N/A  . Number of Children: N/A  . Years of Education: N/A   Social History Main Topics  . Smoking status: Former Smoker -- 1.00 packs/day    Quit date: 03/07/1986  . Smokeless tobacco: Never Used  . Alcohol Use: 3.6 oz/week    6 Standard drinks or equivalent per week     Comment: 5 glasses red wine per week  . Drug Use: No  . Sexual Activity:    Partners: Male    Birth Control/ Protection: Post-menopausal   Other Topics Concern  . 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 aspirin 81 MG tablet Take 81 mg by mouth daily.    . carvedilol (COREG) 6.25 MG tablet Take 6.25 mg by mouth 2 (two) times daily with a meal.     . CINNAMON PO Take by mouth.    . clopidogrel (PLAVIX) 75 MG tablet Take 75 mg by mouth daily. Dr. Myriam Jacobson    . CRESTOR 20 MG tablet TAKE 1 TABELT BY MOUTH AT BEDTIME 90 tablet 3  . fish oil-omega-3 fatty acids 1000 MG capsule Take 2 g by mouth daily. bid    . Ginger, Zingiber officinalis, (GINGER PO) Take by mouth.      Marland Kitchen glucosamine-chondroitin 500-400 MG tablet Take 1 tablet by mouth 3 (three) times daily.      . lansoprazole (PREVACID) 30 MG capsule Take 1 capsule (30 mg total) by mouth daily. 30 capsule 11  . nitroGLYCERIN (NITROSTAT) 0.4 MG SL tablet Place 0.4 mg under the tongue every 5 (five) minutes as needed.    . Pyridoxine HCl (B-6 PO) Take by mouth.    . ranitidine (ZANTAC) 150 MG  tablet Take 150 mg by mouth at bedtime.  12  . Turmeric 450 MG CAPS Take 450 mg by mouth daily.    . valsartan (DIOVAN) 80 MG tablet Take 1 tablet (80 mg total) by mouth daily. 90 tablet 1   No facility-administered medications prior to visit.     EXAM:  BP 122/86 mmHg  Temp(Src) 98.1 F (36.7 C) (Oral)  Wt 164 lb (74.39 kg)  LMP 08/02/2010  Body mass index is 31 kg/(m^2).  GENERAL: vitals reviewed and listed above, alert, oriented, appears well hydrated and in no acute distress some throat clearing  Nl voice  HEENT: atraumatic, conjunctiva  clear, no obvious abnormalities on inspection of external nose and ears OP : no lesion edema or exudate  NECK: no obvious masses on inspection palpation  LUNGS: clear to auscultation bilaterally, no wheezes, rales or rhonchi, good air movement CV: HRRR, no clubbing cyanosis or  peripheral edema nl cap refill  Abdomen:  Sof,t normal bowel sounds without hepatosplenomegaly, no guarding rebound or masses no CVA tenderness MS: moves all extremities without noticeable focal  abnormality PSYCH: pleasant and cooperative, no obvious depression or anxiety Wt Readings from Last 3 Encounters:  04/18/15 164 lb (74.39 kg)  03/30/15 164 lb (74.39 kg)  12/21/14 160 lb (72.576 kg)   Lab Results  Component Value Date   WBC 5.7 10/28/2013   HGB 14.5 10/28/2013   HCT 42.3 10/28/2013   PLT 263.0 10/28/2013   GLUCOSE 94 10/28/2013   CHOL 101 10/28/2013   TRIG 43.0 10/28/2013   HDL 51.10 10/28/2013   LDLCALC 41 10/28/2013   ALT 29 10/28/2013   AST 31 10/28/2013   NA 139 10/28/2013   K 4.3 10/28/2013   CL 106 10/28/2013   CREATININE 0.8 10/28/2013   BUN 11 10/28/2013   CO2 25 10/28/2013   TSH 1.39 10/28/2013   INR 1.0 06/21/2011     ASSESSMENT AND PLAN:  Discussed the following assessment and plan:  Gastroesophageal reflux disease, esophagitis presence not  specified  Sore throat - Plan: Basic metabolic panel, CBC with Differential/Platelet,  Hemoglobin A1c, Hepatic function panel, Lipid panel, TSH, T4, free  Unable to lose weight - Plan: Basic metabolic panel, CBC with Differential/Platelet, Hemoglobin A1c, Hepatic function panel, Lipid panel, TSH, T4, free  Atherosclerosis of native coronary artery of native heart without angina pectoris - Plan: Basic metabolic panel, CBC with Differential/Platelet, Hemoglobin A1c, Hepatic function panel, Lipid panel, TSH, T4, free  Hyperlipidemia - Plan: Basic metabolic panel, CBC with Differential/Platelet, Hemoglobin A1c, Hepatic function panel, Lipid panel, TSH, T4, free  -Patient advised to return or notify health care team  if symptoms worsen ,persist or new concerns arise.  Patient Instructions  Try stopping the  Vitamins  Supplements  if not absolutely necessary to avoid extra in the stomach .   Change to   protonix  Every day and saty on the ranitidine   Avoid calcium  Antacids can try magnesium ones.   Plan fasting labs to check thyroid cholesterol etc.  . Contact us  If progressing without relief. 5 # weight loss can help.     Food Choices for Gastroesophageal Reflux Disease, Adult When you have gastroesophageal reflux disease (GERD), the foods you eat and your eating habits are very important. Choosing the right foods can help ease the discomfort of GERD. WHAT GENERAL GUIDELINES DO I NEED TO FOLLOW?  Choose fruits, vegetables, whole grains, low-fat dairy products, and low-fat meat, fish, and poultry.  Limit fats such as oils, salad dressings, butter, nuts, and avocado.  Keep a food diary to identify foods that cause symptoms.  Avoid foods that cause reflux. These may be different for different people.  Eat frequent small meals instead of three large meals each day.  Eat your meals slowly, in a relaxed setting.  Limit fried foods.  Cook foods using methods other than frying.  Avoid drinking alcohol.  Avoid drinking large amounts of liquids with your meals.  Avoid  bending over or lying down until 2-3 hours after eating. WHAT FOODS ARE NOT RECOMMENDED? The following are some foods and drinks that may worsen your symptoms: Vegetables Tomatoes. Tomato juice. Tomato and spaghetti sauce. Chili peppers. Onion and garlic. Horseradish. Fruits Oranges, grapefruit, and lemon (fruit and juice). Meats High-fat meats, fish, and poultry. This includes hot dogs, ribs, ham, sausage, salami, and bacon. Dairy Whole milk and chocolate milk. Sour cream. Cream. Butter. Ice cream. Cream cheese.  Beverages Coffee and tea, with or without caffeine. Carbonated beverages or energy drinks. Condiments Hot sauce. Barbecue sauce.  Sweets/Desserts Chocolate and cocoa. Donuts. Peppermint and spearmint. Fats and Oils High-fat foods, including [place] fries and potato chips. Other Vinegar. Strong spices, such as black pepper, white pepper, red pepper, cayenne, curry powder, cloves, ginger, and chili powder. The items listed above may not be a complete list of foods and beverages to avoid. Contact your dietitian for more information.   This information is not intended to replace advice given to you by your health care provider. Make sure you discuss any questions you have with your health care provider.   Document Released: 06/17/2005 Document Revised: 07/08/2014 Document Reviewed: 04/21/2013 Elsevier Interactive Patient Education 2016 [place] K. Cairo Lingenfelter M.D.

## 2015-04-18 NOTE — Patient Instructions (Addendum)
Try stopping the  Vitamins  Supplements  if not absolutely necessary to avoid extra in the stomach .   Change to   protonix  Every day and saty on the ranitidine   Avoid calcium  Antacids can try magnesium ones.   Plan fasting labs to check thyroid cholesterol etc.  . Contact us  If progressing without relief. 5 # weight loss can help.     Food Choices for Gastroesophageal Reflux Disease, Adult When you have gastroesophageal reflux disease (GERD), the foods you eat and your eating habits are very important. Choosing the right foods can help ease the discomfort of GERD. WHAT GENERAL GUIDELINES DO I NEED TO FOLLOW?  Choose fruits, vegetables, whole grains, low-fat dairy products, and low-fat meat, fish, and poultry.  Limit fats such as oils, salad dressings, butter, nuts, and avocado.  Keep a food diary to identify foods that cause symptoms.  Avoid foods that cause reflux. These may be different for different people.  Eat frequent small meals instead of three large meals each day.  Eat your meals slowly, in a relaxed setting.  Limit fried foods.  Cook foods using methods other than frying.  Avoid drinking alcohol.  Avoid drinking large amounts of liquids with your meals.  Avoid bending over or lying down until 2-3 hours after eating. WHAT FOODS ARE NOT RECOMMENDED? The following are some foods and drinks that may worsen your symptoms: Vegetables Tomatoes. Tomato juice. Tomato and spaghetti sauce. Chili peppers. Onion and garlic. Horseradish. Fruits Oranges, grapefruit, and lemon (fruit and juice). Meats High-fat meats, fish, and poultry. This includes hot dogs, ribs, ham, sausage, salami, and bacon. Dairy Whole milk and chocolate milk. Sour cream. Cream. Butter. Ice cream. Cream cheese.  Beverages Coffee and tea, with or without caffeine. Carbonated beverages or energy drinks. Condiments Hot sauce. Barbecue sauce.  Sweets/Desserts Chocolate and cocoa. Donuts.  Peppermint and spearmint. Fats and Oils High-fat foods, including [place] fries and potato chips. Other Vinegar. Strong spices, such as black pepper, white pepper, red pepper, cayenne, curry powder, cloves, ginger, and chili powder. The items listed above may not be a complete list of foods and beverages to avoid. Contact your dietitian for more information.   This information is not intended to replace advice given to you by your health care provider. Make sure you discuss any questions you have with your health care provider.   Document Released: 06/17/2005 Document Revised: 07/08/2014 Document Reviewed: 04/21/2013 Elsevier Interactive Patient Education [employer].

## 2015-04-21 ENCOUNTER — Other Ambulatory Visit (INDEPENDENT_AMBULATORY_CARE_PROVIDER_SITE_OTHER): Payer: BLUE CROSS/BLUE SHIELD

## 2015-04-21 DIAGNOSIS — J029 Acute pharyngitis, unspecified: Secondary | ICD-10-CM | POA: Diagnosis not present

## 2015-04-21 DIAGNOSIS — I2510 Atherosclerotic heart disease of native coronary artery without angina pectoris: Secondary | ICD-10-CM | POA: Diagnosis not present

## 2015-04-21 DIAGNOSIS — R638 Other symptoms and signs concerning food and fluid intake: Secondary | ICD-10-CM

## 2015-04-21 DIAGNOSIS — E785 Hyperlipidemia, unspecified: Secondary | ICD-10-CM

## 2015-04-21 LAB — CBC WITH DIFFERENTIAL/PLATELET
BASOS ABS: 0 10*3/uL (ref 0.0–0.1)
Basophils Relative: 0.5 % (ref 0.0–3.0)
EOS ABS: 0.1 10*3/uL (ref 0.0–0.7)
Eosinophils Relative: 2.1 % (ref 0.0–5.0)
HEMATOCRIT: 40.8 % (ref 36.0–46.0)
HEMOGLOBIN: 13.9 g/dL (ref 12.0–15.0)
Lymphocytes Relative: 27.4 % (ref 12.0–46.0)
Lymphs Abs: 1.4 10*3/uL (ref 0.7–4.0)
MCHC: 34 g/dL (ref 30.0–36.0)
MCV: 95.2 fl (ref 78.0–100.0)
Monocytes Absolute: 0.3 10*3/uL (ref 0.1–1.0)
Monocytes Relative: 6.8 % (ref 3.0–12.0)
Neutro Abs: 3.2 10*3/uL (ref 1.4–7.7)
Neutrophils Relative %: 63.2 % (ref 43.0–77.0)
PLATELETS: 241 10*3/uL (ref 150.0–400.0)
RBC: 4.29 Mil/uL (ref 3.87–5.11)
RDW: 12.5 % (ref 11.5–15.5)
WBC: 5.1 10*3/uL (ref 4.0–10.5)

## 2015-04-21 LAB — HEPATIC FUNCTION PANEL
ALK PHOS: 89 U/L (ref 39–117)
ALT: 24 U/L (ref 0–35)
AST: 22 U/L (ref 0–37)
Albumin: 4.2 g/dL (ref 3.5–5.2)
BILIRUBIN DIRECT: 0.2 mg/dL (ref 0.0–0.3)
BILIRUBIN TOTAL: 0.7 mg/dL (ref 0.2–1.2)
Total Protein: 7.3 g/dL (ref 6.0–8.3)

## 2015-04-21 LAB — HEMOGLOBIN A1C: Hgb A1c MFr Bld: 5 % (ref 4.6–6.5)

## 2015-04-21 LAB — LIPID PANEL
Cholesterol: 116 mg/dL (ref 0–200)
HDL: 59 mg/dL (ref >39.00)
LDL CALC: 49 mg/dL (ref 0–99)
NONHDL: 57.35
Total CHOL/HDL Ratio: 2
Triglycerides: 44 mg/dL (ref 0.0–149.0)
VLDL: 8.8 mg/dL (ref 0.0–40.0)

## 2015-04-21 LAB — BASIC METABOLIC PANEL
BUN: 14 mg/dL (ref 6–23)
CO2: 27 mEq/L (ref 19–32)
CREATININE: 0.79 mg/dL (ref 0.40–1.20)
Calcium: 9.6 mg/dL (ref 8.4–10.5)
Chloride: 105 mEq/L (ref 96–112)
GFR: 81.4 mL/min (ref >60.00)
Glucose, Bld: 102 mg/dL — ABNORMAL HIGH (ref 70–99)
Potassium: 4.6 mEq/L (ref 3.5–5.1)
Sodium: 141 mEq/L (ref 135–145)

## 2015-04-21 LAB — TSH: TSH: 1.99 u[IU]/mL (ref 0.35–4.50)

## 2015-04-21 LAB — T4, FREE: FREE T4: 0.83 ng/dL (ref 0.60–1.60)

## 2015-05-19 ENCOUNTER — Encounter: Payer: Self-pay | Admitting: Internal Medicine

## 2015-05-19 ENCOUNTER — Ambulatory Visit (INDEPENDENT_AMBULATORY_CARE_PROVIDER_SITE_OTHER): Payer: BLUE CROSS/BLUE SHIELD | Admitting: Internal Medicine

## 2015-05-19 VITALS — BP 120/82 | Temp 97.8°F | Wt 161.7 lb

## 2015-05-19 DIAGNOSIS — R1013 Epigastric pain: Secondary | ICD-10-CM | POA: Diagnosis not present

## 2015-05-19 DIAGNOSIS — K219 Gastro-esophageal reflux disease without esophagitis: Secondary | ICD-10-CM | POA: Diagnosis not present

## 2015-05-19 MED ORDER — SUCRALFATE 1 G PO TABS: 1.0000 g | ORAL_TABLET | Freq: Three times a day (TID) | ORAL | Status: DC

## 2015-05-19 NOTE — Patient Instructions (Addendum)
Agree with seeing GI   Increase the ranitidine  To 150 mg twice a day . Pre lunch  And evening .  If that is not  Improving  Then    Also can add  carafate as a trial for  Gi coating   In the interim.   Keep appt  In December but contact if getting  Much worse in the inter.

## 2015-05-19 NOTE — Progress Notes (Signed)
Pre visit review using our clinic review tool, if applicable. No additional management support is needed unless otherwise documented below in the visit note.  Chief Complaint  Patient presents with  . Follow-up    GI sx     HPI: Patient Margaret Howard  comes in today for SDA for  Ongoing  problem evaluation.  Ongoing gerd throat sx  Given protonix last visit in October  Stopped supplements   And  Still  post prandial burning mid line and high epigastric   No vomiting  Gaviscon helped  As needed   No nocturnal  sx   Takes asa  Just before breakfast  No bleeding  , Has appt Dr Ardis Hughs in  December dec 16  No dysphagia  ROS: See pertinent positives and negatives per HPI.  Past Medical History  Diagnosis Date  . Positive ANA (antinuclear antibody)   . Arthritis     spine, feet, hands, knees  . GERD (gastroesophageal reflux disease)   . Hypertension     has been off med. > 6 mos.  . Fibromyalgia   . Lipoma of thigh     left  . Shortness of breath     with exertion  . CAD (coronary artery disease)     LHC 06/27/11: mLAD 50-60%, ostial septal perf 50%, mRCA occluded with L-R collats, EF 55% with inf HK.  She underwent FFR of he LAD (0.87 after adenosine) and this was not hemodynamically significant  . Fall 10/20/2011  . Heart attack [facility]) 2012    1 stent    Family History  Problem Relation Age of Onset  . Melanoma Mother   . Breast cancer Mother 15  . Hypertension Mother   . Melanoma Father   . Other Father     oral  . Osteoporosis Father     hx of hip fracture and died from med problems after this.from MI  . Coronary artery disease Father 44  . Cancer Father     oral, lived 59 years  . Breast cancer Maternal Aunt     early 12's  . Colon cancer Paternal Aunt   . Cancer Paternal Aunt     ovarian  . Ovarian cancer Paternal Aunt   . Colon cancer Paternal Aunt   . Colon cancer Cousin 45  . Colon cancer Cousin 41    Social History   Social History  . Marital Status:  Married    Spouse Name: N/A  . Number of Children: N/A  . Years of Education: N/A   Social History Main Topics  . Smoking status: Former Smoker -- 1.00 packs/day    Quit date: 03/07/1986  . Smokeless tobacco: Never Used  . Alcohol Use: 3.6 oz/week    6 Standard drinks or equivalent per week     Comment: 5 glasses red wine per week  . Drug Use: No  . Sexual Activity:    Partners: Male    Birth Control/ Protection: Post-menopausal   Other Topics Concern  . 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 aspirin 81 MG tablet Take 81 mg by mouth daily.    Marland Kitchen  clopidogrel (PLAVIX) 75 MG tablet Take 75 mg by mouth daily. Dr. Myriam Jacobson    . CRESTOR 20 MG tablet TAKE 1 TABELT BY MOUTH AT BEDTIME 90 tablet 3  . nitroGLYCERIN (NITROSTAT) 0.4 MG SL tablet Place 0.4 mg under the tongue every 5 (five) minutes as needed.    . pantoprazole (PROTONIX) 40 MG tablet Take 1 tablet (40 mg total) by mouth daily. 30 tablet 3  . Pyridoxine HCl (B-6 PO) Take by mouth.    . valsartan (DIOVAN) 80 MG tablet Take 1 tablet (80 mg total) by mouth daily. 90 tablet 1  . carvedilol (COREG) 6.25 MG tablet Take 6.25 mg by mouth 2 (two) times daily with a meal.     . CINNAMON PO Take by mouth.    . fish oil-omega-3 fatty acids 1000 MG capsule Take 2 g by mouth daily. bid    . Ginger, Zingiber officinalis, (GINGER PO) Take by mouth.      Marland Kitchen glucosamine-chondroitin 500-400 MG tablet Take 1 tablet by mouth 3 (three) times daily.      . ranitidine (ZANTAC) 150 MG tablet Take 150 mg by mouth at bedtime.  12  . Turmeric 450 MG CAPS Take 450 mg by mouth daily.     No facility-administered medications prior to visit.     EXAM:  BP 120/82 mmHg  Temp(Src) 97.8 F (36.6  C) (Oral)  Wt 161 lb 11.2 oz (73.347 kg)  LMP 08/02/2010  Body mass index is 30.57 kg/(m^2).  GENERAL: vitals reviewed and listed above, alert, oriented, appears well hydrated and in no acute distress HEENT: atraumatic, conjunctiva  clear, no obvious abnormalities on inspection of external nose and ears OP : no lesion edema or exudate  PSYCH: pleasant and cooperative, no obvious depression or anxiety Wt Readings from Last 3 Encounters:  05/19/15 161 lb 11.2 oz (73.347 kg)  04/18/15 164 lb (74.39 kg)  03/30/15 164 lb (74.39 kg)    ASSESSMENT AND PLAN:  Discussed the following assessment and plan:  Gastroesophageal reflux disease, esophagitis presence not specified  Epigastric burning sensation   Post prandial  Mid day most problematic .    Nocturnal controlled  Add bid zantac  ifnot  helpful empiric  carafate  Keep appt  Dr j let us know  If worse or not helping alarm sx  -Patient advised to return or notify health care team  if symptoms worsen ,persist or new concerns arise.  Patient Instructions  Agree with seeing GI   Increase the ranitidine  To 150 mg twice a day . Pre lunch  And evening .  If that is not  Improving  Then    Also can add  carafate as a trial for  Gi coating   In the interim.   Keep appt  In December but contact if getting  Much worse in the inter.        [name]. Panosh M.D.

## 2015-06-07 ENCOUNTER — Other Ambulatory Visit: Payer: Self-pay | Admitting: Family Medicine

## 2015-06-08 NOTE — Telephone Encounter (Signed)
Request denied.  Filled for four months on 04/18/15

## 2015-06-16 ENCOUNTER — Encounter: Payer: Self-pay | Admitting: Gastroenterology

## 2015-06-16 ENCOUNTER — Ambulatory Visit (INDEPENDENT_AMBULATORY_CARE_PROVIDER_SITE_OTHER): Payer: BLUE CROSS/BLUE SHIELD | Admitting: Gastroenterology

## 2015-06-16 VITALS — BP 110/70 | HR 80 | Ht 61.0 in | Wt 161.2 lb

## 2015-06-16 DIAGNOSIS — K219 Gastro-esophageal reflux disease without esophagitis: Secondary | ICD-10-CM

## 2015-06-16 MED ORDER — PANTOPRAZOLE SODIUM 40 MG PO TBEC: 40.0000 mg | DELAYED_RELEASE_TABLET | Freq: Two times a day (BID) | ORAL | Status: DC

## 2015-06-16 NOTE — Patient Instructions (Addendum)
Double your protonix, new script written. Stay on ranitidine at bedtime nightly. Call in 2 months to report on your response.

## 2015-06-16 NOTE — Progress Notes (Signed)
Review of gastrointestinal problems:  1. full sensation in her rectum, 2012. colonoscopy February 2012 was normal to the terminal ileum. symptoms started at the same time as her menstrual periods stopped 2. Chest pains: 2012 chest pains. Went to ER EKG and CRX were normal. The pain went down left arm, she went to ER, admitted for overnight evaluation, cardiac stress test (negative). Then started vomiting. She had ultrasound 2012 was told it was normal., EGD March 2012 showed mild gastritis, biopsies negative for H. Pylori. Suspected non-gastrointestinal chest pains, however HIDA was ordered, she never had it done. Symptoms completely resolved after she increased PPI to twice daily in 2012. 3. GERD symptoms without "alarm features" 08/2014, well controlled on PPI  HPI: This is a   Very pleasant 49 year old woman whom I last saw 8 or 9 months ago  Chief complaint is  Burning in throat  Last seen 9 months ago.    In August had ENT type evaluation, was told acid was burning her throat.  PPI increased and then repeat scope  And she was told that she still had serious acid damage in her throat  Currently she is on protonix 40mg  once daily, ranitidine 150mg  at bedtime nightly.  The protonix is 20-30 min before BF.  Still has pyrosis after meals.  Stopped caffinated bev.  No dysphagia.     Past Medical History  Diagnosis Date  . Positive ANA (antinuclear antibody)   . Arthritis     spine, feet, hands, knees  . GERD (gastroesophageal reflux disease)   . Hypertension     has been off med. > 6 mos.  . Fibromyalgia   . Lipoma of thigh     left  . Shortness of breath     with exertion  . CAD (coronary artery disease)     LHC 06/27/11: mLAD 50-60%, ostial septal perf 50%, mRCA occluded with L-R collats, EF 55% with inf HK.  She underwent FFR of he LAD (0.87 after adenosine) and this was not hemodynamically significant  . Fall 10/20/2011  . Heart attack [facility]) 2012    1 stent    Past  Surgical History  Procedure Laterality Date  . Spine surgery  1995    microdisecetomy and laminectomy L5-S1  . Left foot  06/29/2008    scar tissue left foot  . Lipoma excision  12/13/2008    bilat. upper extremity masses  . Colonoscopy    . Foot surgery      Right, fracture repair  . Anterior cervical discectomy  12/12/2010    and fusion C5-6, C6-7  . Carpal tunnel release  06/29/2008; 08/2009    right; left  . Carotid stent  08/26/11    [facility]  . Ankle fracture surgery Left   . Breast surgery  12/11    MRI guided breast biopsy  . Fractional flow reserve wire N/A 06/27/2011    Procedure: FRACTIONAL FLOW RESERVE WIRE;  Surgeon: Burnell Blanks, MD;  Location: [facility] CATH LAB;  Service: Cardiovascular;  Laterality: N/A;    Current Outpatient Prescriptions  Medication Sig Dispense Refill  . aspirin 81 MG tablet Take 81 mg by mouth daily.    . clopidogrel (PLAVIX) 75 MG tablet Take 75 mg by mouth daily. Dr. Myriam Jacobson    . CRESTOR 20 MG tablet TAKE 1 TABELT BY MOUTH AT BEDTIME 90 tablet 3  . nitroGLYCERIN (NITROSTAT) 0.4 MG SL tablet Place 0.4 mg under the tongue every 5 (five) minutes as needed.    Marland Kitchen  pantoprazole (PROTONIX) 40 MG tablet Take 1 tablet (40 mg total) by mouth daily. 30 tablet 3  . ranitidine (ZANTAC) 150 MG tablet Take 150 mg by mouth at bedtime.  12  . valsartan (DIOVAN) 80 MG tablet Take 1 tablet (80 mg total) by mouth daily. 90 tablet 1  . carvedilol (COREG) 6.25 MG tablet Take 6.25 mg by mouth 2 (two) times daily with a meal.     . [DISCONTINUED] isosorbide mononitrate (IMDUR) 60 MG 24 hr tablet Take 1 tablet (60 mg total) by mouth daily. 90 tablet 3   No current facility-administered medications for this visit.    Allergies as of 06/16/2015  . (No Known Allergies)    Family History  Problem Relation Age of Onset  . Melanoma Mother   . Breast cancer Mother 44  . Hypertension Mother   . Melanoma Father   . Other Father     oral  . Osteoporosis  Father     hx of hip fracture and died from med problems after this.from MI  . Coronary artery disease Father 23  . Cancer Father     oral, lived 82 years  . Breast cancer Maternal Aunt     early 72's  . Colon cancer Paternal Aunt   . Cancer Paternal Aunt     ovarian  . Ovarian cancer Paternal Aunt   . Colon cancer Paternal Aunt   . Colon cancer Cousin 26  . Colon cancer Cousin 24    Social History   Social History  . Marital Status: Married    Spouse Name: N/A  . Number of Children: N/A  . Years of Education: N/A   Occupational History  . Not on file.   Social History Main Topics  . Smoking status: Former Smoker -- 1.00 packs/day    Quit date: 03/07/1986  . Smokeless tobacco: Never Used  . Alcohol Use: 3.6 oz/week    6 Standard drinks or equivalent per week     Comment: 5 glasses red wine per week  . Drug Use: No  . Sexual Activity:    Partners: Male    Birth Control/ Protection: Post-menopausal   Other Topics Concern  . Not on file   Social History Narrative   Occupation: Consulting civil engineer pre K   HH of 6  Now 2  Children out of home  [place] [employer] [place]   4 dogs, cat, bird, frog and mouse   G3P3   Has a grandchild      [place]     Physical Exam: BP 110/70 mmHg  Pulse 80  Ht 5\' 1"  (1.549 m)  Wt 161 lb 3.2 oz (73.12 kg)  BMI 30.47 kg/m2  LMP 08/02/2010 Constitutional: generally well-appearing Psychiatric: alert and oriented x3 Abdomen: soft, nontender, nondistended, no obvious ascites, no peritoneal signs, normal bowel sounds   Assessment and plan: 49 y.o. female with  Burning in throat , question acid damage   the burning in her throat might be acid related. She was told by an ear nose and throat physician that she had serious acid damage. Currently she is only on proton pump inhibitor once daily. She is going to double that so she takes her Protonix 40 mg before breakfast and dinner meal. She will continue her ranitidine 150 mg at bedtime  every night. This is really maximum acid suppression medically. She will call to report on her response in 2 months. If she is improved clinically and I would likely start backing  down, probably with the H2 blocker first. If she is not improved then I think proceeding with EGD and possible pH testing at the same time , will have to decide at that time whether it should be done on or off medicines.   Owens Loffler, MD [facility] 06/16/2015, 3:40 PM

## 2015-07-20 ENCOUNTER — Telehealth: Payer: Self-pay | Admitting: Gastroenterology

## 2015-07-20 NOTE — Telephone Encounter (Signed)
No answer and no voice mail.

## 2015-07-21 NOTE — Telephone Encounter (Signed)
Patient returned phone call. Best # [phone]

## 2015-07-21 NOTE — Telephone Encounter (Signed)
Dr Ardis Hughs per your last office note you stated your would think about EGD and PH testing if pt did not improve.  She has continued to have dysphagia.  Do you want me to set her up directly or have her seen in the office?

## 2015-07-25 ENCOUNTER — Other Ambulatory Visit: Payer: Self-pay

## 2015-07-25 DIAGNOSIS — K219 Gastro-esophageal reflux disease without esophagitis: Secondary | ICD-10-CM

## 2015-07-25 NOTE — Telephone Encounter (Signed)
No answer and no voice mail.

## 2015-07-25 NOTE — Telephone Encounter (Signed)
Pt has been scheduled for 08/10/15, Dr Ardis Hughs the pt is on plavix.  Does she need to hold?  Dr Aline Brochure at [facility] is the prescriber.

## 2015-07-25 NOTE — Telephone Encounter (Signed)
She needs EGD with bravo probe placement at same time.  She should stay ON medicines before and during the test (protonix 40mg  before BF and dinner meals and ranitidine 150mg  at bedtime).  Next available time at [facility] with MAC.  Thanks

## 2015-07-25 NOTE — Telephone Encounter (Signed)
No need for her to come off plavix

## 2015-07-26 NOTE — Telephone Encounter (Signed)
Pt has been notified to stay ON plavix

## 2015-07-31 ENCOUNTER — Encounter (HOSPITAL_COMMUNITY): Payer: Self-pay | Admitting: *Deleted

## 2015-08-01 ENCOUNTER — Other Ambulatory Visit: Payer: Self-pay | Admitting: Internal Medicine

## 2015-08-01 NOTE — Telephone Encounter (Signed)
Sent to the pharmacy by e-scribe. 

## 2015-08-10 ENCOUNTER — Ambulatory Visit (HOSPITAL_COMMUNITY): Payer: BLUE CROSS/BLUE SHIELD | Admitting: Registered Nurse

## 2015-08-10 ENCOUNTER — Ambulatory Visit (HOSPITAL_COMMUNITY)
Admission: RE | Admit: 2015-08-10 | Discharge: 2015-08-10 | Disposition: A | Discharge status: Home / Self Care | Payer: BLUE CROSS/BLUE SHIELD | Source: Ambulatory Visit | Attending: Gastroenterology | Admitting: Gastroenterology

## 2015-08-10 ENCOUNTER — Encounter (HOSPITAL_COMMUNITY)
Admission: RE | Disposition: A | Discharge status: Home / Self Care | Payer: Self-pay | Source: Ambulatory Visit | Attending: Gastroenterology

## 2015-08-10 ENCOUNTER — Encounter (HOSPITAL_COMMUNITY): Payer: Self-pay | Admitting: Registered Nurse

## 2015-08-10 DIAGNOSIS — I252 Old myocardial infarction: Secondary | ICD-10-CM | POA: Diagnosis not present

## 2015-08-10 DIAGNOSIS — R070 Pain in throat: Secondary | ICD-10-CM | POA: Diagnosis not present

## 2015-08-10 DIAGNOSIS — I2510 Atherosclerotic heart disease of native coronary artery without angina pectoris: Secondary | ICD-10-CM | POA: Diagnosis not present

## 2015-08-10 DIAGNOSIS — Z955 Presence of coronary angioplasty implant and graft: Secondary | ICD-10-CM | POA: Insufficient documentation

## 2015-08-10 DIAGNOSIS — M797 Fibromyalgia: Secondary | ICD-10-CM | POA: Diagnosis not present

## 2015-08-10 DIAGNOSIS — M159 Polyosteoarthritis, unspecified: Secondary | ICD-10-CM | POA: Insufficient documentation

## 2015-08-10 DIAGNOSIS — Z87891 Personal history of nicotine dependence: Secondary | ICD-10-CM | POA: Diagnosis not present

## 2015-08-10 DIAGNOSIS — I10 Essential (primary) hypertension: Secondary | ICD-10-CM | POA: Insufficient documentation

## 2015-08-10 DIAGNOSIS — Z79899 Other long term (current) drug therapy: Secondary | ICD-10-CM | POA: Diagnosis not present

## 2015-08-10 DIAGNOSIS — K219 Gastro-esophageal reflux disease without esophagitis: Secondary | ICD-10-CM | POA: Diagnosis not present

## 2015-08-10 HISTORY — PX: ESOPHAGOGASTRODUODENOSCOPY (EGD) WITH PROPOFOL: SHX5813

## 2015-08-10 SURGERY — ESOPHAGOGASTRODUODENOSCOPY (EGD) WITH PROPOFOL
Anesthesia: Monitor Anesthesia Care

## 2015-08-10 MED ORDER — PROPOFOL 10 MG/ML IV BOLUS
INTRAVENOUS | Status: AC
  Filled 2015-08-10: qty 40

## 2015-08-10 MED ORDER — LIDOCAINE HCL (CARDIAC) 20 MG/ML IV SOLN
INTRAVENOUS | Status: AC
  Filled 2015-08-10: qty 5

## 2015-08-10 MED ORDER — SODIUM CHLORIDE 0.9 % IV SOLN: INTRAVENOUS | Status: DC

## 2015-08-10 MED ORDER — LACTATED RINGERS IV SOLN
INTRAVENOUS | Status: DC
  Administered 2015-08-10: 1000 mL via INTRAVENOUS

## 2015-08-10 MED ORDER — LIDOCAINE HCL (CARDIAC) 20 MG/ML IV SOLN
INTRAVENOUS | Status: DC | PRN
  Administered 2015-08-10: 100 mg via INTRAVENOUS

## 2015-08-10 MED ORDER — PROPOFOL 500 MG/50ML IV EMUL
INTRAVENOUS | Status: DC | PRN
  Administered 2015-08-10: 130 ug/kg/min via INTRAVENOUS

## 2015-08-10 MED ORDER — PROPOFOL 10 MG/ML IV BOLUS
INTRAVENOUS | Status: DC | PRN
  Administered 2015-08-10: 40 mg via INTRAVENOUS
  Administered 2015-08-10: 30 mg via INTRAVENOUS
  Administered 2015-08-10: 20 mg via INTRAVENOUS

## 2015-08-10 MED ORDER — PROPOFOL 10 MG/ML IV BOLUS
INTRAVENOUS | Status: AC
  Filled 2015-08-10: qty 20

## 2015-08-10 MED ORDER — FENTANYL CITRATE (PF) 100 MCG/2ML IJ SOLN
25.0000 ug | INTRAMUSCULAR | Status: DC | PRN
Start: 2015-08-10 — End: 2015-08-10

## 2015-08-10 SURGICAL SUPPLY — 14 items

## 2015-08-10 NOTE — Anesthesia Preprocedure Evaluation (Addendum)
Anesthesia Evaluation  Patient identified by MRN, date of birth, ID band Patient awake    Reviewed: Allergy & Precautions, H&P , NPO status , Patient's Chart, lab work & pertinent test results, reviewed documented beta blocker date and time   Airway Mallampati: II  TM Distance: >3 FB Neck ROM: full    Dental no notable dental hx. (+) Dental Advisory Given, Teeth Intact   Pulmonary neg pulmonary ROS, former smoker,    Pulmonary exam normal breath sounds clear to auscultation       Cardiovascular Exercise Tolerance: Good hypertension, Pt. on home beta blockers and Pt. on medications + CAD, + Past MI and + Cardiac Stents  Normal cardiovascular exam Rhythm:regular Rate:Normal     Neuro/Psych negative neurological ROS  negative psych ROS   GI/Hepatic negative GI ROS, Neg liver ROS, GERD  Medicated and Controlled,  Endo/Other  negative endocrine ROS  Renal/GU negative Renal ROS  negative genitourinary   Musculoskeletal   Abdominal   Peds  Hematology negative hematology ROS (+)   Anesthesia Other Findings   Reproductive/Obstetrics negative OB ROS                            Anesthesia Physical Anesthesia Plan  ASA: III  Anesthesia Plan: MAC   Post-op Pain Management:    Induction:   Airway Management Planned: Nasal Cannula and Natural Airway  Additional Equipment:   Intra-op Plan:   Post-operative Plan:   Informed Consent: I have reviewed the patients History and Physical, chart, labs and discussed the procedure including the risks, benefits and alternatives for the proposed anesthesia with the patient or authorized representative who has indicated his/her understanding and acceptance.   Dental Advisory Given  Plan Discussed with: CRNA  Anesthesia Plan Comments:        Anesthesia Quick Evaluation

## 2015-08-10 NOTE — Op Note (Signed)
[facility] [place], [address]   ENDOSCOPY PROCEDURE REPORT  PATIENT: Margaret, Howard  MR#: [id number] BIRTHDATE: [birth year] , 49  yrs. old GENDER: female ENDOSCOPIST: Milus Banister, MD PROCEDURE DATE:  08/10/2015 PROCEDURE:  EGD w/ Bravo capsule placement ASA CLASS:     Class II INDICATIONS:  GERD; globus type discomfort despite PPI BID and H2 blocker at bedtime. MEDICATIONS: Monitored anesthesia care TOPICAL ANESTHETIC: none  DESCRIPTION OF PROCEDURE: After the risks benefits and alternatives of the procedure were thoroughly explained, informed consent was obtained.  The Pentax Gastroscope [id number] endoscope was introduced through the mouth and advanced to the second portion of the duodenum , Without limitations.  The instrument was slowly withdrawn as the mucosa was fully examined.  UGI tract was normal appearing.  The GE junction was normal, located at 35cm from the incisors.  The Bravo probe was placed in the usual fashion at 29cm from the incisors and it's position was confirmed endoscopically following placement.  Retroflexion was not performed.     The scope was then withdrawn from the patient and the procedure completed. COMPLICATIONS: There were no immediate complications.  ENDOSCOPIC IMPRESSION: UGI tract was normal appearing.  The GE junction was normal, located at 35cm from the incisors.  The Bravo probe was placed in the usual fashion at 29cm from the incisors and it's position was confirmed endoscopically following placement  RECOMMENDATIONS: Continue pH test over the next 2 days while continuing to take PPI twice daily and H2 blocker at bedtime.  eSigned:  Milus Banister, MD 08/10/2015 12:42 PM

## 2015-08-10 NOTE — Transfer of Care (Signed)
Immediate Anesthesia Transfer of Care Note  Patient: Margaret Howard  Procedure(s) Performed: Procedure(s) with comments: ESOPHAGOGASTRODUODENOSCOPY (EGD) WITH PROPOFOL (N/A) - WITH BRAVO PROBE ON MEDICINE  Patient Location: PACU and Endoscopy Unit  Anesthesia Type:MAC  Level of Consciousness: awake, alert , oriented and patient cooperative  Airway & Oxygen Therapy: Patient Spontanous Breathing and Patient connected to nasal cannula oxygen  Post-op Assessment: Report given to RN, Post -op Vital signs reviewed and stable and Patient moving all extremities  Post vital signs: Reviewed and stable  Last Vitals:  Filed Vitals:   08/10/15 1036  BP: 128/82  Pulse: 70  Temp: 36.7 C  Resp: 10    Complications: No apparent anesthesia complications

## 2015-08-10 NOTE — Discharge Instructions (Signed)

## 2015-08-10 NOTE — Anesthesia Postprocedure Evaluation (Signed)
Anesthesia Post Note  Patient: Margaret Howard  Procedure(s) Performed: Procedure(s) (LRB): ESOPHAGOGASTRODUODENOSCOPY (EGD) WITH PROPOFOL (N/A)  Patient location during evaluation: PACU Anesthesia Type: MAC Level of consciousness: awake and alert Pain management: pain level controlled Vital Signs Assessment: post-procedure vital signs reviewed and stable Respiratory status: spontaneous breathing, nonlabored ventilation, respiratory function stable and patient connected to nasal cannula oxygen Cardiovascular status: blood pressure returned to baseline and stable Postop Assessment: no signs of nausea or vomiting Anesthetic complications: no    Last Vitals:  Filed Vitals:   08/10/15 1243 08/10/15 1250  BP: 114/70 114/70  Pulse: 70 69  Temp: 36.4 C   Resp: 13 13    Last Pain: There were no vitals filed for this visit.               Dianah Pruett L

## 2015-08-10 NOTE — H&P (Signed)
HPI: This is a woman with GERD,  Chief complaint is globus type sensation despite maximum acid suppression therapy   Past Medical History  Diagnosis Date  . Positive ANA (antinuclear antibody)   . Arthritis     spine, feet, hands, knees  . GERD (gastroesophageal reflux disease)   . Hypertension     has been off med. > 6 mos.  . Fibromyalgia   . Lipoma of thigh     left  . CAD (coronary artery disease)     LHC 06/27/11: mLAD 50-60%, ostial septal perf 50%, mRCA occluded with L-R collats, EF 55% with inf HK.  She underwent FFR of he LAD (0.87 after adenosine) and this was not hemodynamically significant  . Fall 10/20/2011  . Heart attack [facility]) 2012    1 stent    Past Surgical History  Procedure Laterality Date  . Spine surgery  1995    microdisecetomy and laminectomy L5-S1  . Left foot  06/29/2008    scar tissue left foot  . Lipoma excision  12/13/2008    bilat. upper extremity masses  . Colonoscopy    . Foot surgery      Right, fracture repair  . Anterior cervical discectomy  12/12/2010    and fusion C5-6, C6-7  . Carpal tunnel release  06/29/2008; 08/2009    right; left  . Carotid stent  08/26/11    [facility]  . Ankle fracture surgery Left   . Breast surgery  12/11    MRI guided breast biopsy  . Fractional flow reserve wire N/A 06/27/2011    Procedure: FRACTIONAL FLOW RESERVE WIRE;  Surgeon: Burnell Blanks, MD;  Location: [facility] CATH LAB;  Service: Cardiovascular;  Laterality: N/A;    Current Facility-Administered Medications  Medication Dose Route Frequency Provider Last Rate Last Dose  . fentaNYL (SUBLIMAZE) injection 25-50 mcg  25-50 mcg Intravenous Q5 min PRN Rod Mae, MD      . lactated ringers infusion   Intravenous Continuous Rod Mae, MD 125 mL/hr at 08/10/15 1045 1,000 mL at 08/10/15 1045    Allergies as of 07/25/2015  . (No Known Allergies)    Family History  Problem Relation Age of Onset  . Melanoma Mother   . Breast cancer Mother 61  .  Hypertension Mother   . Melanoma Father   . Other Father     oral  . Osteoporosis Father     hx of hip fracture and died from med problems after this.from MI  . Coronary artery disease Father 38  . Cancer Father     oral, lived 77 years  . Breast cancer Maternal Aunt     early 74's  . Colon cancer Paternal Aunt   . Cancer Paternal Aunt     ovarian  . Ovarian cancer Paternal Aunt   . Colon cancer Paternal Aunt   . Colon cancer Cousin 85  . Colon cancer Cousin 88    Social History   Social History  . Marital Status: Married    Spouse Name: N/A  . Number of Children: 3  . Years of Education: N/A   Occupational History  . pre-school teacher    Social History Main Topics  . Smoking status: Former Smoker -- 1.00 packs/day    Quit date: 03/07/1986  . Smokeless tobacco: Never Used  . Alcohol Use: No  . Drug Use: No  . Sexual Activity:    Partners: Male    Birth Control/ Protection: Post-menopausal   Other Topics  Concern  . 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 female with GERD, globus  For EGD evaluation of the esophagus, throat and bravo pH placement   Owens Loffler, MD [facility] 08/10/2015, 11:54 AM

## 2015-08-11 ENCOUNTER — Encounter (HOSPITAL_COMMUNITY): Payer: Self-pay | Admitting: Gastroenterology

## 2015-08-24 ENCOUNTER — Telehealth: Payer: Self-pay | Admitting: Gastroenterology

## 2015-08-24 NOTE — Telephone Encounter (Signed)
Dr Ardis Hughs have you seen the pH bravo results for this pt?

## 2015-08-25 NOTE — Telephone Encounter (Signed)
Left message on machine to call back  

## 2015-08-25 NOTE — Telephone Encounter (Signed)
Pt has been notified of the test results, she will follow up with ENT.  She will continue H2 blocker at night and PPI once daily

## 2015-08-25 NOTE — Telephone Encounter (Signed)
Please call her and apologized for delay in getting back to her about her pH test results.  Her 48 hour bravo wireless pH testing shows that she has no pathological amounts of acid washing up on her esophagus. Her DeMeester score day 1 was 3.2, her DeMeester score days to was 3.9. Normal for these values is below 14.7.     Please let her know it is very unlikely given these results that acid is causing her sore throat, globus sensation. Also given these results I think it is safe that she cut back on her proton pump inhibitor to once daily dosing instead of 2. I would like her to stay on the H2 blocker at bedtime every night. She should follow up with her ear nose and throat physician about the globus sensation and sore throat.

## 2015-09-05 ENCOUNTER — Encounter: Payer: Self-pay | Admitting: Gastroenterology

## 2015-10-16 ENCOUNTER — Other Ambulatory Visit: Payer: Self-pay

## 2015-10-16 DIAGNOSIS — Z1231 Encounter for screening mammogram for malignant neoplasm of breast: Secondary | ICD-10-CM

## 2015-11-06 ENCOUNTER — Ambulatory Visit
Admission: RE | Admit: 2015-11-06 | Discharge: 2015-11-06 | Disposition: A | Discharge status: Home / Self Care | Payer: BLUE CROSS/BLUE SHIELD | Source: Ambulatory Visit

## 2015-11-06 DIAGNOSIS — Z1231 Encounter for screening mammogram for malignant neoplasm of breast: Secondary | ICD-10-CM

## 2015-12-16 ENCOUNTER — Other Ambulatory Visit: Payer: Self-pay | Admitting: Gastroenterology

## 2015-12-16 ENCOUNTER — Other Ambulatory Visit: Payer: Self-pay | Admitting: Internal Medicine

## 2015-12-19 NOTE — Telephone Encounter (Signed)
Due for labs monitoring and ov in October   Can do this as Ov or cpx   Labs  Can refill med through [employer]   Lab Results  Component Value Date   WBC 5.1 04/21/2015   HGB 13.9 04/21/2015   HCT 40.8 04/21/2015   PLT 241.0 04/21/2015   GLUCOSE 102* 04/21/2015   CHOL 116 04/21/2015   TRIG 44.0 04/21/2015   HDL 59.00 04/21/2015   LDLCALC 49 04/21/2015   ALT 24 04/21/2015   AST 22 04/21/2015   NA 141 04/21/2015   K 4.6 04/21/2015   CL 105 04/21/2015   CREATININE 0.79 04/21/2015   BUN 14 04/21/2015   CO2 27 04/21/2015   TSH 1.99 04/21/2015   INR 1.0 06/21/2011   HGBA1C 5.0 04/21/2015

## 2015-12-20 ENCOUNTER — Other Ambulatory Visit: Payer: Self-pay | Admitting: Family Medicine

## 2015-12-20 ENCOUNTER — Telehealth: Payer: Self-pay | Admitting: Family Medicine

## 2015-12-20 DIAGNOSIS — Z0000 Encounter for general adult medical examination without abnormal findings: Secondary | ICD-10-CM

## 2015-12-20 NOTE — Telephone Encounter (Signed)
Pt due for cpx and lab work in Oct.  I have placed the lab orders.  Please help her to make both appointments.  Thanks!!

## 2015-12-20 NOTE — Telephone Encounter (Signed)
Pt has been sch

## 2015-12-20 NOTE — Telephone Encounter (Signed)
Sent to the pharmacy by e-scribe.  Message sent to scheduling. 

## 2016-04-01 ENCOUNTER — Ambulatory Visit: Payer: BLUE CROSS/BLUE SHIELD | Admitting: Obstetrics and Gynecology

## 2016-04-05 ENCOUNTER — Other Ambulatory Visit (INDEPENDENT_AMBULATORY_CARE_PROVIDER_SITE_OTHER): Payer: BLUE CROSS/BLUE SHIELD

## 2016-04-05 DIAGNOSIS — Z0000 Encounter for general adult medical examination without abnormal findings: Secondary | ICD-10-CM

## 2016-04-05 LAB — HEPATIC FUNCTION PANEL
ALBUMIN: 3.9 g/dL (ref 3.5–5.2)
ALT: 22 U/L (ref 0–35)
AST: 19 U/L (ref 0–37)
Alkaline Phosphatase: 92 U/L (ref 39–117)
Bilirubin, Direct: 0.1 mg/dL (ref 0.0–0.3)
TOTAL PROTEIN: 6.8 g/dL (ref 6.0–8.3)
Total Bilirubin: 0.7 mg/dL (ref 0.2–1.2)

## 2016-04-05 LAB — TSH: TSH: 2.37 u[IU]/mL (ref 0.35–4.50)

## 2016-04-05 LAB — LIPID PANEL
CHOL/HDL RATIO: 2
CHOLESTEROL: 111 mg/dL (ref 0–200)
HDL: 57.6 mg/dL (ref >39.00)
LDL CALC: 44 mg/dL (ref 0–99)
NonHDL: 53.5
TRIGLYCERIDES: 47 mg/dL (ref 0.0–149.0)
VLDL: 9.4 mg/dL (ref 0.0–40.0)

## 2016-04-05 LAB — BASIC METABOLIC PANEL
BUN: 13 mg/dL (ref 6–23)
CHLORIDE: 108 meq/L (ref 96–112)
CO2: 26 meq/L (ref 19–32)
CREATININE: 0.77 mg/dL (ref 0.40–1.20)
Calcium: 9.3 mg/dL (ref 8.4–10.5)
GFR: 83.53 mL/min (ref >60.00)
Glucose, Bld: 104 mg/dL — ABNORMAL HIGH (ref 70–99)
Potassium: 3.9 mEq/L (ref 3.5–5.1)
Sodium: 142 mEq/L (ref 135–145)

## 2016-04-05 LAB — CBC WITH DIFFERENTIAL/PLATELET
BASOS ABS: 0 10*3/uL (ref 0.0–0.1)
BASOS PCT: 0.5 % (ref 0.0–3.0)
EOS ABS: 0.1 10*3/uL (ref 0.0–0.7)
Eosinophils Relative: 2.5 % (ref 0.0–5.0)
HEMATOCRIT: 38.8 % (ref 36.0–46.0)
HEMOGLOBIN: 13.7 g/dL (ref 12.0–15.0)
LYMPHS PCT: 29.1 % (ref 12.0–46.0)
Lymphs Abs: 1.4 10*3/uL (ref 0.7–4.0)
MCHC: 35.3 g/dL (ref 30.0–36.0)
MCV: 92.5 fl (ref 78.0–100.0)
MONO ABS: 0.3 10*3/uL (ref 0.1–1.0)
Monocytes Relative: 7 % (ref 3.0–12.0)
NEUTROS ABS: 2.9 10*3/uL (ref 1.4–7.7)
Neutrophils Relative %: 60.9 % (ref 43.0–77.0)
Platelets: 219 10*3/uL (ref 150.0–400.0)
RBC: 4.2 Mil/uL (ref 3.87–5.11)
RDW: 12.6 % (ref 11.5–15.5)
WBC: 4.8 10*3/uL (ref 4.0–10.5)

## 2016-04-17 ENCOUNTER — Encounter: Payer: Self-pay | Admitting: Internal Medicine

## 2016-04-17 ENCOUNTER — Ambulatory Visit (INDEPENDENT_AMBULATORY_CARE_PROVIDER_SITE_OTHER): Payer: BLUE CROSS/BLUE SHIELD | Admitting: Internal Medicine

## 2016-04-17 VITALS — BP 108/72 | Temp 98.1°F | Ht 61.0 in | Wt 168.3 lb

## 2016-04-17 DIAGNOSIS — Z23 Encounter for immunization: Secondary | ICD-10-CM

## 2016-04-17 DIAGNOSIS — I10 Essential (primary) hypertension: Secondary | ICD-10-CM

## 2016-04-17 DIAGNOSIS — Z955 Presence of coronary angioplasty implant and graft: Secondary | ICD-10-CM | POA: Diagnosis not present

## 2016-04-17 DIAGNOSIS — Z0000 Encounter for general adult medical examination without abnormal findings: Secondary | ICD-10-CM | POA: Diagnosis not present

## 2016-04-17 DIAGNOSIS — I2510 Atherosclerotic heart disease of native coronary artery without angina pectoris: Secondary | ICD-10-CM

## 2016-04-17 NOTE — Patient Instructions (Addendum)
Glad you are doing well.  Continue  Avoiding simple carbs .   cpx with labs in a year or as needed.  The elbow could be overuse syndrome  Medial epicondylitis  Such as "golfers elbow"  Avoid repetitive motion   Use cold at end of day.    Medial Epicondylitis With Rehab Medial epicondylitis involves inflammation and pain around the inner (medial) portion of the elbow. This pain is caused by inflammation of the tendons in the forearm that flex (bring down) the wrist. Medial epicondylitis is also called golfer's elbow, because it is common among golfers. However, it may occur in any individual who flexes the wrist regularly. If medial epicondylitis is left untreated, it may become a chronic problem. SYMPTOMS   Pain, tenderness, or inflammation over the inner (medial) side of the elbow.  Pain or weakness with gripping activities.  Pain that increases with wrist twisting motions (using a screwdriver, playing golf, bowling). CAUSES  Medial epicondylitis is caused by inflammation of the tendons that flex the wrist. Causes of injury may include:  Chronic, repetitive stress and strain to the tendons that run from the wrist and forearm to the elbow.  Sudden strain on the forearm, including wrist snap when serving balls with racquet sports, or throwing a baseball. RISK INCREASES WITH:  Sports or occupations that require repetitive and/or strenuous forearm and wrist movements (pitching a baseball, golfing, carpentry).  Poor wrist and forearm strength and flexibility.  Failure to warm up properly before activity.  Resuming activity before healing, rehabilitation, and conditioning are complete. PREVENTION   Warm up and stretch properly before activity.  Maintain physical fitness:  Strength, flexibility, and endurance.  Cardiovascular fitness.  Wear and use properly fitted equipment.  Learn and use proper technique and have a coach correct improper technique.  Wear a tennis elbow  (counterforce) brace. PROGNOSIS  The course of this condition depends on the degree of the injury. If treated properly, acute cases (symptoms lasting less than 4 weeks) are often resolved in 2 to 6 weeks. Chronic (longer lasting cases) often resolve in 3 to 6 months, but may require physical therapy. RELATED COMPLICATIONS   Frequently recurring symptoms, resulting in a chronic problem. Properly treating the problem the first time decreases frequency of recurrence.  Chronic inflammation, scarring, and partial tendon tear, requiring surgery.  Delayed healing or resolution of symptoms. TREATMENT  Treatment first involves the use of ice and medicine, to reduce pain and inflammation. Strengthening and stretching exercises may reduce discomfort, if performed regularly. These exercises may be performed at home, if the condition is an acute injury. Chronic cases may require a referral to a physical therapist for evaluation and treatment. Your caregiver may advise a corticosteroid injection to help reduce inflammation. Rarely, surgery is needed. MEDICATION  If pain medicine is needed, nonsteroidal anti-inflammatory medicines (aspirin and ibuprofen), or other minor pain relievers (acetaminophen), are often advised.  Do not take pain medicine for 7 days before surgery.  Prescription pain relievers may be given, if your caregiver thinks they are needed. Use only as directed and only as much as you need.  Corticosteroid injections may be recommended. These injections should be reserved only for the most severe cases, because they can only be given a certain number of times. HEAT AND COLD  Cold treatment (icing) should be applied for 10 to 15 minutes every 2 to 3 hours for inflammation and pain, and immediately after activity that aggravates your symptoms. Use ice packs or an ice massage.  Heat treatment may be used before performing stretching and strengthening activities prescribed by your caregiver,  physical therapist, or athletic trainer. Use a heat pack or a warm water soak. SEEK MEDICAL CARE IF: Symptoms get worse or do not improve in 2 weeks, despite treatment. EXERCISES  RANGE OF MOTION (ROM) AND STRETCHING EXERCISES - Epicondylitis, Medial (Golfer's Elbow) These exercises may help you when beginning to rehabilitate your injury. Your symptoms may go away with or without further involvement from your physician, physical therapist or athletic trainer. While completing these exercises, remember:   Restoring tissue flexibility helps normal motion to return to the joints. This allows healthier, less painful movement and activity.  An effective stretch should be held for at least 30 seconds.  A stretch should never be painful. You should only feel a gentle lengthening or release in the stretched tissue. RANGE OF MOTION - Wrist Flexion, Active-Assisted  Extend your right / left elbow with your fingers pointing down.*  Gently pull the back of your hand towards you, until you feel a gentle stretch on the top of your forearm.  Hold this position for __________ seconds. Repeat __________ times. Complete this exercise __________ times per day.  *If directed by your physician, physical therapist or athletic trainer, complete this stretch with your elbow bent, rather than extended. RANGE OF MOTION - Wrist Extension, Active-Assisted  Extend your right / left elbow and turn your palm upwards.*  Gently pull your palm and fingertips back, so your wrist extends and your fingers point more toward the ground.  You should feel a gentle stretch on the inside of your forearm.  Hold this position for __________ seconds. Repeat __________ times. Complete this exercise __________ times per day. *If directed by your physician, physical therapist or athletic trainer, complete this stretch with your elbow bent, rather than extended. STRETCH - Wrist Extension   Place your right / left fingertips on a  tabletop leaving your elbow slightly bent. Your fingers should point backwards.  Gently press your fingers and palm down onto the table, by straightening your elbow. You should feel a stretch on the inside of your forearm.  Hold this position for __________ seconds. Repeat __________ times. Complete this stretch __________ times per day.  STRENGTHENING EXERCISES - Epicondylitis, Medial (Golfer's Elbow) These exercises may help you when beginning to rehabilitate your injury. They may resolve your symptoms with or without further involvement from your physician, physical therapist or athletic trainer. While completing these exercises, remember:   Muscles can gain both the endurance and the strength needed for everyday activities through controlled exercises.  Complete these exercises as instructed by your physician, physical therapist or athletic trainer. Increase the resistance and repetitions only as guided.  You may experience muscle soreness or fatigue, but the pain or discomfort you are trying to eliminate should never worsen during these exercises. If this pain does get worse, stop and make sure you are following the directions exactly. If the pain is still present after adjustments, discontinue the exercise until you can discuss the trouble with your caregiver. STRENGTH - Wrist Flexors  Sit with your right / left forearm palm-up, and fully supported on a table or countertop. Your elbow should be resting below the height of your shoulder. Allow your wrist to extend over the edge of the surface.  Loosely holding a __________ weight, or a piece of rubber exercise band or tubing, slowly curl your hand up toward your forearm.  Hold this position for __________ seconds. Slowly  lower the wrist back to the starting position in a controlled manner. Repeat __________ times. Complete this exercise __________ times per day.  STRENGTH - Wrist Extensors  Sit with your right / left forearm palm-down  and fully supported. Your elbow should be resting below the height of your shoulder. Allow your wrist to extend over the edge of the surface.  Loosely holding a __________ weight, or a piece of rubber exercise band or tubing, slowly curl your hand up toward your forearm.  Hold this position for __________ seconds. Slowly lower the wrist back to the starting position in a controlled manner. Repeat __________ times. Complete this exercise __________ times per day.  STRENGTH - Ulnar Deviators  Stand with a ____________________ weight in your right / left hand, or sit while holding a rubber exercise band or tubing, with your healthy arm supported on a table or countertop.  Move your wrist so that your pinkie travels toward your forearm and your thumb moves away from your forearm.  Hold this position for __________ seconds and then slowly lower the wrist back to the starting position. Repeat __________ times. Complete this exercise __________ times per day STRENGTH - Grip   Grasp a tennis ball, a dense sponge, or a large, rolled sock in your hand.  Squeeze as hard as you can, without increasing any pain.  Hold this position for __________ seconds. Release your grip slowly. Repeat __________ times. Complete this exercise __________ times per day.  STRENGTH - Forearm Supinators   Sit with your right / left forearm supported on a table, keeping your elbow below shoulder height. Rest your hand over the edge, palm down.  Gently grip a hammer or a soup ladle.  Without moving your elbow, slowly turn your palm and hand upward to a "thumbs-up" position.  Hold this position for __________ seconds. Slowly return to the starting position. Repeat __________ times. Complete this exercise __________ times per day.  STRENGTH - Forearm Pronators  Sit with your right / left forearm supported on a table, keeping your elbow below shoulder height. Rest your hand over the edge, palm up.  Gently grip a  hammer or a soup ladle.  Without moving your elbow, slowly turn your palm and hand upward to a "thumbs-up" position.  Hold this position for __________ seconds. Slowly return to the starting position. Repeat __________ times. Complete this exercise __________ times per day.    This information is not intended to replace advice given to you by your health care provider. Make sure you discuss any questions you have with your health care provider.   Document Released: 06/17/2005 Document Revised: 07/08/2014 Document Reviewed: 09/29/2008 [employer] Interactive Patient Education [employer].

## 2016-04-17 NOTE — Progress Notes (Signed)
Pre visit review using our clinic review tool, if applicable. No additional management support is needed unless otherwise documented below in the visit note.  Chief Complaint  Patient presents with  . Annual Exam    HPI: Patient  Margaret Howard  50 y.o. comes in today for [place] visit She has stable coronary artery disease multiple orthopedic procedures related to fractures and degenerative disease but today is doing quite well. Exercises regularly just having a hard time losing weight has made dietary changes but feels well. To go to cardiology tomorrow at [facility]. Had some swallowing difficulties which was related to reflux plus having surgical cage around her C-spine. Has gone off supplements and has careful diet controlled. On ppi not taking vit d  Health Maintenance  Topic Date Due  . Hepatitis C Screening  04/16/2017 (Originally 02-10-1966)  . HIV Screening  04/16/2017 (Originally 11/01/1980)  . MAMMOGRAM  11/05/2016  . PAP SMEAR  03/29/2018  . COLONOSCOPY  08/01/2020  . TETANUS/TDAP  12/30/2022  . INFLUENZA VACCINE  Completed   Health Maintenance Review LIFESTYLE:  Exercise:   Exercise  Tobacco/ETS: no Alcohol:  Red wine  Sugar beverages: no Sleep:   Good sleep  Drug use: no HH of   4 Work: 24 hours     ROS:  Left ankle   Medial elbow noted  Tender   Did lots of fishing this summer .  [place]   GEN/ HEENT: No fever, significant weight changes sweats headaches vision problems hearing changes, CV/ PULM; No chest pain shortness of breath cough, syncope,edema  change in exercise tolerance. GI /GU: No adominal pain, vomiting, change in bowel habits. No blood in the stool. No significant GU symptoms. SKIN/HEME: ,no acute skin rashes suspicious lesions or bleeding. No lymphadenopathy, nodules, masses.  NEURO/ PSYCH:  No neurologic signs such as weakness numbness. No depression anxiety. IMM/ Allergy: No unusual infections.  Allergy .   REST of 12  system review negative except as per HPI   Past Medical History:  Diagnosis Date  . Arthritis    spine, feet, hands, knees  . CAD (coronary artery disease)    LHC 06/27/11: mLAD 50-60%, ostial septal perf 50%, mRCA occluded with L-R collats, EF 55% with inf HK.  She underwent FFR of he LAD (0.87 after adenosine) and this was not hemodynamically significant  . Fall 10/20/2011  . Fibromyalgia   . GERD (gastroesophageal reflux disease)   . Heart attack 2012   1 stent  . Hypertension    has been off med. > 6 mos.  . Lipoma of thigh    left  . Positive ANA (antinuclear antibody)     Past Surgical History:  Procedure Laterality Date  . ANKLE FRACTURE SURGERY Left   . ANTERIOR CERVICAL DISCECTOMY  12/12/2010   and fusion C5-6, C6-7  . BREAST SURGERY  12/11   MRI guided breast biopsy  . CAROTID STENT  08/26/11   [facility]  . CARPAL TUNNEL RELEASE  06/29/2008; 08/2009   right; left  . COLONOSCOPY    . ESOPHAGOGASTRODUODENOSCOPY (EGD) WITH PROPOFOL N/A 08/10/2015   Procedure: ESOPHAGOGASTRODUODENOSCOPY (EGD) WITH PROPOFOL;  Surgeon: Milus Banister, MD;  Location: [facility];  Service: Endoscopy;  Laterality: N/A;  WITH BRAVO PROBE ON MEDICINE  . FOOT SURGERY     Right, fracture repair  . FRACTIONAL FLOW RESERVE WIRE N/A 06/27/2011   Procedure: FRACTIONAL FLOW RESERVE WIRE;  Surgeon: Burnell Blanks, MD;  Location: [facility]  CATH LAB;  Service: Cardiovascular;  Laterality: N/A;  . left foot  06/29/2008   scar tissue left foot  . LIPOMA EXCISION  12/13/2008   bilat. upper extremity masses  . [place]   microdisecetomy and laminectomy L5-S1    Family History  Problem Relation Age of Onset  . Melanoma Mother   . Breast cancer Mother 85  . Hypertension Mother   . Melanoma Father   . Other Father     oral  . Osteoporosis Father     hx of hip fracture and died from med problems after this.from MI  . Coronary artery disease Father 27  . Cancer Father     oral, lived 67  years  . Breast cancer Maternal Aunt     early 64's  . Colon cancer Paternal Aunt   . Cancer Paternal Aunt     ovarian  . Ovarian cancer Paternal Aunt   . Colon cancer Paternal Aunt   . Colon cancer Cousin 72  . Colon cancer Cousin 40    Social History   Social History  . Marital status: Married    Spouse name: N/A  . Number of children: 3  . Years of education: N/A   Occupational History  . pre-school teacher    Social History Main Topics  . Smoking status: Former Smoker    Packs/day: 1.00    Quit date: 03/07/1986  . Smokeless tobacco: Never Used  . Alcohol use No  . Drug use: No  . Sexual activity: Yes    Partners: Male    Birth control/ protection: Post-menopausal   Other Topics Concern  . None   Social History Narrative   Occupation: Consulting civil engineer pre K   HH of 6  Now 2  Children out of home  [place] [employer] park   4 dogs, cat, bird, frog and mouse   G3P3   Has a grandchild      [place]    Outpatient Medications Prior to Visit  Medication Sig Dispense Refill  . aspirin 81 MG tablet Take 81 mg by mouth daily.    . carvedilol (COREG) 6.25 MG tablet Take 6.25 mg by mouth 2 (two) times daily with a meal.    . clopidogrel (PLAVIX) 75 MG tablet Take 75 mg by mouth daily. Dr. Myriam Jacobson    . CRESTOR 20 MG tablet TAKE 1 TABELT BY MOUTH AT BEDTIME 90 tablet 3  . ketotifen (ALAWAY) 0.025 % ophthalmic solution Place 1 drop into both eyes 2 (two) times daily as needed (DRY EYES).    . nitroGLYCERIN (NITROSTAT) 0.4 MG SL tablet Place 0.4 mg under the tongue every 5 (five) minutes as needed for chest pain.     . pantoprazole (PROTONIX) 40 MG tablet TAKE 1 TABLET (40 MG TOTAL) BY MOUTH 2 (TWO) TIMES DAILY BEFORE A MEAL. 60 tablet 5  . ranitidine (ZANTAC) 150 MG tablet Take 150 mg by mouth at bedtime.  12  . valsartan (DIOVAN) 80 MG tablet TAKE 1 TABLET BY MOUTH ONCE DAILY 90 tablet 0   No facility-administered medications prior to visit.       EXAM:  BP 108/72 (BP Location: Right Arm, Patient Position: Sitting, Cuff Size: Normal)   Temp 98.1 F (36.7 C) (Oral)   Ht 5\' 1"  (1.549 m)   Wt 168 lb 4.8 oz (76.3 kg)   LMP 08/02/2010   BMI 31.80 kg/m   Body mass index is 31.8 kg/m.  Physical  Exam: Vital signs reviewed [id number] is a well-developed well-nourished alert cooperative    who appearsr stated age in no acute distress.  HEENT: normocephalic atraumatic , Eyes: PERRL EOM's full, conjunctiva clear, Nares: paten,t no deformity discharge or tenderness., Ears: no deformity EAC's clear TMs with normal landmarks. Mouth: clear OP, no lesions, edema.  Moist mucous membranes. Dentition in adequate repair. NECK: supple without masses, thyromegaly or bruits. CHEST/PULM:  Clear to auscultation and percussion breath sounds equal no wheeze , rales or rhonchi. No chest wall deformities or tenderness. CV: PMI is nondisplaced, S1 S2 no gallops, murmurs, rubs. Peripheral pulses are full without delay.No JVD .  ABDOMEN: Bowel sounds normal nontender  No guard or rebound, no hepato splenomegal no CVA tenderness.  No hernia. Extremtities:  No clubbing cyanosis or edema, no acute joint swelling or redness no focal atrophy left ankle mild swelling lat mall  right elbow nl rom tender at medial epicondyl NEURO:  Oriented x3, cranial nerves 3-12 appear to be intact, no obvious focal weakness,gait within normal limits no abnormal reflexes or asymmetrical SKIN: No acute rashes normal turgor, color, no bruising or petechiae. PSYCH: Oriented, good eye contact, no obvious depression anxiety, cognition and judgment appear normal. LN: no cervical axillary inguinal adenopathy  Lab Results  Component Value Date   WBC 4.8 04/05/2016   HGB 13.7 04/05/2016   HCT 38.8 04/05/2016   PLT 219.0 04/05/2016   GLUCOSE 104 (H) 04/05/2016   CHOL 111 04/05/2016   TRIG 47.0 04/05/2016   HDL 57.60 04/05/2016   LDLCALC 44 04/05/2016   ALT 22 04/05/2016   AST 19  04/05/2016   NA 142 04/05/2016   K 3.9 04/05/2016   CL 108 04/05/2016   CREATININE 0.77 04/05/2016   BUN 13 04/05/2016   CO2 26 04/05/2016   TSH 2.37 04/05/2016   INR 1.0 06/21/2011   HGBA1C 5.0 04/21/2015    ASSESSMENT AND PLAN:  Discussed the following assessment and plan:  Visit for preventive health examination  S/P coronary artery stent placement  Essential hypertension  Need for prophylactic vaccination and inoculation against influenza - Plan: Flu Vaccine QUAD 36+ mos PF IM (Fluarix & Fluzone Quad PF)  Atherosclerosis of native coronary artery of native heart without angina pectoris Blood sugar borderline lifestyle intervention continue. Be aware of bone health issues   Patient Care Team: Burnis Medin, MD as PCP - General Milus Banister, MD (Gastroenterology) Susa Day, MD (Orthopedic Surgery) Melina Schools, MD (Orthopedic Surgery) Minus Breeding, MD (Cardiology) Myriam Jacobson as Consulting Physician (Cardiology) Bo Merino, MD (Rheumatology) Emi Holes, MD as Referring Physician (Cardiothoracic Surgery) Leta Baptist, MD as Consulting Physician (Otolaryngology) Patient Instructions  Glad you are doing well.  Continue  Avoiding simple carbs .   cpx with labs in a year or as needed.  The elbow could be overuse syndrome  Medial epicondylitis  Such as "golfers elbow"  Avoid repetitive motion   Use cold at end of day.    Medial Epicondylitis With Rehab Medial epicondylitis involves inflammation and pain around the inner (medial) portion of the elbow. This pain is caused by inflammation of the tendons in the forearm that flex (bring down) the wrist. Medial epicondylitis is also called golfer's elbow, because it is common among golfers. However, it may occur in any individual who flexes the wrist regularly. If medial epicondylitis is left untreated, it may become a chronic problem. SYMPTOMS   Pain, tenderness, or inflammation over the inner (medial)  side  of the elbow.  Pain or weakness with gripping activities.  Pain that increases with wrist twisting motions (using a screwdriver, playing golf, bowling). CAUSES  Medial epicondylitis is caused by inflammation of the tendons that flex the wrist. Causes of injury may include:  Chronic, repetitive stress and strain to the tendons that run from the wrist and forearm to the elbow.  Sudden strain on the forearm, including wrist snap when serving balls with racquet sports, or throwing a baseball. RISK INCREASES WITH:  Sports or occupations that require repetitive and/or strenuous forearm and wrist movements (pitching a baseball, golfing, carpentry).  Poor wrist and forearm strength and flexibility.  Failure to warm up properly before activity.  Resuming activity before healing, rehabilitation, and conditioning are complete. PREVENTION   Warm up and stretch properly before activity.  Maintain physical fitness:  Strength, flexibility, and endurance.  Cardiovascular fitness.  Wear and use properly fitted equipment.  Learn and use proper technique and have a coach correct improper technique.  Wear a tennis elbow (counterforce) brace. PROGNOSIS  The course of this condition depends on the degree of the injury. If treated properly, acute cases (symptoms lasting less than 4 weeks) are often resolved in 2 to 6 weeks. Chronic (longer lasting cases) often resolve in 3 to 6 months, but may require physical therapy. RELATED COMPLICATIONS   Frequently recurring symptoms, resulting in a chronic problem. Properly treating the problem the first time decreases frequency of recurrence.  Chronic inflammation, scarring, and partial tendon tear, requiring surgery.  Delayed healing or resolution of symptoms. TREATMENT  Treatment first involves the use of ice and medicine, to reduce pain and inflammation. Strengthening and stretching exercises may reduce discomfort, if performed regularly. These  exercises may be performed at home, if the condition is an acute injury. Chronic cases may require a referral to a physical therapist for evaluation and treatment. Your caregiver may advise a corticosteroid injection to help reduce inflammation. Rarely, surgery is needed. MEDICATION  If pain medicine is needed, nonsteroidal anti-inflammatory medicines (aspirin and ibuprofen), or other minor pain relievers (acetaminophen), are often advised.  Do not take pain medicine for 7 days before surgery.  Prescription pain relievers may be given, if your caregiver thinks they are needed. Use only as directed and only as much as you need.  Corticosteroid injections may be recommended. These injections should be reserved only for the most severe cases, because they can only be given a certain number of times. HEAT AND COLD  Cold treatment (icing) should be applied for 10 to 15 minutes every 2 to 3 hours for inflammation and pain, and immediately after activity that aggravates your symptoms. Use ice packs or an ice massage.  Heat treatment may be used before performing stretching and strengthening activities prescribed by your caregiver, physical therapist, or athletic trainer. Use a heat pack or a warm water soak. SEEK MEDICAL CARE IF: Symptoms get worse or do not improve in 2 weeks, despite treatment. EXERCISES  RANGE OF MOTION (ROM) AND STRETCHING EXERCISES - Epicondylitis, Medial (Golfer's Elbow) These exercises may help you when beginning to rehabilitate your injury. Your symptoms may go away with or without further involvement from your physician, physical therapist or athletic trainer. While completing these exercises, remember:   Restoring tissue flexibility helps normal motion to return to the joints. This allows healthier, less painful movement and activity.  An effective stretch should be held for at least 30 seconds.  A stretch should never be painful. You should only feel  a gentle lengthening  or release in the stretched tissue. RANGE OF MOTION - Wrist Flexion, Active-Assisted  Extend your right / left elbow with your fingers pointing down.*  Gently pull the back of your hand towards you, until you feel a gentle stretch on the top of your forearm.  Hold this position for __________ seconds. Repeat __________ times. Complete this exercise __________ times per day.  *If directed by your physician, physical therapist or athletic trainer, complete this stretch with your elbow bent, rather than extended. RANGE OF MOTION - Wrist Extension, Active-Assisted  Extend your right / left elbow and turn your palm upwards.*  Gently pull your palm and fingertips back, so your wrist extends and your fingers point more toward the ground.  You should feel a gentle stretch on the inside of your forearm.  Hold this position for __________ seconds. Repeat __________ times. Complete this exercise __________ times per day. *If directed by your physician, physical therapist or athletic trainer, complete this stretch with your elbow bent, rather than extended. STRETCH - Wrist Extension   Place your right / left fingertips on a tabletop leaving your elbow slightly bent. Your fingers should point backwards.  Gently press your fingers and palm down onto the table, by straightening your elbow. You should feel a stretch on the inside of your forearm.  Hold this position for __________ seconds. Repeat __________ times. Complete this stretch __________ times per day.  STRENGTHENING EXERCISES - Epicondylitis, Medial (Golfer's Elbow) These exercises may help you when beginning to rehabilitate your injury. They may resolve your symptoms with or without further involvement from your physician, physical therapist or athletic trainer. While completing these exercises, remember:   Muscles can gain both the endurance and the strength needed for everyday activities through controlled exercises.  Complete these  exercises as instructed by your physician, physical therapist or athletic trainer. Increase the resistance and repetitions only as guided.  You may experience muscle soreness or fatigue, but the pain or discomfort you are trying to eliminate should never worsen during these exercises. If this pain does get worse, stop and make sure you are following the directions exactly. If the pain is still present after adjustments, discontinue the exercise until you can discuss the trouble with your caregiver. STRENGTH - Wrist Flexors  Sit with your right / left forearm palm-up, and fully supported on a table or countertop. Your elbow should be resting below the height of your shoulder. Allow your wrist to extend over the edge of the surface.  Loosely holding a __________ weight, or a piece of rubber exercise band or tubing, slowly curl your hand up toward your forearm.  Hold this position for __________ seconds. Slowly lower the wrist back to the starting position in a controlled manner. Repeat __________ times. Complete this exercise __________ times per day.  STRENGTH - Wrist Extensors  Sit with your right / left forearm palm-down and fully supported. Your elbow should be resting below the height of your shoulder. Allow your wrist to extend over the edge of the surface.  Loosely holding a __________ weight, or a piece of rubber exercise band or tubing, slowly curl your hand up toward your forearm.  Hold this position for __________ seconds. Slowly lower the wrist back to the starting position in a controlled manner. Repeat __________ times. Complete this exercise __________ times per day.  STRENGTH - Ulnar Deviators  Stand with a ____________________ weight in your right / left hand, or sit while holding a rubber  exercise band or tubing, with your healthy arm supported on a table or countertop.  Move your wrist so that your pinkie travels toward your forearm and your thumb moves away from your  forearm.  Hold this position for __________ seconds and then slowly lower the wrist back to the starting position. Repeat __________ times. Complete this exercise __________ times per day STRENGTH - Grip   Grasp a tennis ball, a dense sponge, or a large, rolled sock in your hand.  Squeeze as hard as you can, without increasing any pain.  Hold this position for __________ seconds. Release your grip slowly. Repeat __________ times. Complete this exercise __________ times per day.  STRENGTH - Forearm Supinators   Sit with your right / left forearm supported on a table, keeping your elbow below shoulder height. Rest your hand over the edge, palm down.  Gently grip a hammer or a soup ladle.  Without moving your elbow, slowly turn your palm and hand upward to a "thumbs-up" position.  Hold this position for __________ seconds. Slowly return to the starting position. Repeat __________ times. Complete this exercise __________ times per day.  STRENGTH - Forearm Pronators  Sit with your right / left forearm supported on a table, keeping your elbow below shoulder height. Rest your hand over the edge, palm up.  Gently grip a hammer or a soup ladle.  Without moving your elbow, slowly turn your palm and hand upward to a "thumbs-up" position.  Hold this position for __________ seconds. Slowly return to the starting position. Repeat __________ times. Complete this exercise __________ times per day.    This information is not intended to replace advice given to you by your health care provider. Make sure you discuss any questions you have with your health care provider.   Document Released: 06/17/2005 Document Revised: 07/08/2014 Document Reviewed: 09/29/2008 Elsevier Interactive Patient Education 2016 [place]. Felma Pfefferle M.D.

## 2016-04-18 DIAGNOSIS — I2510 Atherosclerotic heart disease of native coronary artery without angina pectoris: Secondary | ICD-10-CM | POA: Diagnosis not present

## 2016-04-18 DIAGNOSIS — I10 Essential (primary) hypertension: Secondary | ICD-10-CM | POA: Diagnosis not present

## 2016-04-20 ENCOUNTER — Other Ambulatory Visit: Payer: Self-pay | Admitting: Internal Medicine

## 2016-04-23 NOTE — Telephone Encounter (Signed)
Sent to the pharmacy by e-scribe.  Pt has cpx on 04/17/16.  Advised to return in 1 year.

## 2016-05-06 ENCOUNTER — Encounter: Payer: BLUE CROSS/BLUE SHIELD | Admitting: Internal Medicine

## 2016-05-21 ENCOUNTER — Other Ambulatory Visit: Payer: Self-pay

## 2016-05-21 MED ORDER — PANTOPRAZOLE SODIUM 40 MG PO TBEC: DELAYED_RELEASE_TABLET | ORAL | 3 refills | Status: DC

## 2016-07-02 NOTE — Progress Notes (Signed)
Pre visit review using our clinic review tool, if applicable. No additional management support is needed unless otherwise documented below in the visit note.  Chief Complaint  Patient presents with  . Cough    HPI: Margaret Howard 51 y.o.  sdaFor cough is lasting about a month. She thinks it started like a chest cold and discontinued. Never any fever but continues to cough. Recently coughing so much she may have thrown up. No fever shortness of breath runny nose. 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 No unusual chest pain heart pain edema. Using water at night no cough drops taking her reflux medicine negative GERD symptoms recently.   ROS: See pertinent positives and negatives per HPI.  Past Medical History:  Diagnosis Date  . Arthritis    spine, feet, hands, knees  . CAD (coronary artery disease)    LHC 06/27/11: mLAD 50-60%, ostial septal perf 50%, mRCA occluded with L-R collats, EF 55% with inf HK.  She underwent FFR of he LAD (0.87 after adenosine) and this was not hemodynamically significant  . Fall 10/20/2011  . Fibromyalgia   . GERD (gastroesophageal reflux disease)   . Heart attack 2012   1 stent  . Hypertension    has been off med. > 6 mos.  . Lipoma of thigh    left  . Positive ANA (antinuclear antibody)     Family History  Problem Relation Age of Onset  . Melanoma Mother   . Breast cancer Mother 83  . Hypertension Mother   . Melanoma Father   . Other Father     oral  . Osteoporosis Father     hx of hip fracture and died from med problems after this.from MI  . Coronary artery disease Father 49  . Cancer Father     oral, lived 13 years  . Breast cancer Maternal Aunt     early 23's  . Colon cancer Paternal Aunt   . Cancer Paternal Aunt     ovarian  . Ovarian cancer Paternal Aunt   . Colon cancer Paternal Aunt   . Colon cancer Cousin 41  . Colon cancer Cousin 74    Social History   Social History    . Marital status: Married    Spouse name: N/A  . Number of children: 3  . Years of education: N/A   Occupational History  . pre-school teacher    Social History Main Topics  . Smoking status: Former Smoker    Packs/day: 1.00    Quit date: 03/07/1986  . Smokeless tobacco: Never Used  . Alcohol use No  . Drug use: No  . Sexual activity: Yes    Partners: Male    Birth control/ protection: Post-menopausal   Other Topics Concern  . None   Social History Narrative   Occupation: Consulting civil engineer pre K   HH of 6  Now 2  Children out of home  [place] [employer] [place]   4 dogs, cat, bird, frog and mouse   G3P3   Has a grandchild      [place]    Outpatient Medications Prior to Visit  Medication Sig Dispense Refill  . aspirin 81 MG tablet Take 81 mg by mouth daily.    . carvedilol (COREG) 6.25 MG tablet Take 6.25 mg by mouth 2 (two) times daily with a meal.    . clopidogrel (PLAVIX) 75 MG tablet Take 75 mg  by mouth daily. Dr. Myriam Jacobson    . CRESTOR 20 MG tablet TAKE 1 TABELT BY MOUTH AT BEDTIME 90 tablet 3  . nitroGLYCERIN (NITROSTAT) 0.4 MG SL tablet Place 0.4 mg under the tongue every 5 (five) minutes as needed for chest pain.     . pantoprazole (PROTONIX) 40 MG tablet TAKE 1 TABLET (40 MG TOTAL) BY MOUTH 2 (TWO) TIMES DAILY BEFORE A MEAL. 180 tablet 3  . ranitidine (ZANTAC) 150 MG tablet Take 150 mg by mouth at bedtime.  12  . valsartan (DIOVAN) 80 MG tablet TAKE 1 TABLET BY MOUTH ONCE DAILY 90 tablet 2  . ketotifen (ALAWAY) 0.025 % ophthalmic solution Place 1 drop into both eyes 2 (two) times daily as needed (DRY EYES).     No facility-administered medications prior to visit.      EXAM:  BP 108/78 (BP Location: Left Arm, Patient Position: Sitting, Cuff Size: Normal)   Pulse 67   Temp 97.9 F (36.6 C) (Oral)   Wt 172 lb 9.6 oz (78.3 kg)   LMP 08/02/2010   SpO2 98%   BMI 32.61 kg/m   Body mass index is 32.61 kg/m. WDWN in NAD  quiet respirations; mildly  congested  somewhat hoarse. Non toxic . HEENT: Normocephalic ;atraumatic , Eyes;  PERRL, EOMs  Full, lids and conjunctiva clear,,Ears: no deformities, canals nl, TM landmarks normal, Nose: no deformity or discharge but congested;face minimally tender Mouth : OP clear without lesion or edema . Neck: Supple without adenopathy or masses or bruits Chest:  Clear to A without wheezes rales or rhonchi CV:  S1-S2 no gallops or murmurs peripheral perfusion is normal Skin :nl perfusion and no acute rashes   ASSESSMENT AND PLAN:  Discussed the following assessment and plan:  Cough, persistent - Plan: DG Chest 2 View Probably post viral no alarm findings except for extent extensive coughing illnesses in the community. Symptomatic treatment consider chest x-ray next week if not starting to improve. Maintain her antireflux regimen. -Patient advised to return or notify health care team  if symptoms worsen ,persist or new concerns arise.  Patient Instructions   lung exam is reassuring today. I suspect this is the coughing illness mono virus is affecting a lot of people in the community and you are having airway irritation that is keeping it going. Continue hot liquids a be a cool mist vaporizer at night hydrocodone cough medicine for comfort to hopefully get sleep. If not improving next week or worsening contact us and we'll get chest x-ray as next step. Continue your antireflux measures.    [name]. Tyreak Reagle M.D.

## 2016-07-03 ENCOUNTER — Ambulatory Visit (INDEPENDENT_AMBULATORY_CARE_PROVIDER_SITE_OTHER): Payer: BLUE CROSS/BLUE SHIELD | Admitting: Internal Medicine

## 2016-07-03 ENCOUNTER — Encounter: Payer: Self-pay | Admitting: Internal Medicine

## 2016-07-03 VITALS — BP 108/78 | HR 67 | Temp 97.9°F | Wt 172.6 lb

## 2016-07-03 DIAGNOSIS — R053 Chronic cough: Secondary | ICD-10-CM

## 2016-07-03 DIAGNOSIS — R05 Cough: Secondary | ICD-10-CM | POA: Diagnosis not present

## 2016-07-03 MED ORDER — HYDROCODONE-HOMATROPINE 5-1.5 MG/5ML PO SYRP: 5.0000 mL | ORAL_SOLUTION | Freq: Four times a day (QID) | ORAL | 0 refills | Status: AC | PRN

## 2016-07-03 NOTE — Patient Instructions (Signed)
lung exam is reassuring today. I suspect this is the coughing illness mono virus is affecting a lot of people in the community and you are having airway irritation that is keeping it going. Continue hot liquids a be a cool mist vaporizer at night hydrocodone cough medicine for comfort to hopefully get sleep. If not improving next week or worsening contact us and we'll get chest x-ray as next step. Continue your antireflux measures.

## 2016-07-24 ENCOUNTER — Telehealth: Payer: Self-pay | Admitting: Internal Medicine

## 2016-07-24 DIAGNOSIS — M2550 Pain in unspecified joint: Secondary | ICD-10-CM

## 2016-07-24 NOTE — Telephone Encounter (Signed)
Order placed in the system. 

## 2016-07-24 NOTE — Telephone Encounter (Signed)
Pt has not seen dr Estanislado Pandy ,shaili in over 2 yrs and needs a referral to see dr  for joint pain again. Pt last seen dr Regis Bill on 07-03-16

## 2016-07-29 ENCOUNTER — Ambulatory Visit (INDEPENDENT_AMBULATORY_CARE_PROVIDER_SITE_OTHER): Payer: BLUE CROSS/BLUE SHIELD | Admitting: Obstetrics and Gynecology

## 2016-07-29 ENCOUNTER — Encounter: Payer: Self-pay | Admitting: Obstetrics and Gynecology

## 2016-07-29 VITALS — BP 110/78 | HR 72 | Resp 15 | Ht 60.75 in | Wt 170.0 lb

## 2016-07-29 DIAGNOSIS — Z01419 Encounter for gynecological examination (general) (routine) without abnormal findings: Secondary | ICD-10-CM

## 2016-07-29 NOTE — Progress Notes (Signed)
51 y.o. [id number] MarriedCaucasianF here for annual exam.   No vaginal bleeding. Rarely sexually active, no pain. No vasomotor symptoms, no c/o vaginal dryness.     Patient's last menstrual period was 08/25/2010.          Sexually active: Yes.    The current method of family planning is post menopausal status.    Exercising: Yes.    walking/ Yoga  Smoker:  Former smoker   Health Maintenance: Pap:  03-30-15 WNL NEG HR HPV History of abnormal Pap:  no MMG:  11-06-15 WNL  Colonoscopy:  08-01-10 WNL  BMD:   08/13/12 osteopenia, f/u with rheumatology TDaP:  2013 Gardasil: N/A   reports that she quit smoking about 30 years ago. She smoked 1.00 pack per day. She has never used smokeless tobacco. She reports that she drinks about 3.0 oz of alcohol per week . She reports that she does not use drugs.3 kids, one grandson Works as a Freight forwarder. Her daughter and grandson lives with her (son-in-law committed suicide, PTSD). 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  Past Medical History:  Diagnosis Date  . Arthritis    spine, feet, hands, knees  . CAD (coronary artery disease)    LHC 06/27/11: mLAD 50-60%, ostial septal perf 50%, mRCA occluded with L-R collats, EF 55% with inf HK.  She underwent FFR of he LAD (0.87 after adenosine) and this was not hemodynamically significant  . Fall 10/20/2011  . Fibromyalgia   . GERD (gastroesophageal reflux disease)   . Heart attack 2012   1 stent  . Hypertension    has been off med. > 6 mos.  . Lipoma of thigh    left  . Positive ANA (antinuclear antibody)     Past Surgical History:  Procedure Laterality Date  . ANKLE FRACTURE SURGERY Left   . ANTERIOR CERVICAL DISCECTOMY  12/12/2010   and fusion C5-6, C6-7  . BREAST SURGERY  12/11   MRI guided breast biopsy  . CAROTID STENT  08/26/11   [facility]  . CARPAL TUNNEL RELEASE  06/29/2008; 08/2009   right; left  . COLONOSCOPY    . ESOPHAGOGASTRODUODENOSCOPY (EGD) WITH PROPOFOL N/A 08/10/2015   Procedure:  ESOPHAGOGASTRODUODENOSCOPY (EGD) WITH PROPOFOL;  Surgeon: Milus Banister, MD;  Location: [facility] ENDOSCOPY;  Service: Endoscopy;  Laterality: N/A;  WITH BRAVO PROBE ON MEDICINE  . FOOT SURGERY     Right, fracture repair  . FRACTIONAL FLOW RESERVE WIRE N/A 06/27/2011   Procedure: FRACTIONAL FLOW RESERVE WIRE;  Surgeon: Burnell Blanks, MD;  Location: [facility] C[facility] LAB;  Service: Cardiovascular;  Laterality: N/A;  . left foot  06/29/2008   scar tissue left foot  . LIPOMA EXCISION  12/13/2008   bilat. upper extremity masses  . [place]   microdisecetomy and laminectomy L5-S1    Current Outpatient Prescriptions  Medication Sig Dispense Refill  . aspirin 81 MG tablet Take 81 mg by mouth daily.    . carvedilol (COREG) 6.25 MG tablet Take 6.25 mg by mouth 2 (two) times daily with a meal.    . clopidogrel (PLAVIX) 75 MG tablet Take 75 mg by mouth daily. Dr. Myriam Jacobson    . CRESTOR 20 MG tablet TAKE 1 TABELT BY MOUTH AT BEDTIME 90 tablet 3  . nitroGLYCERIN (NITROSTAT) 0.4 MG SL tablet Place 0.4 mg under the tongue every 5 (five) minutes as needed for chest pain.     . pantoprazole (PROTONIX) 40 MG tablet TAKE 1 TABLET (40  MG TOTAL) BY MOUTH 2 (TWO) TIMES DAILY BEFORE A MEAL. 180 tablet 3  . ranitidine (ZANTAC) 150 MG tablet Take 150 mg by mouth at bedtime.  12  . valsartan (DIOVAN) 80 MG tablet TAKE 1 TABLET BY MOUTH ONCE DAILY 90 tablet 2   No current facility-administered medications for this visit.     Family History  Problem Relation Age of Onset  . Melanoma Mother   . Breast cancer Mother 70  . Hypertension Mother   . Melanoma Father   . Other Father     oral  . Osteoporosis Father     hx of hip fracture and died from med problems after this.from MI  . Coronary artery disease Father 14  . Cancer Father     oral, lived 74 years  . Breast cancer Maternal Aunt     early 62's  . Colon cancer Paternal Aunt   . Cancer Paternal Aunt     ovarian  . Ovarian cancer  Paternal Aunt   . Colon cancer Paternal Aunt   . Colon cancer Cousin 42  . Colon cancer Cousin 9    Review of Systems  Constitutional: Negative.   HENT: Negative.   Eyes: Negative.   Respiratory: Negative.   Cardiovascular: Negative.   Gastrointestinal: Negative.   Endocrine: Negative.   Genitourinary: Negative.   Musculoskeletal: Negative.   Skin: Negative.   Allergic/Immunologic: Negative.   Neurological: Negative.   Psychiatric/Behavioral: Negative.     Exam:   BP 110/78 (BP Location: Right Arm, Patient Position: Sitting, Cuff Size: Normal)   Pulse 72   Resp 15   Ht 5' 0.75" (1.543 m)   Wt 170 lb (77.1 kg)   LMP 08/25/2010   BMI 32.39 kg/m   Weight change: @WEIGHTCHANGE @ Height:   Height: 5' 0.75" (154.3 cm)  Ht Readings from Last 3 Encounters:  07/29/16 5' 0.75" (1.543 m)  04/17/16 5\' 1"  (1.549 m)  08/10/15 5\' 1"  (1.549 m)    General appearance: alert, cooperative and appears stated age Head: Normocephalic, without obvious abnormality, atraumatic Neck: no adenopathy, supple, symmetrical, trachea midline and thyroid normal to inspection and palpation Lungs: clear to auscultation bilaterally Cardiovascular: regular rate and rhythm Breasts: normal appearance, no masses or tenderness Heart: regular rate and rhythm Abdomen: soft, non-tender; bowel sounds normal; no masses,  no organomegaly Extremities: extremities normal, atraumatic, no cyanosis or edema Skin: Skin color, texture, turgor normal. No rashes or lesions Lymph nodes: Cervical, supraclavicular, and axillary nodes normal. No abnormal inguinal nodes palpated Neurologic: Grossly normal   Pelvic: External genitalia:  no lesions              Urethra:  normal appearing urethra with no masses, tenderness or lesions              Bartholins and Skenes: normal                 Vagina: normal appearing vagina with normal color and discharge, no lesions              Cervix: no lesions               Bimanual  Exam:  Uterus:  normal size, contour, position, consistency, mobility, non-tender              Adnexa: no mass, fullness, tenderness               Rectovaginal: Confirms  Anus:  normal sphincter tone, no lesions  Chaperone was present for exam.  A:  Well Woman with normal exam  P:   No pap this year  Labs and immunizations with primary MD  DEXA with Rheumatology  Mammogram 5/18  Colonoscopy UTD  Discussed breast self exam  Discussed calcium and vit D intake

## 2016-07-29 NOTE — Patient Instructions (Signed)

## 2016-07-31 DIAGNOSIS — H2513 Age-related nuclear cataract, bilateral: Secondary | ICD-10-CM | POA: Diagnosis not present

## 2016-08-19 NOTE — Progress Notes (Signed)
Pre visit review using our clinic review tool, if applicable. No additional management support is needed unless otherwise documented below in the visit note.  Chief Complaint  Patient presents with  . Urinary Frequency    Rt side.  Started 1.5 wks ago.  . Back Pain  . Flank Pain    HPI: Margaret Howard 51 y.o. sda see above Onset insidious onset about a week and half ago uncomfortable right flank low back area without a specific injury not associated with cardiovascular pulmonary symptoms. She denies any injury hematuria UTI symptoms except she is increased frequency but thinks that could be because she is more thirsty. Otherwise she is well stable. There is no burning rash unusual radiation. ROS: See pertinent positives and negatives per HPI.  Past Medical History:  Diagnosis Date  . Arthritis    spine, feet, hands, knees  . CAD (coronary artery disease)    LHC 06/27/11: mLAD 50-60%, ostial septal perf 50%, mRCA occluded with L-R collats, EF 55% with inf HK.  She underwent FFR of he LAD (0.87 after adenosine) and this was not hemodynamically significant  . Fall 10/20/2011  . Fibromyalgia   . GERD (gastroesophageal reflux disease)   . Heart attack 2012   1 stent  . Hypertension    has been off med. > 6 mos.  . Lipoma of thigh    left  . Positive ANA (antinuclear antibody)     Family History  Problem Relation Age of Onset  . Melanoma Mother   . Breast cancer Mother 42  . Hypertension Mother   . Melanoma Father   . Other Father     oral  . Osteoporosis Father     hx of hip fracture and died from med problems after this.from MI  . Coronary artery disease Father 85  . Cancer Father     oral, lived 28 years  . Breast cancer Maternal Aunt     early 1's  . Colon cancer Paternal Aunt   . Cancer Paternal Aunt     ovarian  . Ovarian cancer Paternal Aunt   . Colon cancer Paternal Aunt   . Colon cancer Cousin 26  . Colon cancer Cousin 30    Social History   Social  History  . Marital status: Married    Spouse name: N/A  . Number of children: 3  . Years of education: N/A   Occupational History  . pre-school teacher    Social History Main Topics  . Smoking status: Former Smoker    Packs/day: 1.00    Quit date: 03/07/1986  . Smokeless tobacco: Never Used  . Alcohol use 3.0 oz/week    5 Standard drinks or equivalent per week  . Drug use: No  . Sexual activity: Yes    Partners: Male    Birth control/ protection: Post-menopausal   Other Topics Concern  . None   Social History Narrative   Occupation: Consulting civil engineer pre K   HH of 6  Now 2  Children out of home  [place] [employer] [place]   4 dogs, cat, bird, frog and mouse   G3P3   Has a grandchild      [place]    Outpatient Medications Prior to Visit  Medication Sig Dispense Refill  . aspirin 81 MG tablet Take 81 mg by mouth daily.    . carvedilol (COREG) 6.25 MG tablet Take 6.25 mg by mouth 2 (two) times daily with a meal.    .  clopidogrel (PLAVIX) 75 MG tablet Take 75 mg by mouth daily. Dr. Myriam Jacobson    . CRESTOR 20 MG tablet TAKE 1 TABELT BY MOUTH AT BEDTIME 90 tablet 3  . nitroGLYCERIN (NITROSTAT) 0.4 MG SL tablet Place 0.4 mg under the tongue every 5 (five) minutes as needed for chest pain.     . pantoprazole (PROTONIX) 40 MG tablet TAKE 1 TABLET (40 MG TOTAL) BY MOUTH 2 (TWO) TIMES DAILY BEFORE A MEAL. 180 tablet 3  . ranitidine (ZANTAC) 150 MG tablet Take 150 mg by mouth at bedtime.  12  . valsartan (DIOVAN) 80 MG tablet TAKE 1 TABLET BY MOUTH ONCE DAILY 90 tablet 2   No facility-administered medications prior to visit.      EXAM:  BP 100/74 (BP Location: Left Arm, Patient Position: Sitting, Cuff Size: Normal)   Temp 97.9 F (36.6 C) (Oral)   Wt 170 lb 3.2 oz (77.2 kg)   LMP 08/25/2010   BMI 32.42 kg/m   Body mass index is 32.42 kg/m.  GENERAL: vitals reviewed and listed above, alert, oriented, appears well hydrated and in no acute distress HEENT: atraumatic,  conjunctiva  clear, no obvious abnormalities on inspection of external nose and ears NECK: no obvious masses on inspection palpation  LUNGS: clear to auscultation bilaterally, no wheezes, rales or rhonchi, good air movement CV: HRRR, no clubbing cyanosis or  peripheral edema nl cap refill  Abdomen soft without organomegaly guarding or rebound back she points to the right lateral lower back flank area no midline tenderness no psoas sign. MS: moves all extremities without noticeable focal  abnormality PSYCH: pleasant and cooperative, no obvious depression or anxiety  ASSESSMENT AND PLAN:  Discussed the following assessment and plan:  Back pain, unspecified back location, unspecified back pain laterality, unspecified chronicity - Plan: POCT Urinalysis Dipstick (Automated), Culture, Urine  Flank pain - Plan: POCT Urinalysis Dipstick (Automated), Culture, Urine  Urinary frequency - Plan: POCT Urinalysis Dipstick (Automated), Culture, Urine Uncertain cause but May be musculoskeletal don't suspect pulmonary GI or cardiovascular disease. Trace urine leukocytes will check for infection but this point would not add antibiotic without other symptoms or positive culture. She can use Aleve if she as per her cardiologist with caution following assessment persistent progressive -Patient advised to return or notify health care team  if symptoms worsen ,persist or new concerns arise.  Patient Instructions  Your discomfort could be musculoskeletal although I can't tell why it starts now. Nothing alarming on exam or history. Minimal amount of inflammation in the urine which could be normal. Checking a urine culture to check for urine infection. At this time observation if get fever or worsening pain contact us. We'll let you know when the urine culture is back.        [name]. Octaviano Mukai M.D.

## 2016-08-20 ENCOUNTER — Ambulatory Visit (INDEPENDENT_AMBULATORY_CARE_PROVIDER_SITE_OTHER): Payer: BLUE CROSS/BLUE SHIELD | Admitting: Internal Medicine

## 2016-08-20 ENCOUNTER — Encounter: Payer: Self-pay | Admitting: Internal Medicine

## 2016-08-20 VITALS — BP 100/74 | Temp 97.9°F | Wt 170.2 lb

## 2016-08-20 DIAGNOSIS — M549 Dorsalgia, unspecified: Secondary | ICD-10-CM

## 2016-08-20 DIAGNOSIS — R109 Unspecified abdominal pain: Secondary | ICD-10-CM | POA: Diagnosis not present

## 2016-08-20 DIAGNOSIS — R350 Frequency of micturition: Secondary | ICD-10-CM | POA: Diagnosis not present

## 2016-08-20 LAB — POC URINALSYSI DIPSTICK (AUTOMATED)
BILIRUBIN UA: NEGATIVE
Glucose, UA: NEGATIVE
KETONES UA: NEGATIVE
Nitrite, UA: NEGATIVE
PH UA: 7
PROTEIN UA: NEGATIVE
RBC UA: NEGATIVE
SPEC GRAV UA: 1.02
Urobilinogen, UA: 2

## 2016-08-20 NOTE — Patient Instructions (Addendum)
Your discomfort could be musculoskeletal although I can't tell why it starts now. Nothing alarming on exam or history. Minimal amount of inflammation in the urine which could be normal. Checking a urine culture to check for urine infection. At this time observation if get fever or worsening pain contact us. We'll let you know when the urine culture is back.

## 2016-08-21 LAB — URINE CULTURE

## 2016-08-22 ENCOUNTER — Telehealth: Payer: Self-pay | Admitting: Internal Medicine

## 2016-08-22 MED ORDER — METHOCARBAMOL 500 MG PO TABS: 500.0000 mg | ORAL_TABLET | Freq: Four times a day (QID) | ORAL | 1 refills | Status: AC | PRN

## 2016-08-22 NOTE — Telephone Encounter (Signed)
Sent in

## 2016-08-22 NOTE — Telephone Encounter (Signed)
Pt was seen on 02-17-17 for back pain and would like muscle relaxers send to [facility]

## 2016-08-23 ENCOUNTER — Other Ambulatory Visit: Payer: Self-pay | Admitting: Family Medicine

## 2016-08-23 MED ORDER — AMOXICILLIN 500 MG PO CAPS: 500.0000 mg | ORAL_CAPSULE | Freq: Three times a day (TID) | ORAL | 0 refills | Status: DC

## 2016-08-27 DIAGNOSIS — D2272 Melanocytic nevi of left lower limb, including hip: Secondary | ICD-10-CM | POA: Diagnosis not present

## 2016-08-27 DIAGNOSIS — L858 Other specified epidermal thickening: Secondary | ICD-10-CM | POA: Diagnosis not present

## 2016-08-27 DIAGNOSIS — D692 Other nonthrombocytopenic purpura: Secondary | ICD-10-CM | POA: Diagnosis not present

## 2016-08-27 DIAGNOSIS — L821 Other seborrheic keratosis: Secondary | ICD-10-CM | POA: Diagnosis not present

## 2016-08-28 ENCOUNTER — Other Ambulatory Visit (INDEPENDENT_AMBULATORY_CARE_PROVIDER_SITE_OTHER): Payer: Self-pay | Admitting: Otolaryngology

## 2016-08-28 DIAGNOSIS — R1310 Dysphagia, unspecified: Secondary | ICD-10-CM

## 2016-08-28 DIAGNOSIS — R1312 Dysphagia, oropharyngeal phase: Secondary | ICD-10-CM | POA: Diagnosis not present

## 2016-08-28 DIAGNOSIS — K219 Gastro-esophageal reflux disease without esophagitis: Secondary | ICD-10-CM | POA: Diagnosis not present

## 2016-09-05 ENCOUNTER — Ambulatory Visit (HOSPITAL_COMMUNITY)
Admission: RE | Admit: 2016-09-05 | Discharge: 2016-09-05 | Disposition: A | Discharge status: Home / Self Care | Payer: BLUE CROSS/BLUE SHIELD | Source: Ambulatory Visit | Attending: Otolaryngology | Admitting: Otolaryngology

## 2016-09-05 DIAGNOSIS — R1310 Dysphagia, unspecified: Secondary | ICD-10-CM | POA: Diagnosis present

## 2016-09-05 DIAGNOSIS — K224 Dyskinesia of esophagus: Secondary | ICD-10-CM | POA: Insufficient documentation

## 2016-09-10 NOTE — Progress Notes (Signed)
Office Visit Note  Patient: Margaret Howard             Date of Birth: May 31, 1966           MRN: [id number]             PCP: Lottie Dawson, MD Referring: Burnis Medin, MD Visit Date: 09/13/2016 Occupation: Preschool teacher    Subjective:  Dry mouth and dry eyes   History of Present Illness: Margaret Howard is a 51 y.o. female with history of osteoarthritis fibromyalgia returns today after her last visit in November 2014. She states she did quite well for a while doing gentle yoga. She notices some sun exposed rash and sensitivity to skin. She has seen a dermatologist who diagnosed her with dry skin and gave some topical steroid cream. She's been having worsening of her sicca symptoms with dry mouth and dry eyes she's also had problems with difficulty swallowing. She was having increased reflux symptoms for which she saw a gastroenterologist in endoscopy was normal. She also has chronic dry cough. She was seen by ENT and laryngoscopy was normal per patient. She had swallowing study recently which was also normal. She's been experiencing vaginal dryness and seeing GYN. She's also having problems with right third trigger finger. She reports occasional swelling in the wrist and her left ankle.  Activities of Daily Living:  Patient reports morning stiffness for 1 hour.   Patient Reports nocturnal pain.  Difficulty dressing/grooming: Denies Difficulty climbing stairs: Reports Difficulty getting out of chair: Reports Difficulty using hands for taps, buttons, cutlery, and/or writing: Reports   Review of Systems  Constitutional: Positive for fatigue. Negative for night sweats, weight gain, weight loss and weakness.  HENT: Positive for mouth dryness. Negative for mouth sores, trouble swallowing, trouble swallowing and nose dryness.   Eyes: Positive for dryness. Negative for pain, redness and visual disturbance.  Respiratory: Positive for cough. Negative for shortness of breath and difficulty  breathing.   Cardiovascular: Negative for chest pain, palpitations, hypertension, irregular heartbeat and swelling in legs/feet.  Gastrointestinal: Negative for blood in stool, constipation and diarrhea.  Endocrine: Negative for increased urination.  Genitourinary: Positive for vaginal dryness.  Musculoskeletal: Positive for arthralgias, joint pain, joint swelling and morning stiffness. Negative for myalgias, muscle weakness, muscle tenderness and myalgias.  Skin: Positive for color change and rash. Negative for hair loss, skin tightness, ulcers and sensitivity to sunlight.  Allergic/Immunologic: Negative for susceptible to infections.  Neurological: Negative for dizziness, memory loss and night sweats.  Hematological: Negative for swollen glands.  Psychiatric/Behavioral: Negative for depressed mood and sleep disturbance. The patient is not nervous/anxious.     PMFS History:  Patient Active Problem List   Diagnosis Date Noted  . Primary osteoarthritis of both hands 09/13/2016  . Primary osteoarthritis of both knees 09/13/2016  . DJD (degenerative joint disease), cervical 09/13/2016  . DDD lumbar spine 09/13/2016  . Abnormal laboratory test 09/13/2016  . Atherosclerosis of native coronary artery of native heart without angina pectoris 12/21/2014  . History of degenerative disc disease 09/20/2014  . S/P coronary artery stent placement 08/29/2014  . Medication management 09/28/2013  . Weight gain 09/28/2013  . Osteopenia 08/11/2012  . Contusion 10/20/2011  . Other and unspecified hyperlipidemia 09/19/2011  . Dyslipidemia 07/23/2011  . CAD (coronary artery disease) 06/27/2011  . Chest pain 06/04/2011  . Arthritis 01/15/2011  . Amenorrhea 01/15/2011  . Swelling of ankle 11/23/2010  . Anxiety reaction 11/23/2010  . Positive ANA (antinuclear  antibody) 11/23/2010  . Positive ANA (antinuclear antibody)   . DJD (degenerative joint disease), thoracolumbar 10/22/2010  . Lipoma of thigh  10/22/2010  . Incidental pulmonary nodule, greater than or equal to 54mm 09/20/2010  . ABDOMINAL PAIN RIGHT UPPER QUADRANT 09/11/2010  . Dysphagia 09/03/2010  . OTHER DYSPHAGIA 09/03/2010  . RECTAL PAIN 07/16/2010  . PELVIC  PAIN 07/16/2010  . DELAYED MENSES 05/31/2010  . MUSCLE SPASM, TRAPEZIUS MUSCLE, RIGHT 05/14/2010  . MASS, LEFT AXILLA 07/07/2009  . TOBACCO USE, QUIT 04/25/2009  . CONSTIPATION 02/22/2009  . LUMBAR RADICULOPATHY, LEFT 07/29/2008  . ADJ DISORDER WITH MIXED ANXIETY & DEPRESSED MOOD 10/19/2007  . Essential hypertension 02/24/2007  . Pain in joint 02/18/2007  . FIBROMYALGIA 02/18/2007  . ADVEF, DRUG/MED/BIOL SUBST, OTHER DRUG NOS 01/21/2007    Past Medical History:  Diagnosis Date  . Arthritis    spine, feet, hands, knees  . CAD (coronary artery disease)    LHC 06/27/11: mLAD 50-60%, ostial septal perf 50%, mRCA occluded with L-R collats, EF 55% with inf HK.  She underwent FFR of he LAD (0.87 after adenosine) and this was not hemodynamically significant  . Fall 10/20/2011  . Fibromyalgia   . GERD (gastroesophageal reflux disease)   . Heart attack 2012   1 stent  . Hypertension    has been off med. > 6 mos.  . Lipoma of thigh    left  . Positive ANA (antinuclear antibody)     Family History  Problem Relation Age of Onset  . Melanoma Mother   . Breast cancer Mother 61  . Hypertension Mother   . Melanoma Father   . Other Father     oral  . Osteoporosis Father     hx of hip fracture and died from med problems after this.from MI  . Coronary artery disease Father 50  . Cancer Father     oral, lived 66 years  . Breast cancer Maternal Aunt     early 78's  . Colon cancer Paternal Aunt   . Cancer Paternal Aunt     ovarian  . Ovarian cancer Paternal Aunt   . Colon cancer Paternal Aunt   . Colon cancer Cousin 77  . Colon cancer Cousin 65   Past Surgical History:  Procedure Laterality Date  . ANKLE FRACTURE SURGERY Left   . ANTERIOR CERVICAL DISCECTOMY   12/12/2010   and fusion C5-6, C6-7  . BREAST SURGERY  12/11   MRI guided breast biopsy  . CAROTID STENT  08/26/11   [facility]  . CARPAL TUNNEL RELEASE  06/29/2008; 08/2009   right; left  . COLONOSCOPY    . ESOPHAGOGASTRODUODENOSCOPY (EGD) WITH PROPOFOL N/A 08/10/2015   Procedure: ESOPHAGOGASTRODUODENOSCOPY (EGD) WITH PROPOFOL;  Surgeon: Milus Banister, MD;  Location: [facility] ENDOSCOPY;  Service: Endoscopy;  Laterality: N/A;  WITH BRAVO PROBE ON MEDICINE  . FOOT SURGERY     Right, fracture repair  . FRACTIONAL FLOW RESERVE WIRE N/A 06/27/2011   Procedure: FRACTIONAL FLOW RESERVE WIRE;  Surgeon: Burnell Blanks, MD;  Location: [facility] C[facility] LAB;  Service: Cardiovascular;  Laterality: N/A;  . left foot  06/29/2008   scar tissue left foot  . LIPOMA EXCISION  12/13/2008   bilat. upper extremity masses  . 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 She appears well-developed and well-nourished.  HENT:  Head: Normocephalic and atraumatic.  Eyes: Conjunctivae and EOM are normal.  Neck: Normal range of motion.  Cardiovascular: Normal rate, regular rhythm, normal heart sounds and intact distal pulses.   Pulmonary/Chest: Effort normal and breath sounds normal.  Abdominal: Soft. Bowel sounds are normal.  Lymphadenopathy:    She has no cervical adenopathy.  Neurological: She is alert and oriented to person, place, and time.  Skin: Skin is warm and dry. Capillary refill takes less than 2 seconds.  Psychiatric: She has a  normal mood and affect. Her behavior is normal.  Nursing note and vitals reviewed.    Musculoskeletal Exam: C-spine limited range of motion and thoracic spine good range of motion, lumbar spine limited range of motion with some discomfort. Shoulder joints elbow joints wrist joints with good range of motion. She had PIP/DIP thickening bilaterally she also had right third trigger finger. Hip joints knee joints ankles MTPs PIPs with good range of motion. No synovitis was noted on examination today fibromyalgia tender points were 12 out of 18 positive  CDAI Exam: No CDAI exam completed.    Investigation: Findings:  04/05/2016 CBC normal, CMP normal, TSH normal    Imaging: Dg Esophagus  Result Date: 09/05/2016 CLINICAL DATA:  Dysphagia.  Solids gets stuck in throat. EXAM: ESOPHOGRAM / BARIUM SWALLOW / BARIUM TABLET STUDY TECHNIQUE: Combined double contrast and single contrast examination performed using effervescent crystals, thick barium liquid, and thin barium liquid. The patient was observed with fluoroscopy swallowing a 13 mm barium sulphate tablet. FLUOROSCOPY TIME:  Fluoroscopy Time:  1 minutes 48 seconds Radiation Exposure Index (if provided by the fluoroscopic device): Number of Acquired Spot Images: 0 COMPARISON:  None. FINDINGS: Fluoroscopic evaluation of swallowing demonstrates normal pharyngeal function. There is disruption of 3 out of 4 primary esophageal peristaltic waves with proximal escape. No fixed stricture, fold thickening or mass. No reflux with the water siphon maneuver. No hiatal hernia. The patient swallowed a 13 mm barium tablet which freely passed into the stomach. IMPRESSION: Nonspecific esophageal motility disorder. No fixed stricture. Electronically Signed   By: Rolm Baptise M.D.   On: 09/05/2016 15:17    Speciality Comments: No specialty comments available.    Procedures:  No procedures performed Allergies: Patient has no known allergies.   Assessment / Plan:       Visit Diagnoses: Sicca syndrome, unspecified ([place]) -patient has long-standing history of sicca symptoms. She is trying mouth, dry eyes, vaginal dryness and dry skin. She's had positive beta-2 and anticardiolipin antibodies in the past. I will check some of her autoimmune labs today. Plan: Urinalysis, Routine w reflex microscopic, Sedimentation rate, Protein electrophoresis, serum, IgG, IgA, IgM, Rheumatoid factor, ANA, C3 and C4, Beta-2 glycoprotein antibodies, Lupus anticoagulant panel, Cardiolipin antibodies, IgG, IgM, IgA, [id number] ENA PANEL. In the meantime I'll prescribe her pilocarpine 5 mg by mouth 3 times a day when necessary  Abnormal laboratory test - Positive beta-2 and positive anticardiolipin in the past  Other  fatigue - Plan: CBC with Differential/Platelet, COMPLETE METABOLIC PANEL WITH GFR  Primary osteoarthritis of both hands: Joint protection and muscle strengthening discussed.  Primary osteoarthritis of both knees: Weight loss diet and exercise discussed.  DJD (degenerative joint disease), cervical - status post discectomy  DDD lumbar spine - Status post discectomy  Osteopenia of multiple sites - Plan: VITAMIN D 25 Hydroxy (Vit-D Deficiency, Fractures), DG DXA FRACTURE ASSESSMENT  Fibromyalgia: She continues to have some discomfort.  Essential hypertension  Anxiety reaction  Dyslipidemia  S/P coronary artery stent placement    Orders: Orders Placed This Encounter  Procedures  . DG DXA FRACTURE ASSESSMENT  . CBC with Differential/Platelet  . COMPLETE METABOLIC PANEL WITH GFR  . Urinalysis, Routine w reflex microscopic  . Sedimentation rate  . Protein electrophoresis, serum  . IgG, IgA, IgM  . Rheumatoid factor  . ANA  . C3 and C4  . Beta-2 glycoprotein antibodies  . Lupus anticoagulant panel  . Cardiolipin antibodies, IgG, IgM, IgA  . [id number] ENA PANEL  . VITAMIN D 25 Hydroxy (Vit-D Deficiency, Fractures)   Meds ordered this encounter  Medications   . pilocarpine (SALAGEN) 5 MG tablet    Sig: Take 1 tablet (5 mg total) by mouth 3 (three) times daily as needed.    Dispense:  90 tablet    Refill:  0    Face-to-face time spent with patient was 50 minutes. 50% of time was spent in counseling and coordination of care.  Follow-Up Instructions: Return for sicca, OA,.   Bo Merino, MD  Note - This record has been created using Editor, commissioning.  Chart creation errors have been sought, but may not always  have been located. Such creation errors do not reflect on  the standard of medical care.

## 2016-09-13 ENCOUNTER — Ambulatory Visit (INDEPENDENT_AMBULATORY_CARE_PROVIDER_SITE_OTHER): Payer: BLUE CROSS/BLUE SHIELD | Admitting: Rheumatology

## 2016-09-13 ENCOUNTER — Other Ambulatory Visit: Payer: Self-pay | Admitting: Rheumatology

## 2016-09-13 ENCOUNTER — Encounter: Payer: Self-pay | Admitting: Rheumatology

## 2016-09-13 VITALS — BP 118/62 | HR 68 | Resp 16 | Ht 61.0 in | Wt 172.0 lb

## 2016-09-13 DIAGNOSIS — M8589 Other specified disorders of bone density and structure, multiple sites: Secondary | ICD-10-CM | POA: Diagnosis not present

## 2016-09-13 DIAGNOSIS — Z955 Presence of coronary angioplasty implant and graft: Secondary | ICD-10-CM | POA: Diagnosis not present

## 2016-09-13 DIAGNOSIS — E785 Hyperlipidemia, unspecified: Secondary | ICD-10-CM

## 2016-09-13 DIAGNOSIS — M5030 Other cervical disc degeneration, unspecified cervical region: Secondary | ICD-10-CM

## 2016-09-13 DIAGNOSIS — M3500 Sicca syndrome, unspecified: Secondary | ICD-10-CM

## 2016-09-13 DIAGNOSIS — M47812 Spondylosis without myelopathy or radiculopathy, cervical region: Secondary | ICD-10-CM

## 2016-09-13 DIAGNOSIS — M47816 Spondylosis without myelopathy or radiculopathy, lumbar region: Secondary | ICD-10-CM | POA: Diagnosis not present

## 2016-09-13 DIAGNOSIS — F411 Generalized anxiety disorder: Secondary | ICD-10-CM

## 2016-09-13 DIAGNOSIS — R5383 Other fatigue: Secondary | ICD-10-CM | POA: Diagnosis not present

## 2016-09-13 DIAGNOSIS — M65331 Trigger finger, right middle finger: Secondary | ICD-10-CM

## 2016-09-13 DIAGNOSIS — I10 Essential (primary) hypertension: Secondary | ICD-10-CM | POA: Diagnosis not present

## 2016-09-13 DIAGNOSIS — M19041 Primary osteoarthritis, right hand: Secondary | ICD-10-CM | POA: Diagnosis not present

## 2016-09-13 DIAGNOSIS — M19042 Primary osteoarthritis, left hand: Secondary | ICD-10-CM | POA: Insufficient documentation

## 2016-09-13 DIAGNOSIS — R899 Unspecified abnormal finding in specimens from other organs, systems and tissues: Secondary | ICD-10-CM | POA: Insufficient documentation

## 2016-09-13 DIAGNOSIS — M170 Bilateral primary osteoarthritis of knee: Secondary | ICD-10-CM

## 2016-09-13 DIAGNOSIS — M797 Fibromyalgia: Secondary | ICD-10-CM

## 2016-09-13 LAB — CBC WITH DIFFERENTIAL/PLATELET
BASOS PCT: 0 %
Basophils Absolute: 0 cells/uL (ref 0–200)
EOS ABS: 168 {cells}/uL (ref 15–500)
Eosinophils Relative: 3 %
HCT: 42.1 % (ref 35.0–45.0)
Hemoglobin: 14.4 g/dL (ref 11.7–15.5)
Lymphocytes Relative: 27 %
Lymphs Abs: 1512 cells/uL (ref 850–3900)
MCH: 32.5 pg (ref 27.0–33.0)
MCHC: 34.2 g/dL (ref 32.0–36.0)
MCV: 95 fL (ref 80.0–100.0)
MONO ABS: 392 {cells}/uL (ref 200–950)
MONOS PCT: 7 %
MPV: 9.6 fL (ref 7.5–12.5)
NEUTROS ABS: 3528 {cells}/uL (ref 1500–7800)
Neutrophils Relative %: 63 %
PLATELETS: 241 10*3/uL (ref 140–400)
RBC: 4.43 MIL/uL (ref 3.80–5.10)
RDW: 12.7 % (ref 11.0–15.0)
WBC: 5.6 10*3/uL (ref 3.8–10.8)

## 2016-09-13 LAB — COMPLETE METABOLIC PANEL WITH GFR
ALT: 20 U/L (ref 6–29)
AST: 20 U/L (ref 10–35)
Albumin: 4.4 g/dL (ref 3.6–5.1)
Alkaline Phosphatase: 99 U/L (ref 33–130)
BILIRUBIN TOTAL: 0.8 mg/dL (ref 0.2–1.2)
BUN: 10 mg/dL (ref 7–25)
CO2: 21 mmol/L (ref 20–31)
CREATININE: 0.66 mg/dL (ref 0.50–1.05)
Calcium: 9.4 mg/dL (ref 8.6–10.4)
Chloride: 104 mmol/L (ref 98–110)
GFR, Est Non African American: >89 mL/min (ref >=60)
GLUCOSE: 95 mg/dL (ref 65–99)
Potassium: 4.2 mmol/L (ref 3.5–5.3)
Sodium: 141 mmol/L (ref 135–146)
TOTAL PROTEIN: 7.5 g/dL (ref 6.1–8.1)

## 2016-09-13 MED ORDER — PILOCARPINE HCL 5 MG PO TABS: 5.0000 mg | ORAL_TABLET | Freq: Three times a day (TID) | ORAL | 0 refills | Status: DC | PRN

## 2016-09-13 NOTE — Patient Instructions (Signed)
Pilocarpine tablets What is this medicine? PILOCARPINE (PYE loe KAR peen) can increase saliva in the mouth. This medicine is used to treat dry mouth from radiation treatment or from Sjogren's syndrome. This medicine may be used for other purposes; ask your health care provider or pharmacist if you have questions. COMMON BRAND NAME(S): [place] What should I tell my health care provider before I take this medicine? They need to know if you have any of these conditions: -eye infection or other eye problems -glaucoma -heart disease -liver disease -lung or breathing disease, like asthma -an unusual or allergic reaction to pilocarpine, other medicines, foods, dyes, or preservatives -pregnant or trying to get pregnant -breast-feeding How should I use this medicine? Take this medicine by mouth with a glass of water. Follow the directions on the prescription label. Take your doses at regular intervals. Do not take your medicine more often than directed. Talk to your pediatrician regarding the use of this medicine in children. Special care may be needed. Overdosage: If you think you have taken too much of this medicine contact a poison control center or emergency room at once. NOTE: This medicine is only for you. Do not share this medicine with others. What if I miss a dose? If you miss a dose, take it as soon as you can. If it is almost time for your next dose, take only that dose. Do not take double or extra doses. What may interact with this medicine? -antihistamines for allergy, cough and cold -atropine -certain medicines for Alzheimer's disease like donepezil, galantamine, rivastigmine -certain medicines for bladder problems like bethanecol, oxybutynin, tolterodine -certain medicines for Parkinson's disease like benztropine, trihexyphenidyl -certain medicines for quitting smoking like nicotine -certain medicines for stomach problems like dicyclomine, hyoscyamine -certain medicines for travel  sickness like scopolamine -ipratropium -medicines for blood pressure or heart problems like metoprolol This list may not describe all possible interactions. Give your health care provider a list of all the medicines, herbs, non-prescription drugs, or dietary supplements you use. Also tell them if you smoke, drink alcohol, or use illegal drugs. Some items may interact with your medicine. What should I watch for while using this medicine? Visit your doctor for regular check ups. Tell your doctor if your symptoms do not get better or if they get worse. You may get blurry vision or have trouble telling how far something is from you. This may be a problem at night or when the lights are low. Do not drive, use machinery, or do anything that needs clear vision until you know how this medicine affects you. If you sweat a lot, drink enough to replace fluids. Do not get dehydrated. What side effects may I notice from receiving this medicine? Side effects that you should report to your doctor or health care professional as soon as possible: -allergic reactions like skin rash, itching or hives -breathing problems -confusion -irregular heartbeat -stomach pains -tremor -unusual blood pressure -unusually weak or tired -vomiting Side effects that usually do not require medical attention (report to your doctor or health care professional if they continue or are bothersome): -changes in vision -chills, flushing -diarrhea -headache -increased sweating -nausea -runny eyes, nose -urgent need to pass urine This list may not describe all possible side effects. Call your doctor for medical advice about side effects. You may report side effects to FDA at 1-800-FDA-1088. Where should I keep my medicine? Keep out of the reach of children. Store at room temperature between 15 and 30 degrees C (59 and   86 degrees F). Throw away any unused medicine after the expiration date. NOTE: This sheet is a summary. It may  not cover all possible information. If you have questions about this medicine, talk to your doctor, pharmacist, or health care provider.  2018 Elsevier/Gold Standard (2008-01-14 15:03:54)  

## 2016-09-13 NOTE — Progress Notes (Signed)
Patient was prescribed pilocarpine during today's visit for her dry mouth and dry eye.  Counseled patient on the purpose, proper use, and adverse effects of pilocarpine.  Noted that patient is on carvedilol.  Counseled patient that use of pilocarpine and carvedilol has a potential for cardiac conduction disturbances and she should monitor for signs and symptoms of cardiac conduction disturbances.  Patient voiced understanding.  Discussed use of over the counter products for dry mouth and dry eyes, and patient reports she has already tried over the counter products without relief.  Patient wants to try pilocarpine.  Patient denies any questions or concerns regarding her medications at this time.    Elisabeth Most, Pharm.D., BCPS, CPP Clinical Pharmacist Pager: [phone] Phone: [phone] 09/13/2016 9:12 AM

## 2016-09-14 LAB — VITAMIN D 25 HYDROXY (VIT D DEFICIENCY, FRACTURES): VIT D 25 HYDROXY: 35 ng/mL (ref 30–100)

## 2016-09-14 LAB — URINALYSIS, ROUTINE W REFLEX MICROSCOPIC
BILIRUBIN URINE: NEGATIVE
GLUCOSE, UA: NEGATIVE
Hgb urine dipstick: NEGATIVE
Ketones, ur: NEGATIVE
LEUKOCYTES UA: NEGATIVE
Nitrite: NEGATIVE
PH: 6 (ref 5.0–8.0)
Protein, ur: NEGATIVE
SPECIFIC GRAVITY, URINE: 1.011 (ref 1.001–1.035)

## 2016-09-14 LAB — CP5000020 ENA PANEL
DS DNA AB: 1 [IU]/mL
ENA SM Ab Ser-aCnc: <1
Ribonucleic Protein(ENA) Antibody, IgG: <1
SCLERODERMA (SCL-70) (ENA) ANTIBODY, IGG: NEGATIVE
SSA (Ro) (ENA) Antibody, IgG: <1
SSB (La) (ENA) Antibody, IgG: <1

## 2016-09-14 LAB — SEDIMENTATION RATE: Sed Rate: 28 mm/hr (ref 0–30)

## 2016-09-16 LAB — ANTI-NUCLEAR AB-TITER (ANA TITER)

## 2016-09-16 LAB — ANA: ANA: POSITIVE — AB

## 2016-09-16 LAB — C3 AND C4
C3 COMPLEMENT: 132 mg/dL (ref 83–193)
C4 Complement: 34 mg/dL (ref 15–57)

## 2016-09-16 LAB — IGG, IGA, IGM
IGA: 236 mg/dL (ref 81–463)
IGM, SERUM: 333 mg/dL — AB (ref 48–271)
IgG (Immunoglobin G), Serum: 867 mg/dL (ref 694–1618)

## 2016-09-16 LAB — RHEUMATOID FACTOR

## 2016-09-16 LAB — CARDIOLIPIN ANTIBODIES, IGG, IGM, IGA: Anticardiolipin IgA: <11 [APL'U]

## 2016-09-17 LAB — PROTEIN ELECTROPHORESIS, SERUM
ALBUMIN ELP: 4.3 g/dL (ref 3.8–4.8)
ALPHA-1-GLOBULIN: 0.5 g/dL — AB (ref 0.2–0.3)
ALPHA-2-GLOBULIN: 0.8 g/dL (ref 0.5–0.9)
Beta 2: 0.4 g/dL (ref 0.2–0.5)
Beta Globulin: 0.5 g/dL (ref 0.4–0.6)
Gamma Globulin: 1.1 g/dL (ref 0.8–1.7)
Total Protein, Serum Electrophoresis: 7.5 g/dL (ref 6.1–8.1)

## 2016-09-17 LAB — RFX DRVVT SCR W/RFLX CONF 1:1 MIX: dRVVT Screen: 34 s (ref <=45)

## 2016-09-17 LAB — LUPUS ANTICOAGULANT PANEL

## 2016-09-17 LAB — BETA-2 GLYCOPROTEIN ANTIBODIES
Beta-2 Glyco I IgG: <9 SGU (ref <=20)
Beta-2-Glycoprotein I IgM: 20 SMU (ref <=20)

## 2016-09-17 LAB — RFX PTT-LA W/RFX TO HEX PHASE CONF: PTT-LA Screen: 37 s (ref <=40)

## 2016-09-17 NOTE — Progress Notes (Signed)
Please add IFE. We'll discuss labs at follow-up visit

## 2016-09-20 LAB — PROTEIN ELECTROPHORESIS, SERUM, WITH REFLEX
ALPHA-2-GLOBULIN: 0.8 g/dL (ref 0.5–0.9)
Albumin ELP: 4.2 g/dL (ref 3.8–4.8)
Alpha-1-Globulin: 0.4 g/dL — ABNORMAL HIGH (ref 0.2–0.3)
BETA 2: 0.4 g/dL (ref 0.2–0.5)
BETA GLOBULIN: 0.5 g/dL (ref 0.4–0.6)
Gamma Globulin: 1.1 g/dL (ref 0.8–1.7)
Total Protein, Serum Electrophoresis: 7.3 g/dL (ref 6.1–8.1)

## 2016-09-20 LAB — IFE INTERPRETATION

## 2016-10-01 ENCOUNTER — Other Ambulatory Visit: Payer: Self-pay | Admitting: Obstetrics & Gynecology

## 2016-10-01 DIAGNOSIS — Z1231 Encounter for screening mammogram for malignant neoplasm of breast: Secondary | ICD-10-CM

## 2016-10-09 ENCOUNTER — Encounter: Payer: Self-pay | Admitting: Rheumatology

## 2016-10-09 DIAGNOSIS — Z780 Asymptomatic menopausal state: Secondary | ICD-10-CM | POA: Diagnosis not present

## 2016-10-09 DIAGNOSIS — M85852 Other specified disorders of bone density and structure, left thigh: Secondary | ICD-10-CM | POA: Diagnosis not present

## 2016-10-16 NOTE — Progress Notes (Signed)
Office Visit Note  Patient: Margaret Howard             Date of Birth: 1966-06-19           MRN: [id number]             PCP: Lottie Dawson, MD Referring: Burnis Medin, MD Visit Date: 10/22/2016 Occupation: '@GUAROCC'$ @    Subjective:  Dry mouth and dry eyes   History of Present Illness: Margaret Howard is a 51 y.o. female with history of Sjogren's and osteoarthritis. She continues to have dry mouth and dry eyes. She tried pilocarpine but did not like the side effects and discontinued the medication. She was trying to take it 3 times a day. She continues to have some discomfort in her hands and knee joints. She has right third trigger finger .The neck and lower back pain persist. She also has generalized pain from fibromyalgia.  Activities of Daily Living:  Patient reports morning stiffness for 60 minutes.   Patient Reports nocturnal pain.  Difficulty dressing/grooming: Denies Difficulty climbing stairs: Denies Difficulty getting out of chair: Denies Difficulty using hands for taps, buttons, cutlery, and/or writing: Reports   Review of Systems  Constitutional: Positive for fatigue. Negative for night sweats, weight gain, weight loss and weakness.  HENT: Positive for mouth dryness. Negative for mouth sores, trouble swallowing, trouble swallowing and nose dryness.   Eyes: Positive for dryness. Negative for pain, redness and visual disturbance.  Respiratory: Negative for cough, shortness of breath and difficulty breathing.   Cardiovascular: Positive for palpitations. Negative for chest pain, hypertension, irregular heartbeat and swelling in legs/feet.  Gastrointestinal: Negative for blood in stool, constipation and diarrhea.  Endocrine: Negative for increased urination.  Genitourinary: Negative for vaginal dryness.  Musculoskeletal: Positive for arthralgias, joint pain, myalgias, morning stiffness and myalgias. Negative for joint swelling, muscle weakness and muscle tenderness.    Skin: Positive for color change. Negative for rash, hair loss, skin tightness, ulcers and sensitivity to sunlight.  Allergic/Immunologic: Negative for susceptible to infections.  Neurological: Negative for dizziness, memory loss and night sweats.  Hematological: Negative for swollen glands.  Psychiatric/Behavioral: Positive for sleep disturbance. Negative for depressed mood. The patient is not nervous/anxious.     PMFS History:  Patient Active Problem List   Diagnosis Date Noted  . Primary osteoarthritis of both hands 09/13/2016  . Primary osteoarthritis of both knees 09/13/2016  . DJD (degenerative joint disease), cervical 09/13/2016  . DDD lumbar spine 09/13/2016  . Abnormal laboratory test 09/13/2016  . Atherosclerosis of native coronary artery of native heart without angina pectoris 12/21/2014  . History of degenerative disc disease 09/20/2014  . S/P coronary artery stent placement 08/29/2014  . Medication management 09/28/2013  . Weight gain 09/28/2013  . Osteopenia 08/11/2012  . Contusion 10/20/2011  . Other and unspecified hyperlipidemia 09/19/2011  . Dyslipidemia 07/23/2011  . CAD (coronary artery disease) 06/27/2011  . Chest pain 06/04/2011  . Arthritis 01/15/2011  . Amenorrhea 01/15/2011  . Swelling of ankle 11/23/2010  . Anxiety reaction 11/23/2010  . Positive ANA (antinuclear antibody) 11/23/2010  . DJD (degenerative joint disease), thoracolumbar 10/22/2010  . Lipoma of thigh 10/22/2010  . Incidental pulmonary nodule, greater than or equal to 66m 09/20/2010  . ABDOMINAL PAIN RIGHT UPPER QUADRANT 09/11/2010  . Dysphagia 09/03/2010  . OTHER DYSPHAGIA 09/03/2010  . RECTAL PAIN 07/16/2010  . PELVIC  PAIN 07/16/2010  . DELAYED MENSES 05/31/2010  . MUSCLE SPASM, TRAPEZIUS MUSCLE, RIGHT 05/14/2010  . MASS, LEFT  AXILLA 07/07/2009  . TOBACCO USE, QUIT 04/25/2009  . CONSTIPATION 02/22/2009  . LUMBAR RADICULOPATHY, LEFT 07/29/2008  . ADJ DISORDER WITH MIXED ANXIETY &  DEPRESSED MOOD 10/19/2007  . Essential hypertension 02/24/2007  . Pain in joint 02/18/2007  . FIBROMYALGIA 02/18/2007  . ADVEF, DRUG/MED/BIOL SUBST, OTHER DRUG NOS 01/21/2007    Past Medical History:  Diagnosis Date  . Arthritis    spine, feet, hands, knees  . CAD (coronary artery disease)    LHC 06/27/11: mLAD 50-60%, ostial septal perf 50%, mRCA occluded with L-R collats, EF 55% with inf HK.  She underwent FFR of he LAD (0.87 after adenosine) and this was not hemodynamically significant  . Fall 10/20/2011  . Fibromyalgia   . GERD (gastroesophageal reflux disease)   . Heart attack ([place]) 2012   1 stent  . Hypertension    has been off med. > 6 mos.  . Lipoma of thigh    left  . Positive ANA (antinuclear antibody)     Family History  Problem Relation Age of Onset  . Melanoma Mother   . Breast cancer Mother 66  . Hypertension Mother   . Melanoma Father   . Other Father     oral  . Osteoporosis Father     hx of hip fracture and died from med problems after this.from MI  . Coronary artery disease Father 47  . Cancer Father     oral, lived 17 years  . Breast cancer Maternal Aunt     early 6's  . Colon cancer Paternal Aunt   . Cancer Paternal Aunt     ovarian  . Ovarian cancer Paternal Aunt   . Colon cancer Paternal Aunt   . Colon cancer Cousin 50  . Colon cancer Cousin 54   Past Surgical History:  Procedure Laterality Date  . ANKLE FRACTURE SURGERY Left   . ANTERIOR CERVICAL DISCECTOMY  12/12/2010   and fusion C5-6, C6-7  . BREAST SURGERY  12/11   MRI guided breast biopsy  . CAROTID STENT  08/26/11   [facility]  . CARPAL TUNNEL RELEASE  06/29/2008; 08/2009   right; left  . COLONOSCOPY    . ESOPHAGOGASTRODUODENOSCOPY (EGD) WITH PROPOFOL N/A 08/10/2015   Procedure: ESOPHAGOGASTRODUODENOSCOPY (EGD) WITH PROPOFOL;  Surgeon: Milus Banister, MD;  Location: [facility] ENDOSCOPY;  Service: Endoscopy;  Laterality: N/A;  WITH BRAVO PROBE ON MEDICINE  . FOOT SURGERY     Right, fracture  repair  . FRACTIONAL FLOW RESERVE WIRE N/A 06/27/2011   Procedure: FRACTIONAL FLOW RESERVE WIRE;  Surgeon: Burnell Blanks, MD;  Location: [facility] CATH LAB;  Service: Cardiovascular;  Laterality: N/A;  . left foot  06/29/2008   scar tissue left foot  . LIPOMA EXCISION  12/13/2008   bilat. upper extremity masses  . [place]   microdisecetomy and laminectomy L5-S1   Social History   Social History Narrative   Occupation: Consulting civil engineer pre [employer]   HH of 6  Now 2  Children out of home  [place] [employer] [place]   4 dogs, cat, bird, frog and mouse   G3P3   Has a grandchild      [place]     Objective: Vital Signs: BP 103/68   Pulse 71   Resp 14   Ht '5\' 1"'$  (1.549 m)   LMP 08/25/2010    Physical Exam  Constitutional: She is oriented to person, place, and time. She appears well-developed and well-nourished.  HENT:  Head: Normocephalic and  atraumatic.  Eyes: Conjunctivae and EOM are normal.  Neck: Normal range of motion.  Cardiovascular: Normal rate, regular rhythm, normal heart sounds and intact distal pulses.   Pulmonary/Chest: Effort normal and breath sounds normal.  Abdominal: Soft. Bowel sounds are normal.  Lymphadenopathy:    She has no cervical adenopathy.  Neurological: She is alert and oriented to person, place, and time.  Skin: Skin is warm and dry. Capillary refill takes less than 2 seconds.  Psychiatric: She has a normal mood and affect. Her behavior is normal.  Nursing note and vitals reviewed.    Musculoskeletal Exam: C-spine she has limited lateral rotation. Thoracic and lumbar spine fairly good range of motion. Shoulder joints elbow joints wrist joints are good range of motion. She DIP PIP thickening of her hands consistent with osteoarthritis no synovitis was noted. She has right third trigger finger. Hip joints knee joints are good range of motion. She has chronic changes in her left ankle due to prior injury.  CDAI Exam: No CDAI exam completed.     Investigation: Findings:  09/13/2016 CBC normal, CMP normal, UA negative, ESR 12, RF negative, vitamin D 35, ANA 1:160 homogeneous, ENA negative, beta 2 negative, anticardiolipin negative, lupus anticoagulant negative, C3-C4 normal, immunoglobulins normal, IFE abnormal**  DEXA 10/09/2016 T score -1.3 left. Femoral neck BMD 0.700 Z score -0.40    Imaging: No results found.  Speciality Comments: No specialty comments available.    Procedures:  No procedures performed Allergies: Patient has no known allergies.   Assessment / Plan:     Visit Diagnoses: Sjogren's syndrome with keratoconjunctivitis sicca (HCC) - ANA 1:160 homogeneous, ENA negative. She continues to have sicca symptoms. She tried pilocarpine but discontinued as she had intolerance to that. We discussed trying low-dose pilocarpine maybe half a tablet and see if she tolerates that. Over-the-counter products were also discussed at length. I reviewed her lab work with her as well.  Primary osteoarthritis of both hands: Joint protection and muscle strengthening discussed.  Trigger middle finger of right hand: I discussed the option of some cortisone injection but she declined. She will try Voltaren gel and splinting of finger for right now. If her symptoms get worse she is supposed to notify me.  Primary osteoarthritis of both knees: Weight loss diet and muscle strengthening was discussed.  DJD (degenerative joint disease), cervical: She has limited range of motion and chronic discomfort  DDD lumbar spine: She is chronic pain  Osteopenia of multiple sites - T score -1.3 on DEXA 10 in April 2018. Use of calcium, vitamin D and resistive exercises were discussed. We will repeat bone density in 2 years again.  Abnormal laboratory test - Abnormal IFE. The findings were discussed with patient at length. She would prefer to have a rheumatology referral.  Fibromyalgia: She does experience some generalized pain and discomfort  from fibromyalgia  S/P coronary artery stent placement. She states she's been recently having some palpitations and will see her cardiologist.  Dyslipidemia  Anxiety reaction    Orders: No orders of the defined types were placed in this encounter.  No orders of the defined types were placed in this encounter.   Face-to-face time spent with patient was 30 minutes. 50% of time was spent in counseling and coordination of care.  Follow-Up Instructions: Return in about 6 months (around 04/23/2017) for Sjogren's.   Bo Merino, MD  Note - This record has been created using Editor, commissioning.  Chart creation errors have been sought, but may not  always  have been located. Such creation errors do not reflect on  the standard of medical care.

## 2016-10-17 ENCOUNTER — Telehealth: Payer: Self-pay | Admitting: *Deleted

## 2016-10-17 NOTE — Telephone Encounter (Signed)
Patient advised bone density scan shows Osteopenia. Advised recommendations are Calcium with Vitamin D and weight bearing exercises.

## 2016-10-18 ENCOUNTER — Other Ambulatory Visit: Payer: Self-pay | Admitting: Rheumatology

## 2016-10-18 NOTE — Telephone Encounter (Signed)
09/13/16 last visit  10/22/16 next visit  Ok to refill per Dr Estanislado Pandy

## 2016-10-22 ENCOUNTER — Encounter: Payer: Self-pay | Admitting: Rheumatology

## 2016-10-22 ENCOUNTER — Ambulatory Visit (INDEPENDENT_AMBULATORY_CARE_PROVIDER_SITE_OTHER): Payer: BLUE CROSS/BLUE SHIELD | Admitting: Rheumatology

## 2016-10-22 ENCOUNTER — Other Ambulatory Visit (INDEPENDENT_AMBULATORY_CARE_PROVIDER_SITE_OTHER): Payer: Self-pay | Admitting: *Deleted

## 2016-10-22 VITALS — BP 103/68 | HR 71 | Resp 14 | Ht 61.0 in

## 2016-10-22 DIAGNOSIS — M797 Fibromyalgia: Secondary | ICD-10-CM | POA: Diagnosis not present

## 2016-10-22 DIAGNOSIS — M8589 Other specified disorders of bone density and structure, multiple sites: Secondary | ICD-10-CM

## 2016-10-22 DIAGNOSIS — M5030 Other cervical disc degeneration, unspecified cervical region: Secondary | ICD-10-CM | POA: Diagnosis not present

## 2016-10-22 DIAGNOSIS — M170 Bilateral primary osteoarthritis of knee: Secondary | ICD-10-CM

## 2016-10-22 DIAGNOSIS — M47816 Spondylosis without myelopathy or radiculopathy, lumbar region: Secondary | ICD-10-CM

## 2016-10-22 DIAGNOSIS — M19041 Primary osteoarthritis, right hand: Secondary | ICD-10-CM | POA: Diagnosis not present

## 2016-10-22 DIAGNOSIS — R899 Unspecified abnormal finding in specimens from other organs, systems and tissues: Secondary | ICD-10-CM | POA: Diagnosis not present

## 2016-10-22 DIAGNOSIS — M65331 Trigger finger, right middle finger: Secondary | ICD-10-CM | POA: Diagnosis not present

## 2016-10-22 DIAGNOSIS — M47812 Spondylosis without myelopathy or radiculopathy, cervical region: Secondary | ICD-10-CM

## 2016-10-22 DIAGNOSIS — M19042 Primary osteoarthritis, left hand: Secondary | ICD-10-CM

## 2016-10-22 DIAGNOSIS — M3501 Sicca syndrome with keratoconjunctivitis: Secondary | ICD-10-CM | POA: Diagnosis not present

## 2016-10-22 DIAGNOSIS — Z955 Presence of coronary angioplasty implant and graft: Secondary | ICD-10-CM | POA: Diagnosis not present

## 2016-10-22 DIAGNOSIS — E785 Hyperlipidemia, unspecified: Secondary | ICD-10-CM

## 2016-10-22 DIAGNOSIS — F411 Generalized anxiety disorder: Secondary | ICD-10-CM | POA: Diagnosis not present

## 2016-10-23 ENCOUNTER — Telehealth: Payer: Self-pay | Admitting: Radiology

## 2016-10-23 NOTE — Telephone Encounter (Signed)
Patient has had DEXA study shows Osteopenia, I have called patient to advise, have sent for scanning Dr D states Ca Vit D and wt bearing exercises. Patient voiced understanding.

## 2016-11-05 ENCOUNTER — Encounter: Payer: Self-pay | Admitting: Hematology

## 2016-11-05 ENCOUNTER — Telehealth: Payer: Self-pay | Admitting: Hematology

## 2016-11-05 NOTE — Telephone Encounter (Signed)
Received a call from Ivin Booty at Dr. Arlean Hopping office to schedule the pt an appt. Appt has been schedueld for the pt to see Dr. Irene Limbo on 6/7 at 11am. Pt cld to confirm appt. Letter mailed.

## 2016-11-06 ENCOUNTER — Ambulatory Visit
Admission: RE | Admit: 2016-11-06 | Discharge: 2016-11-06 | Disposition: A | Discharge status: Home / Self Care | Payer: BLUE CROSS/BLUE SHIELD | Source: Ambulatory Visit | Attending: Obstetrics & Gynecology | Admitting: Obstetrics & Gynecology

## 2016-11-06 DIAGNOSIS — Z1231 Encounter for screening mammogram for malignant neoplasm of breast: Secondary | ICD-10-CM

## 2016-11-12 DIAGNOSIS — I2510 Atherosclerotic heart disease of native coronary artery without angina pectoris: Secondary | ICD-10-CM | POA: Diagnosis not present

## 2016-11-12 DIAGNOSIS — E782 Mixed hyperlipidemia: Secondary | ICD-10-CM | POA: Diagnosis not present

## 2016-11-12 DIAGNOSIS — R002 Palpitations: Secondary | ICD-10-CM | POA: Diagnosis not present

## 2016-11-12 DIAGNOSIS — I10 Essential (primary) hypertension: Secondary | ICD-10-CM | POA: Diagnosis not present

## 2016-11-15 ENCOUNTER — Other Ambulatory Visit: Payer: Self-pay | Admitting: Rheumatology

## 2016-11-15 NOTE — Telephone Encounter (Signed)
Last Visit: 10/22/16 Next Visit: 04/24/17  Okay to refill per Dr. Estanislado Pandy

## 2016-11-20 DIAGNOSIS — R002 Palpitations: Secondary | ICD-10-CM | POA: Diagnosis not present

## 2016-11-21 ENCOUNTER — Encounter: Payer: Self-pay | Admitting: Family Medicine

## 2016-11-21 ENCOUNTER — Ambulatory Visit (INDEPENDENT_AMBULATORY_CARE_PROVIDER_SITE_OTHER): Payer: BLUE CROSS/BLUE SHIELD | Admitting: Family Medicine

## 2016-11-21 VITALS — BP 110/72 | HR 84 | Temp 97.6°F | Wt 171.2 lb

## 2016-11-21 DIAGNOSIS — R300 Dysuria: Secondary | ICD-10-CM

## 2016-11-21 DIAGNOSIS — R350 Frequency of micturition: Secondary | ICD-10-CM

## 2016-11-21 LAB — POC URINALSYSI DIPSTICK (AUTOMATED)
Bilirubin, UA: NEGATIVE
Blood, UA: NEGATIVE
Glucose, UA: NEGATIVE
Ketones, UA: NEGATIVE
NITRITE UA: NEGATIVE
PROTEIN UA: NEGATIVE
SPEC GRAV UA: 1.01 (ref 1.010–1.025)
UROBILINOGEN UA: 0.2 U/dL
pH, UA: 6 (ref 5.0–8.0)

## 2016-11-21 MED ORDER — AMOXICILLIN-POT CLAVULANATE 875-125 MG PO TABS: 1.0000 | ORAL_TABLET | Freq: Two times a day (BID) | ORAL | 0 refills | Status: DC

## 2016-11-21 NOTE — Patient Instructions (Addendum)
Please take medication as directed and follow up if symptoms do not improve with treatment, worsen, or you develop a fever >100.   Urinary Tract Infection, Adult A urinary tract infection (UTI) is an infection of any part of the urinary tract. The urinary tract includes the:  Kidneys.  Ureters.  Bladder.  Urethra. These organs make, store, and get rid of pee (urine) in the body. Follow these instructions at home:  Take over-the-counter and prescription medicines only as told by your doctor.  If you were prescribed an antibiotic medicine, take it as told by your doctor. Do not stop taking the antibiotic even if you start to feel better.  Avoid the following drinks:  Alcohol.  Caffeine.  Tea.  Carbonated drinks.  Drink enough fluid to keep your pee clear or pale yellow.  Keep all follow-up visits as told by your doctor. This is important.  Make sure to:  Empty your bladder often and completely. Do not to hold pee for long periods of time.  Empty your bladder before and after sex.  Wipe from front to back after a bowel movement if you are female. Use each tissue one time when you wipe. Contact a doctor if:  You have back pain.  You have a fever.  You feel sick to your stomach (nauseous).  You throw up (vomit).  Your symptoms do not get better after 3 days.  Your symptoms go away and then come back. Get help right away if:  You have very bad back pain.  You have very bad lower belly (abdominal) pain.  You are throwing up and cannot keep down any medicines or water. This information is not intended to replace advice given to you by your health care provider. Make sure you discuss any questions you have with your health care provider. Document Released: 12/04/2007 Document Revised: 11/23/2015 Document Reviewed: 05/08/2015 Elsevier Interactive Patient Education  2017 [place] NOW OFFER   [facility] FAST TRACK!!!  SAME DAY  Appointments for ACUTE CARE  Such as: Sprains, Injuries, cuts, abrasions, rashes, muscle pain, joint pain, back pain Colds, flu, sore throats, headache, allergies, cough, fever  Ear pain, sinus and eye infections Abdominal pain, nausea, vomiting, diarrhea, upset stomach Animal/insect bites  3 Easy Ways to Schedule: Walk-In Scheduling Call in scheduling Mychart Sign-up: [email or web address]

## 2016-11-21 NOTE — Progress Notes (Signed)
Patient ID: Margaret Howard, female   DOB: 08/29/1965, 51 y.o.   MRN: [id number]    PCP: Burnis Medin, MD  Subjective:  Margaret Howard is a 51 y.o. year old very pleasant female patient who presents with Urinary Tract symptoms: symptoms including dysuria,  nocturia, and urgency. -started: one day ago, symptoms are not improving -previous treatments: None -Prior history of UTI 3 months ago that was noted after urine culture obtained.  No trigger noted for symptoms. No alleviating or aggravating factors noted  ROS-denies fever, chills, sweats, N/V, flank pain, vaginal discharge, or blood in urine  Pertinent Past Medical History- Urine culture 08/20/16 indicated Group B strep which was treated with Amoxicillin and resolved.  Medications- reviewed  Current Outpatient Prescriptions  Medication Sig Dispense Refill  . aspirin 81 MG tablet Take 81 mg by mouth daily.    . carvedilol (COREG) 6.25 MG tablet Take 6.25 mg by mouth 2 (two) times daily with a meal.    . clopidogrel (PLAVIX) 75 MG tablet Take 75 mg by mouth daily. Dr. Myriam Jacobson    . CRESTOR 20 MG tablet TAKE 1 TABELT BY MOUTH AT BEDTIME 90 tablet 3  . methocarbamol (ROBAXIN) 500 MG tablet Take 1-2 tablets (500-1,000 mg total) by mouth every 6 (six) hours as needed for muscle spasms. 40 tablet 1  . nitroGLYCERIN (NITROSTAT) 0.4 MG SL tablet Place 0.4 mg under the tongue every 5 (five) minutes as needed for chest pain.     . pantoprazole (PROTONIX) 40 MG tablet TAKE 1 TABLET (40 MG TOTAL) BY MOUTH 2 (TWO) TIMES DAILY BEFORE A MEAL. 180 tablet 3  . pilocarpine (SALAGEN) 5 MG tablet TAKE 1 TABLET (5 MG TOTAL) BY MOUTH 3 (THREE) TIMES DAILY AS NEEDED. 90 tablet 0  . ranitidine (ZANTAC) 150 MG tablet Take 150 mg by mouth at bedtime.  12  . triamcinolone (KENALOG) 0.1 % paste APPLY TO AFFECTED AREA 4 TIMES A DAY AFTER MEALS AND AT BEDTIME  12  . valsartan (DIOVAN) 80 MG tablet TAKE 1 TABLET BY MOUTH ONCE DAILY 90 tablet 2   No current  facility-administered medications for this visit.     Objective: BP 110/72 (BP Location: Left Arm, Patient Position: Sitting, Cuff Size: Normal)   Pulse 84   Temp 97.6 F (36.4 C) (Oral)   Wt 171 lb 3.2 oz (77.7 kg)   LMP 08/25/2010   SpO2 98%   BMI 32.35 kg/m  Gen: NAD, resting comfortably HEENT: Turbinates erythematous,  pharynx normal with no tonsilar exudate or edema CV: RRR no murmurs rubs or gallops Lungs: CTAB no crackles, wheeze, rhonchi Abdomen: soft/nontender/nondistended/normal bowel sounds. No rebound or guarding.  No CVA tenderness.   Suprapubic tenderness present Ext: no edema Skin: warm, dry, no rash Neuro: grossly normal, moves all extremities  Assessment/Plan:  Dysuria UA indicates 1+ Leukocytes History and exam today are suggestive of UTI.  Will send for culture as previous episode of similar symptoms was identified through culture. Empiric treatment with augmentin for 5 days due to previous history.  Advised patient to complete antibiotic and she should follow up if her symptoms do not improve in 2 to 3 days, worsen, she develops a fever >101, or back pain. Also advised her to force fluids. Patient voiced understanding and agreed with plan.  Finally, we reviewed reasons to return to care including if symptoms worsen or persist or new concerns arise- once again particularly fever, N/V, or flank pain.  Laurita Quint, FNP

## 2016-11-22 ENCOUNTER — Ambulatory Visit: Payer: BLUE CROSS/BLUE SHIELD | Admitting: Internal Medicine

## 2016-11-23 LAB — URINE CULTURE

## 2016-12-04 DIAGNOSIS — R05 Cough: Secondary | ICD-10-CM | POA: Diagnosis not present

## 2016-12-04 DIAGNOSIS — J019 Acute sinusitis, unspecified: Secondary | ICD-10-CM | POA: Diagnosis not present

## 2016-12-04 DIAGNOSIS — M546 Pain in thoracic spine: Secondary | ICD-10-CM | POA: Diagnosis not present

## 2016-12-05 ENCOUNTER — Ambulatory Visit (HOSPITAL_BASED_OUTPATIENT_CLINIC_OR_DEPARTMENT_OTHER): Payer: BLUE CROSS/BLUE SHIELD | Admitting: Hematology

## 2016-12-05 ENCOUNTER — Encounter: Payer: Self-pay | Admitting: Hematology

## 2016-12-05 ENCOUNTER — Telehealth: Payer: Self-pay | Admitting: Hematology

## 2016-12-05 ENCOUNTER — Ambulatory Visit (HOSPITAL_BASED_OUTPATIENT_CLINIC_OR_DEPARTMENT_OTHER): Payer: BLUE CROSS/BLUE SHIELD

## 2016-12-05 VITALS — BP 124/77 | HR 77 | Temp 98.5°F | Resp 18 | Ht 61.0 in | Wt 172.0 lb

## 2016-12-05 DIAGNOSIS — D472 Monoclonal gammopathy: Secondary | ICD-10-CM

## 2016-12-05 DIAGNOSIS — Z803 Family history of malignant neoplasm of breast: Secondary | ICD-10-CM

## 2016-12-05 DIAGNOSIS — I10 Essential (primary) hypertension: Secondary | ICD-10-CM

## 2016-12-05 LAB — CBC & DIFF AND RETIC
BASO%: 0.1 % (ref 0.0–2.0)
BASOS ABS: 0 10*3/uL (ref 0.0–0.1)
EOS%: 0.5 % (ref 0.0–7.0)
Eosinophils Absolute: 0.1 10*3/uL (ref 0.0–0.5)
HCT: 41.4 % (ref 34.8–46.6)
HGB: 14.2 g/dL (ref 11.6–15.9)
Immature Retic Fract: 5 % (ref 1.60–10.00)
LYMPH#: 0.8 10*3/uL — AB (ref 0.9–3.3)
LYMPH%: 8.6 % — ABNORMAL LOW (ref 14.0–49.7)
MCH: 32.4 pg (ref 25.1–34.0)
MCHC: 34.3 g/dL (ref 31.5–36.0)
MCV: 94.5 fL (ref 79.5–101.0)
MONO#: 0.2 10*3/uL (ref 0.1–0.9)
MONO%: 2.4 % (ref 0.0–14.0)
NEUT%: 88.4 % — ABNORMAL HIGH (ref 38.4–76.8)
NEUTROS ABS: 8.5 10*3/uL — AB (ref 1.5–6.5)
Platelets: 220 10*3/uL (ref 145–400)
RBC: 4.38 10*6/uL (ref 3.70–5.45)
RDW: 12.4 % (ref 11.2–14.5)
RETIC %: 2.03 % (ref 0.70–2.10)
RETIC CT ABS: 88.91 10*3/uL (ref 33.70–90.70)
WBC: 9.7 10*3/uL (ref 3.9–10.3)

## 2016-12-05 LAB — COMPREHENSIVE METABOLIC PANEL
ALT: 19 U/L (ref 0–55)
AST: 17 U/L (ref 5–34)
Albumin: 3.9 g/dL (ref 3.5–5.0)
Alkaline Phosphatase: 101 U/L (ref 40–150)
Anion Gap: 8 mEq/L (ref 3–11)
BUN: 13.1 mg/dL (ref 7.0–26.0)
CHLORIDE: 107 meq/L (ref 98–109)
CO2: 26 meq/L (ref 22–29)
CREATININE: 0.9 mg/dL (ref 0.6–1.1)
Calcium: 9.9 mg/dL (ref 8.4–10.4)
EGFR: 78 mL/min/{1.73_m2} — ABNORMAL LOW (ref >90)
Glucose: 125 mg/dl (ref 70–140)
Potassium: 4.2 mEq/L (ref 3.5–5.1)
SODIUM: 140 meq/L (ref 136–145)
Total Bilirubin: 0.58 mg/dL (ref 0.20–1.20)
Total Protein: 7.3 g/dL (ref 6.4–8.3)

## 2016-12-05 LAB — LACTATE DEHYDROGENASE: LDH: 211 U/L (ref 125–245)

## 2016-12-05 NOTE — Telephone Encounter (Signed)
Gave patient avs report and appointments for December  °

## 2016-12-05 NOTE — Patient Instructions (Signed)
Thank you for choosing [facility] to provide your oncology and hematology care.  To afford each patient quality time with our providers, please arrive 30 minutes before your scheduled appointment time.  If you arrive late for your appointment, you may be asked to reschedule.  We strive to give you quality time with our providers, and arriving late affects you and other patients whose appointments are after yours.   If you are a no show for multiple scheduled visits, you may be dismissed from the clinic at the providers discretion.    Again, thank you for choosing [facility], our hope is that these requests will decrease the amount of time that you wait before being seen by our physicians.  ______________________________________________________________________  Should you have questions after your visit to the [facility], please contact our office at [phone] between the hours of 8:30 and 4:30 p.m.    Voicemails left after 4:30p.m will not be returned until the following business day.    For prescription refill requests, please have your pharmacy contact us directly.  Please also try to allow 48 hours for prescription requests.    Please contact the scheduling department for questions regarding scheduling.  For scheduling of procedures such as PET scans, CT scans, MRI, Ultrasound, etc please contact central scheduling at [phone].    Resources For Cancer Patients and Caregivers:   Oncolink.org:  A wonderful resource for patients and healthcare providers for information regarding your disease, ways to tract your treatment, what to expect, etc.     American Cancer Society:  800-227-2345  Can help patients locate various types of support and financial assistance  Cancer Care: 1-800-813-HOPE (4673) Provides financial assistance, online support groups, medication/co-pay assistance.    [place] DSS:  336-641-3447 Where to apply for food  stamps, Medicaid, and utility assistance  Medicare Rights Center: 800-333-4114 Helps people with Medicare understand their rights and benefits, navigate the Medicare system, and secure the quality healthcare they deserve  SCAT: 336-333-6589 [place] [employer]'s shared-ride transportation service for eligible riders who have a disability that prevents them from riding the fixed route bus.    For additional information on assistance programs please contact our social worker:   Grier Hock/Abigail Elmore:  [phone]            

## 2016-12-05 NOTE — Progress Notes (Signed)
Marland Kitchen    HEMATOLOGY/ONCOLOGY CONSULTATION NOTE  Date of Service: 12/05/2016  Patient Care Team: Burnis Medin, MD as PCP - General Milus Banister, MD (Gastroenterology) Susa Day, MD (Orthopedic Surgery) Melina Schools, MD (Orthopedic Surgery) Minus Breeding, MD (Cardiology) Myriam Jacobson as Consulting Physician (Cardiology) Bo Merino, MD (Rheumatology) Emi Holes, MD as Referring Physician (Cardiothoracic Surgery) Leta Baptist, MD as Consulting Physician (Otolaryngology)  CHIEF COMPLAINTS/PURPOSE OF CONSULTATION:  Abnormal IFE  HISTORY OF PRESENTING ILLNESS:   Margaret Howard is a wonderful 51 y.o. female who has been referred to [place] by Dr .Regis Bill, [name], MD /Dr Estanislado Pandy MD  for evaluation and management of possible monoclonal paraproteinemia.  Patient has a history of hypertension, coronary artery disease status post PCI, Sjogren syndrome with keratoconjunctivitis sicca , GERD, osteoarthritis, fibromyalgia was seen by a rheumatologist Dr. Estanislado Pandy and has initial workup for her joint pains had an SPEP on 09/13/2016 that showed no overt M spike but IFE showed a poorly defined area of restricted protein mobility reactive to IgM and kappa antisera suggestive of possible IgM kappa monoclonal gammopathy .  She was referred to [place] for further evaluation . Patient has some generalized body aches from her fibromyalgia . Continues to have significant sicca and chronic dry cough symptoms .has has been on prednisone for her rheumatologic issues . No fevers no chills no night sweats no weight loss. No focal spine or skull pain.  MEDICAL HISTORY:  Past Medical History:  Diagnosis Date  . Arthritis    spine, feet, hands, knees  . CAD (coronary artery disease)    LHC 06/27/11: mLAD 50-60%, ostial septal perf 50%, mRCA occluded with L-R collats, EF 55% with inf HK.  She underwent FFR of he LAD (0.87 after adenosine) and this was not hemodynamically significant  . Fall  10/20/2011  . Fibromyalgia   . GERD (gastroesophageal reflux disease)   . Heart attack ([place]) 2012   1 stent  . Hypertension    has been off med. > 6 mos.  . Lipoma of thigh    left  . Positive ANA (antinuclear antibody)   Sjogren syndrome with  keratoconjunctivitis sicca  SURGICAL HISTORY: Past Surgical History:  Procedure Laterality Date  . ANKLE FRACTURE SURGERY Left   . ANTERIOR CERVICAL DISCECTOMY  12/12/2010   and fusion C5-6, C6-7  . BREAST SURGERY  12/11   MRI guided breast biopsy  . CAROTID STENT  08/26/11   [facility]  . CARPAL TUNNEL RELEASE  06/29/2008; 08/2009   right; left  . COLONOSCOPY    . ESOPHAGOGASTRODUODENOSCOPY (EGD) WITH PROPOFOL N/A 08/10/2015   Procedure: ESOPHAGOGASTRODUODENOSCOPY (EGD) WITH PROPOFOL;  Surgeon: Milus Banister, MD;  Location: [facility];  Service: Endoscopy;  Laterality: N/A;  WITH BRAVO PROBE ON MEDICINE  . FOOT SURGERY     Right, fracture repair  . FRACTIONAL FLOW RESERVE WIRE N/A 06/27/2011   Procedure: FRACTIONAL FLOW RESERVE WIRE;  Surgeon: Burnell Blanks, MD;  Location: [facility] CATH LAB;  Service: Cardiovascular;  Laterality: N/A;  . left foot  06/29/2008   scar tissue left foot  . LIPOMA EXCISION  12/13/2008   bilat. upper extremity masses  . [place]   microdisecetomy and laminectomy L5-S1    SOCIAL HISTORY: Social History   Social History  . Marital status: Married    Spouse name: N/A  . Number of children: 3  . Years of education: N/A   Occupational History  . pre-school teacher  Social History Main Topics  . Smoking status: Former Smoker    Packs/day: 1.00    Quit date: 03/07/1986  . Smokeless tobacco: Never Used  . Alcohol use 3.0 oz/week    5 Standard drinks or equivalent per week  . Drug use: No  . Sexual activity: Yes    Partners: Male    Birth control/ protection: Post-menopausal   Other Topics Concern  . Not on file   Social History Narrative   Occupation: Consulting civil engineer pre K   HH of  6  Now 2  Children out of home  [place] [employer] [place]   4 dogs, cat, bird, frog and mouse   G3P3   Has a grandchild      [place]    FAMILY HISTORY: Family History  Problem Relation Age of Onset  . Melanoma Mother   . Breast cancer Mother 108  . Hypertension Mother   . Melanoma Father   . Other Father        oral  . Osteoporosis Father        hx of hip fracture and died from med problems after this.from MI  . Coronary artery disease Father 55  . Cancer Father        oral, lived 69 years  . Breast cancer Maternal Aunt        early 1's  . Colon cancer Paternal Aunt   . Cancer Paternal Aunt        ovarian  . Ovarian cancer Paternal Aunt   . Colon cancer Paternal Aunt   . Colon cancer Cousin 78  . Colon cancer Cousin 46    ALLERGIES:  has No Known Allergies.  MEDICATIONS:  Current Outpatient Prescriptions  Medication Sig Dispense Refill  . aspirin 81 MG tablet Take 81 mg by mouth daily.    . carvedilol (COREG) 6.25 MG tablet Take 6.25 mg by mouth 2 (two) times daily with a meal.    . clopidogrel (PLAVIX) 75 MG tablet Take 75 mg by mouth daily. Dr. Myriam Jacobson    . CRESTOR 20 MG tablet TAKE 1 TABELT BY MOUTH AT BEDTIME 90 tablet 3  . methocarbamol (ROBAXIN) 500 MG tablet Take 1-2 tablets (500-1,000 mg total) by mouth every 6 (six) hours as needed for muscle spasms. 40 tablet 1  . nitroGLYCERIN (NITROSTAT) 0.4 MG SL tablet Place 0.4 mg under the tongue every 5 (five) minutes as needed for chest pain.     . pantoprazole (PROTONIX) 40 MG tablet TAKE 1 TABLET (40 MG TOTAL) BY MOUTH 2 (TWO) TIMES DAILY BEFORE A MEAL. 180 tablet 3  . ranitidine (ZANTAC) 150 MG tablet Take 150 mg by mouth at bedtime.  12  . triamcinolone (KENALOG) 0.1 % paste APPLY TO AFFECTED AREA 4 TIMES A DAY AFTER MEALS AND AT BEDTIME  12  . valsartan (DIOVAN) 80 MG tablet TAKE 1 TABLET BY MOUTH ONCE DAILY 90 tablet 2   No current facility-administered medications for this visit.     REVIEW OF  SYSTEMS:    10 Point review of Systems was done is negative except as noted above.  PHYSICAL EXAMINATION: ECOG PERFORMANCE STATUS: 1 - Symptomatic but completely ambulatory  . Vitals:   12/05/16 1049  BP: 124/77  Pulse: 77  Resp: 18  Temp: 98.5 F (36.9 C)   Filed Weights   12/05/16 1049  Weight: 172 lb (78 kg)   .Body mass index is 32.5 kg/m.  GENERAL:alert, in no acute distress and  comfortable SKIN: no acute rashes, no significant lesions EYES: conjunctiva are pink and non-injected, sclera anicteric OROPHARYNX: MMM, no exudates, no oropharyngeal erythema or ulceration NECK: supple, no JVD LYMPH:  no palpable lymphadenopathy in the cervical, axillary or inguinal regions LUNGS: clear to auscultation b/l with normal respiratory effort HEART: regular rate & rhythm ABDOMEN:  normoactive bowel sounds , non tender, not distended. Extremity: no pedal edema PSYCH: alert & oriented x 3 with fluent speech NEURO: no focal motor/sensory deficits  LABORATORY DATA:  I have reviewed the data as listed  . CBC Latest Ref Rng & Units 12/05/2016 09/13/2016 04/05/2016  WBC 3.9 - 10.3 10e3/uL 9.7 5.6 4.8  Hemoglobin 11.6 - 15.9 g/dL 14.2 14.4 13.7  Hematocrit 34.8 - 46.6 % 41.4 42.1 38.8  Platelets 145 - 400 10e3/uL 220 241 219.0    . CMP Latest Ref Rng & Units 12/05/2016 12/05/2016 09/13/2016  Glucose 70 - 140 mg/dl 125 - 95  BUN 7.0 - 26.0 mg/dL 13.1 - 10  Creatinine 0.6 - 1.1 mg/dL 0.9 - 0.66  Sodium 136 - 145 mEq/L 140 - 141  Potassium 3.5 - 5.1 mEq/L 4.2 - 4.2  Chloride 98 - 110 mmol/L - - 104  CO2 22 - 29 mEq/L 26 - 21  Calcium 8.4 - 10.4 mg/dL 9.9 - 9.4  Total Protein 6.0 - 8.5 g/dL 7.3 6.7 7.5  Total Bilirubin 0.20 - 1.20 mg/dL 0.58 - 0.8  Alkaline Phos 40 - 150 U/L 101 - 99  AST 5 - 34 U/L 17 - 20  ALT 0 - 55 U/L 19 - 20   Component     Latest Ref Rng & Units 12/05/2016 12/13/2016  IgG (Immunoglobin G), Serum     700 - 1,600 mg/dL 785   IgA/Immunoglobulin A, Serum     87  - 352 mg/dL 232   IgM, Qn, Serum     26 - 217 mg/dL 329 (H)   Total Protein     6.0 - 8.5 g/dL 6.7   Albumin SerPl Elph-Mcnc     2.9 - 4.4 g/dL 3.7   Alpha 1     0.0 - 0.4 g/dL 0.2   Alpha2 Glob SerPl Elph-Mcnc     0.4 - 1.0 g/dL 0.7   B-Globulin SerPl Elph-Mcnc     0.7 - 1.3 g/dL 1.0   Gamma Glob SerPl Elph-Mcnc     0.4 - 1.8 g/dL 1.1   M Protein SerPl Elph-Mcnc     Not Observed g/dL Not Observed   Globulin, Total     2.2 - 3.9 g/dL 3.0   Albumin/Glob SerPl     0.7 - 1.7 1.3   IFE 1      Comment   Please Note (HCV):      Comment   Protein Urine Random     Not Estab. mg/dL  8.8  Prot,24hr calculated     30 - 150 mg/24 hr  123  ALBUMIN, U     %  14.9  Alpha-1-Globulin, U     %  2.5  ALPHA-2-GLOBULIN, U     %  17.0  Beta Globulin, U     %  35.0  Gamma Globulin, U     %  30.5  M-spike, %     Not Observed %  Not Observed  Please Note:       Comment  Ig Kappa Free Light Chain     3.3 - 19.4 mg/L 14.8   Ig Lambda Free  Light Chain     5.7 - 26.3 mg/L 15.3   Kappa/Lambda FluidC Ratio     0.26 - 1.65 0.97   Sed Rate     0 - 40 mm/hr 15   LDH     125 - 245 U/L 211    Multiple Myeloma Panel (SPEP&IFE w/QIG)      No reference range information available      Resulting Lab: [facility]      Comments:           An apparent polyclonal gammopathy: IgM. Kappa and lambda            typing           appear increased.   RADIOGRAPHIC STUDIES: I have personally reviewed the radiological images as listed and agreed with the findings in the report. Mm Screening Breast Tomo Bilateral  Result Date: 11/06/2016 CLINICAL DATA:  Screening. EXAM: 2D DIGITAL SCREENING BILATERAL MAMMOGRAM WITH CAD AND ADJUNCT TOMO COMPARISON:  Previous exam(s). ACR Breast Density Category b: There are scattered areas of fibroglandular density. FINDINGS: There are no findings suspicious for malignancy. Images were processed with CAD. IMPRESSION: No mammographic evidence of malignancy. A result letter  of this screening mammogram will be mailed directly to the patient. RECOMMENDATION: Screening mammogram in one year. (Code:SM-B-01Y) BI-RADS CATEGORY  1: Negative. Electronically Signed   By: Trude Mcburney M.D.   On: 11/06/2016 15:46    ASSESSMENT & PLAN:   51 year old female with  #1 IgM kappa monoclonal protein on SPEP in March 2018.   This is likely transient and related to the patient's underlying Sjogren's disease causing chronic inflammation. Her myeloma panel was repeated and shows no M spike. IFE shows a polyclonal IgM kappa and lambda gammopathy likely from her underlying chronic inflammatory state. Serum free light chains showed no evidence of a clonal paraproteinemia. 24 hour UPEP is negative for M protein LDH level is within normal limits Sedimentation rate within normal limits. Patient has no anemia normal kidney function no hypercalcemia. CBC shows no cytopenias or other abnormalities.  Plan -Patient appears to have a reactive polyclonal gammopathy likely related to her chronic inflammatory state from her Sjogren's disease -no evidence of a monoclonal paraproteinemia or lymphoma at this time. -She is at increased risk of certain lymphomas and other malignancies in the setting of immune suppression and abnormal immunity related to her Sjogren's disease. -Continued follow-up with rheumatology and primary care physician. -No indication for bone marrow biopsy and whole body skeletal screening at this time -We'll plan to see her for an follow-up in 6 months with repeat labs . -Kindly consult [place] earlier if there is any change in clinical status, Lymphadenopathy or hepatosplenomegaly or other significant CBC abnormalities .   All of the patients questions were answered with apparent satisfaction. The patient knows to call the clinic with any problems, questions or concerns.  I spent 30 minutes counseling the patient face to face. The total time spent in the appointment was 45  minutes and more than 50% was on counseling and direct patient cares.    Sullivan Lone MD [place] AAHIVMS [facility] Hematology/Oncology Physician [facility]  (Office):       [phone] (Work cell):  [phone] (Fax):           [phone]  12/05/2016 11:16 AM

## 2016-12-06 LAB — KAPPA/LAMBDA LIGHT CHAINS
IG KAPPA FREE LIGHT CHAIN: 14.8 mg/L (ref 3.3–19.4)
IG LAMBDA FREE LIGHT CHAIN: 15.3 mg/L (ref 5.7–26.3)
KAPPA/LAMBDA FLC RATIO: 0.97 (ref 0.26–1.65)

## 2016-12-06 LAB — SEDIMENTATION RATE: Sedimentation Rate-Westergren: 15 mm/hr (ref 0–40)

## 2016-12-09 LAB — MULTIPLE MYELOMA PANEL, SERUM
Albumin SerPl Elph-Mcnc: 3.7 g/dL (ref 2.9–4.4)
Albumin/Glob SerPl: 1.3 (ref 0.7–1.7)
Alpha 1: 0.2 g/dL (ref 0.0–0.4)
Alpha2 Glob SerPl Elph-Mcnc: 0.7 g/dL (ref 0.4–1.0)
B-GLOBULIN SERPL ELPH-MCNC: 1 g/dL (ref 0.7–1.3)
Gamma Glob SerPl Elph-Mcnc: 1.1 g/dL (ref 0.4–1.8)
Globulin, Total: 3 g/dL (ref 2.2–3.9)
IgA, Qn, Serum: 232 mg/dL (ref 87–352)
IgG, Qn, Serum: 785 mg/dL (ref 700–1600)
IgM, Qn, Serum: 329 mg/dL — ABNORMAL HIGH (ref 26–217)
TOTAL PROTEIN: 6.7 g/dL (ref 6.0–8.5)

## 2016-12-11 NOTE — Progress Notes (Signed)
Chief Complaint  Patient presents with  . Cough    pain in right shoulder/ sx started last week     HPI: Margaret Howard 51 y.o.  sda   resp sx  Onset cough  Dry off and on holter  Only premature beats   Was ok  And osmtimes  Palpitations makes her cough and last week back   righ tupper shoulder  Pain  Down arem Went to  [facility] and given pred[facility]one   With some help and still there . Achy deep but arm is better wants to make sure her lung is ok and not causing her sx ,   No weakness  Other wise ok  Seeing rheum and hem  For poss MGUS  ROS: See pertinent positives and negatives per HPI.  Past Medical History:  Diagnosis Date  . Arthritis    spine, feet, hands, knees  . CAD (coronary artery disease)    LHC 06/27/11: mLAD 50-60%, ostial septal perf 50%, mRCA occluded with L-R collats, EF 55% with inf HK.  She underwent FFR of he LAD (0.87 after adenosine) and this was not hemodynamically significant  . Fall 10/20/2011  . Fibromyalgia   . GERD (gastroesophageal reflux disease)   . Heart attack ([place]) 2012   1 stent  . Hypertension    has been off med. > 6 mos.  . Lipoma of thigh    left  . Positive ANA (antinuclear antibody)     Family History  Problem Relation Age of Onset  . Melanoma Mother   . Breast cancer Mother 55  . Hypertension Mother   . Melanoma Father   . Other Father        oral  . Osteoporosis Father        hx of hip fracture and died from med problems after this.from MI  . Coronary artery disease Father 64  . Cancer Father        oral, lived 45 years  . Breast cancer Maternal Aunt        early 20's  . Colon cancer Paternal Aunt   . Cancer Paternal Aunt        ovarian  . Ovarian cancer Paternal Aunt   . Colon cancer Paternal Aunt   . Colon cancer Cousin 1  . Colon cancer Cousin 59    Social History   Social History  . Marital status: Married    Spouse name: N/A  . Number of children: 3  . Years of education: N/A   Occupational History  . pre-school  teacher    Social History Main Topics  . Smoking status: Former Smoker    Packs/day: 1.00    Quit date: 03/07/1986  . Smokeless tobacco: Never Used  . Alcohol use 3.0 oz/week    5 Standard drinks or equivalent per week  . Drug use: No  . Sexual activity: Yes    Partners: Male    Birth control/ protection: Post-menopausal   Other Topics Concern  . Not on file   Social History Narrative   Occupation: Consulting civil engineer pre K   HH of 6  Now 2  Children out of home  [place] [employer] [place]   4 dogs, cat, bird, frog and mouse   G3P3   Has a grandchild      [place]    Outpatient Medications Prior to Visit  Medication Sig Dispense Refill  . aspirin 81 MG tablet Take 81 mg by mouth daily.    Marland Kitchen  carvedilol (COREG) 6.25 MG tablet Take 6.25 mg by mouth 2 (two) times daily with a meal.    . clopidogrel (PLAVIX) 75 MG tablet Take 75 mg by mouth daily. Dr. Myriam Jacobson    . CRESTOR 20 MG tablet TAKE 1 TABELT BY MOUTH AT BEDTIME 90 tablet 3  . methocarbamol (ROBAXIN) 500 MG tablet Take 1-2 tablets (500-1,000 mg total) by mouth every 6 (six) hours as needed for muscle spasms. 40 tablet 1  . nitroGLYCERIN (NITROSTAT) 0.4 MG SL tablet Place 0.4 mg under the tongue every 5 (five) minutes as needed for chest pain.     . pantoprazole (PROTONIX) 40 MG tablet TAKE 1 TABLET (40 MG TOTAL) BY MOUTH 2 (TWO) TIMES DAILY BEFORE A MEAL. 180 tablet 3  . ranitidine (ZANTAC) 150 MG tablet Take 150 mg by mouth at bedtime.  12  . triamcinolone (KENALOG) 0.1 % paste APPLY TO AFFECTED AREA 4 TIMES A DAY AFTER MEALS AND AT BEDTIME  12  . valsartan (DIOVAN) 80 MG tablet TAKE 1 TABLET BY MOUTH ONCE DAILY 90 tablet 2   No facility-administered medications prior to visit.      EXAM:  BP 102/70 (BP Location: Right Arm, Patient Position: Sitting, Cuff Size: Normal)   Pulse 78   Temp 98.2 F (36.8 C) (Oral)   Wt 172 lb (78 kg)   LMP 08/25/2010   SpO2 98%   BMI 32.50 kg/m   Body mass index is 32.5  kg/m.  GENERAL: vitals reviewed and listed above, alert, oriented, appears well hydrated and in no acute distress HEENT: atraumatic, conjunctiva  clear, no obvious abnormalities on inspection of external nose and ears NECK: no obvious masses on inspection palpation  No adenopathy noted  LUNGS: clear to auscultation bilaterally, no wheezes, rales or rhonchi, good air movement locastion discomfort righ trapezious and below  No [place] nodes   CV: HRRR, no clubbing cyanosis or  peripheral edema nl cap refill  MS: moves all extremities without noticeable focal  abnormality PSYCH: pleasant and cooperative, no obvious depression or anxiety  ASSESSMENT AND PLAN:  Discussed the following assessment and plan:  Cough, persistent  Upper back pain on right side  S/P coronary artery stent placement prob ms cause  But agree with c xray  Exam ok otherwise   Order in the sytem already .  Fu with specilasit as needed looks health today -Patient advised to return or notify health care team  if symptoms worsen ,persist or new concerns arise.  Patient Instructions  Get the chest x ray .   Today    And let you know could be MS and  Spinal  Non surgical      Your exam is normal .     [name]. Dawaun Brancato M.D.

## 2016-12-12 ENCOUNTER — Encounter: Payer: Self-pay | Admitting: Internal Medicine

## 2016-12-12 ENCOUNTER — Ambulatory Visit (INDEPENDENT_AMBULATORY_CARE_PROVIDER_SITE_OTHER): Payer: BLUE CROSS/BLUE SHIELD | Admitting: Internal Medicine

## 2016-12-12 ENCOUNTER — Ambulatory Visit (INDEPENDENT_AMBULATORY_CARE_PROVIDER_SITE_OTHER)
Admission: RE | Admit: 2016-12-12 | Discharge: 2016-12-12 | Disposition: A | Discharge status: Home / Self Care | Payer: BLUE CROSS/BLUE SHIELD | Source: Ambulatory Visit | Attending: Internal Medicine | Admitting: Internal Medicine

## 2016-12-12 VITALS — BP 102/70 | HR 78 | Temp 98.2°F | Wt 172.0 lb

## 2016-12-12 DIAGNOSIS — M549 Dorsalgia, unspecified: Secondary | ICD-10-CM

## 2016-12-12 DIAGNOSIS — Z955 Presence of coronary angioplasty implant and graft: Secondary | ICD-10-CM

## 2016-12-12 DIAGNOSIS — R05 Cough: Secondary | ICD-10-CM

## 2016-12-12 DIAGNOSIS — R053 Chronic cough: Secondary | ICD-10-CM

## 2016-12-12 NOTE — Patient Instructions (Signed)
Get the chest x ray .   Today    And let you know could be MS and  Spinal  Non surgical      Your exam is normal .

## 2016-12-13 DIAGNOSIS — D472 Monoclonal gammopathy: Secondary | ICD-10-CM | POA: Diagnosis not present

## 2016-12-16 LAB — UPEP/TP, 24-HR URINE
ALPHA-2-GLOBULIN, U: 17 %
Albumin, U: 14.9 %
Alpha-1-Globulin, U: 2.5 %
Beta Globulin, U: 35 %
Gamma Globulin, U: 30.5 %
PROTEIN UR: 8.8 mg/dL
Prot,24hr calculated: 123 mg/24 hr (ref 30–150)

## 2017-01-03 ENCOUNTER — Other Ambulatory Visit: Payer: Self-pay | Admitting: Internal Medicine

## 2017-01-03 NOTE — Telephone Encounter (Signed)
Sent for 90 days.  Last filled 03/2016 for 9 months.  Pt due back for yearly Oct 18.  Should last until Oct.

## 2017-03-31 ENCOUNTER — Other Ambulatory Visit: Payer: Self-pay | Admitting: Internal Medicine

## 2017-03-31 NOTE — Telephone Encounter (Signed)
Please advise on refill request for Valsartan  Pt's last ov was for an acute complaint on 12/12/16. No pending OVs Last filled 01/03/17 qty:90 Rf:0

## 2017-04-02 NOTE — Telephone Encounter (Signed)
Ok to refill x 6 mos  She sees cardiology and rheumatology also

## 2017-04-09 ENCOUNTER — Ambulatory Visit (INDEPENDENT_AMBULATORY_CARE_PROVIDER_SITE_OTHER): Payer: BLUE CROSS/BLUE SHIELD | Admitting: *Deleted

## 2017-04-09 DIAGNOSIS — Z23 Encounter for immunization: Secondary | ICD-10-CM

## 2017-04-24 ENCOUNTER — Ambulatory Visit: Payer: BLUE CROSS/BLUE SHIELD | Admitting: Rheumatology

## 2017-05-10 ENCOUNTER — Other Ambulatory Visit: Payer: Self-pay | Admitting: Gastroenterology

## 2017-05-13 IMAGING — RF DG ESOPHAGUS
9 series · 19 of 24 positions shown · non-contrast
Comparison: None.

CLINICAL DATA: Dysphagia.  Solids gets stuck in throat.

EXAM:
ESOPHOGRAM / BARIUM SWALLOW / BARIUM TABLET STUDY
TECHNIQUE: Combined double contrast and single contrast examination performed
using effervescent crystals, thick barium liquid, and thin barium
liquid. The patient was observed with fluoroscopy swallowing a 13 mm
barium sulphate tablet.
FLUOROSCOPY TIME:  Fluoroscopy Time:  1 minutes 48 seconds
Radiation Exposure Index (if provided by the fluoroscopic device):
Number of Acquired Spot Images: 0

[Series 1: cp_standard · 0.34mm/px · 2 of 57 frames shown (1 of 9)]
[frame 7/57]
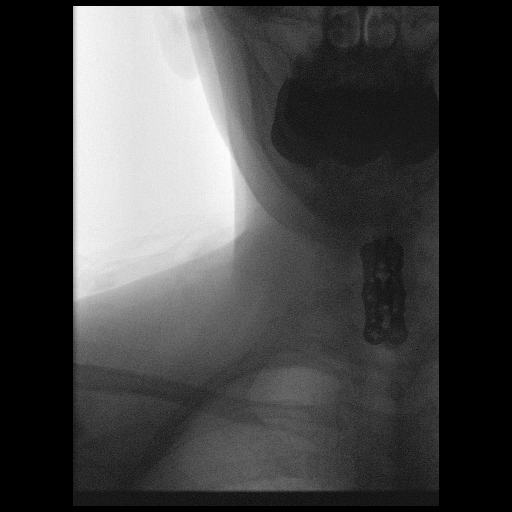
[frame 29/57]
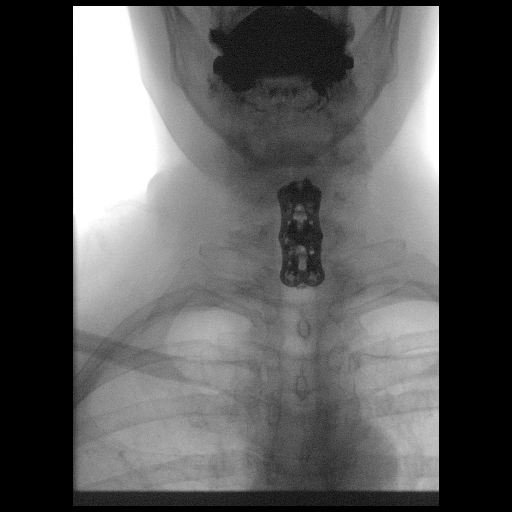

[Series 2: cp_standard · 0.34mm/px · 2 of 64 frames shown (2 of 9)]
[frame 10/64]
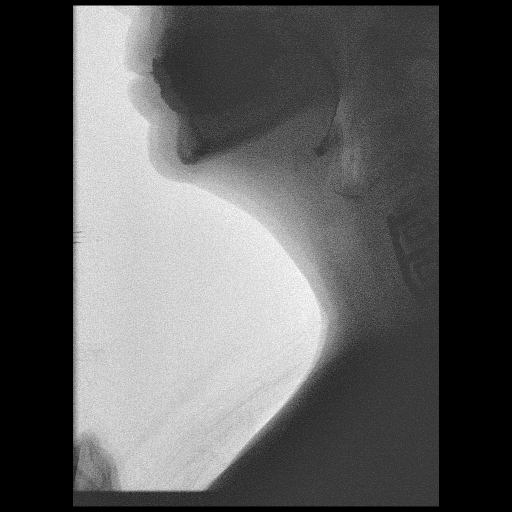
[frame 33/64]
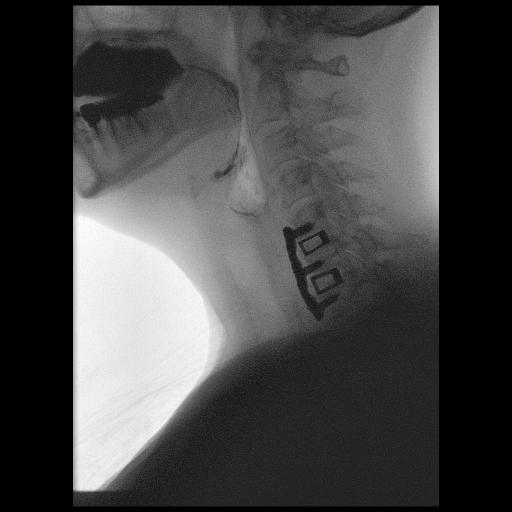

[Series 3: cp_standard · 0.34mm/px · 2 of 115 frames shown (3 of 9)]
[frame 18/115]
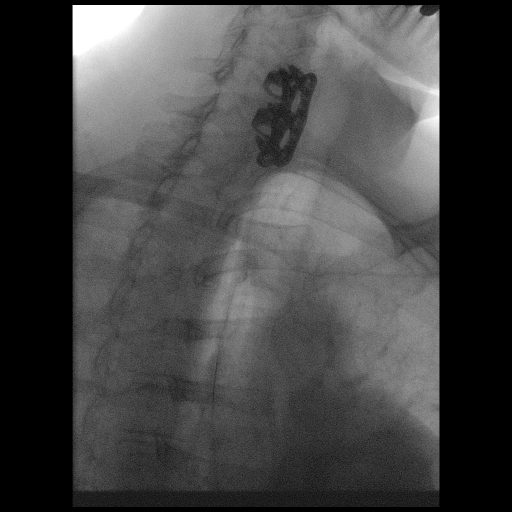
[frame 40/115]
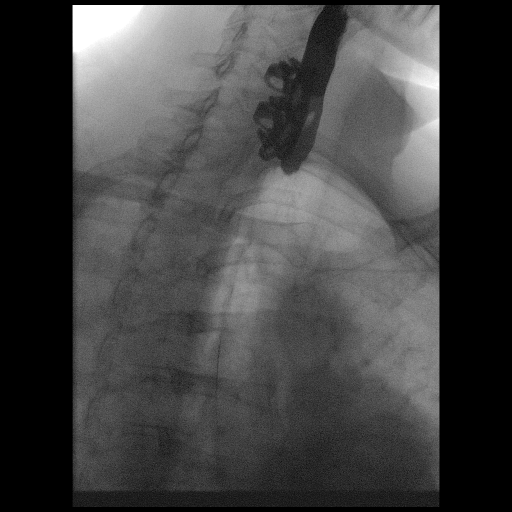

[Series 4: cp_standard · 0.34mm/px · 3 of 81 frames shown (4 of 9)]
[frame 13/81]
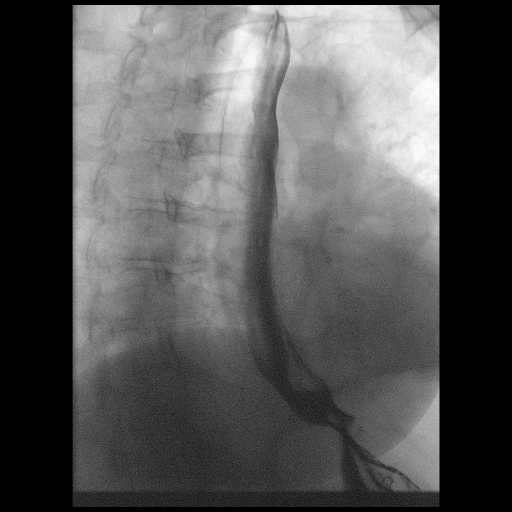
[frame 69/81]
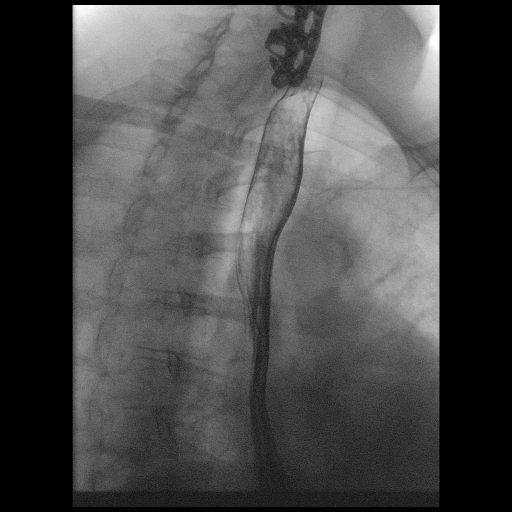
[frame 76/81]
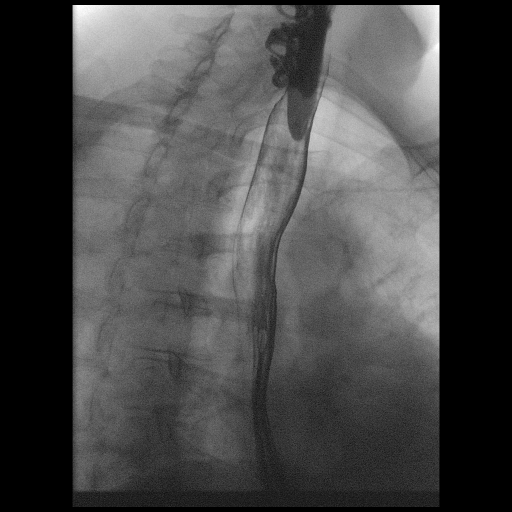

[Series 5: cp_standard · 0.34mm/px · 1 of 62 frames shown (5 of 9)]
[frame 43/62]
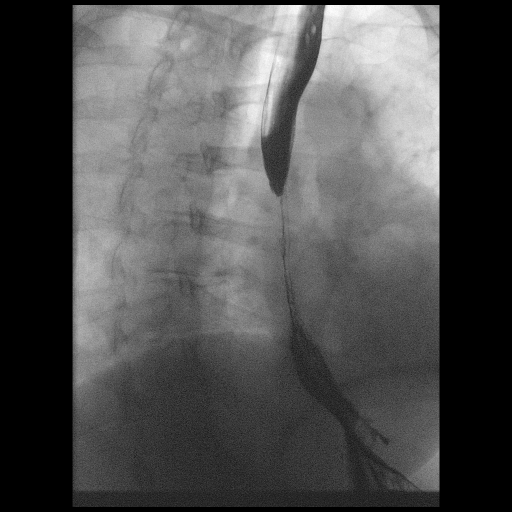

[Series 6: cp_standard · 0.35mm/px · 3 of 89 frames shown (6 of 9)]
[frame 14/89]
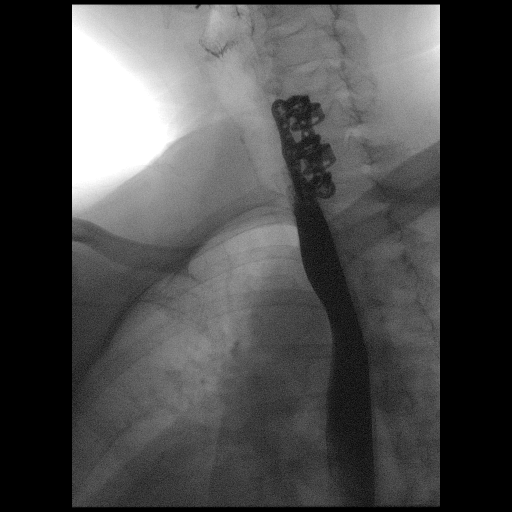
[frame 39/89]
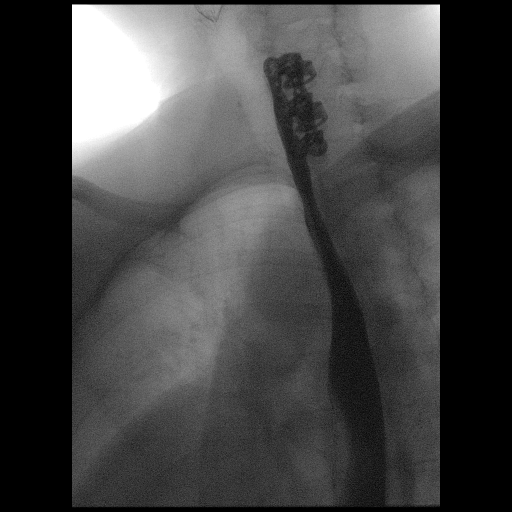
[frame 76/89]
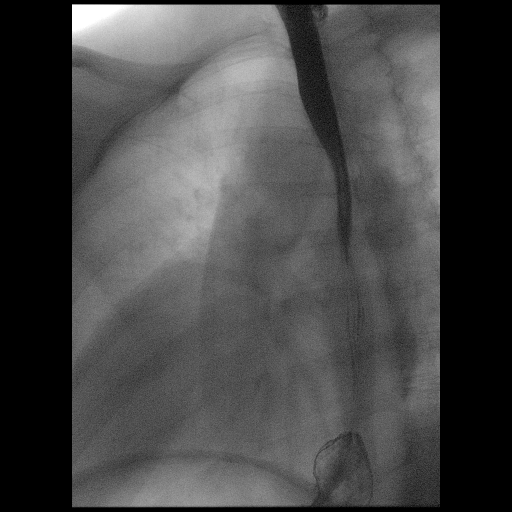

[Series 7: cp_standard · 0.35mm/px · 2 of 80 frames shown (7 of 9)]
[frame 41/80]
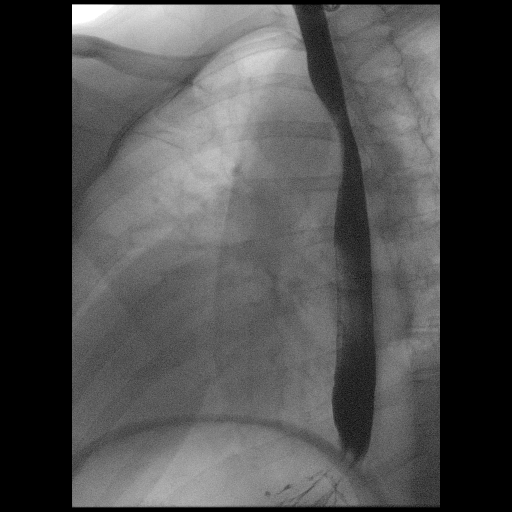
[frame 69/80]
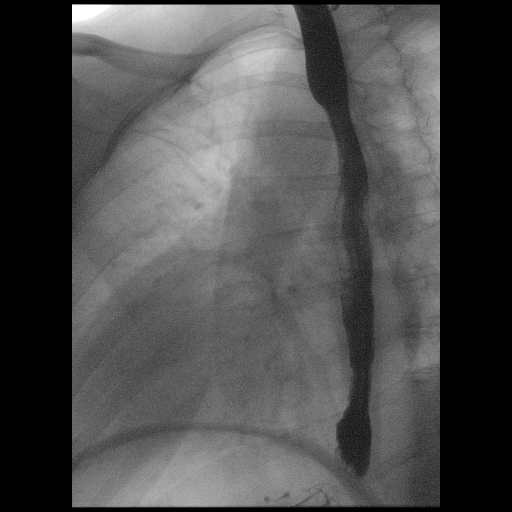

[Series 8: cp_standard · 0.35mm/px · 2 of 168 frames shown (8 of 9)]
[frame 26/168]
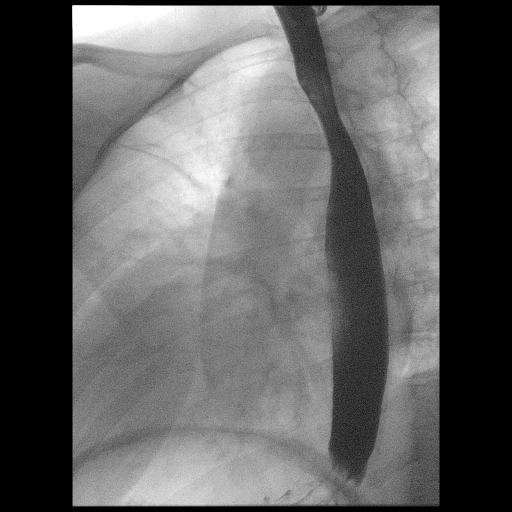
[frame 85/168]
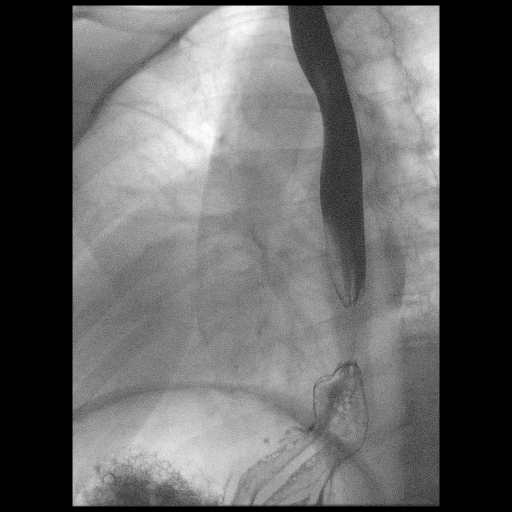

[Series 9: cp_standard · 0.34mm/px · 2 of 57 frames shown (9 of 9)]
[frame 15/57]
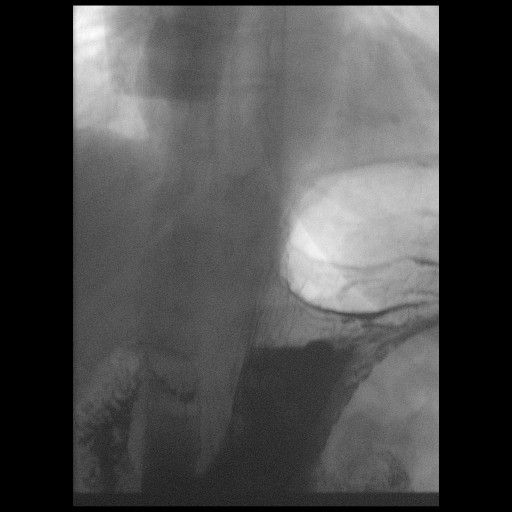
[frame 49/57]
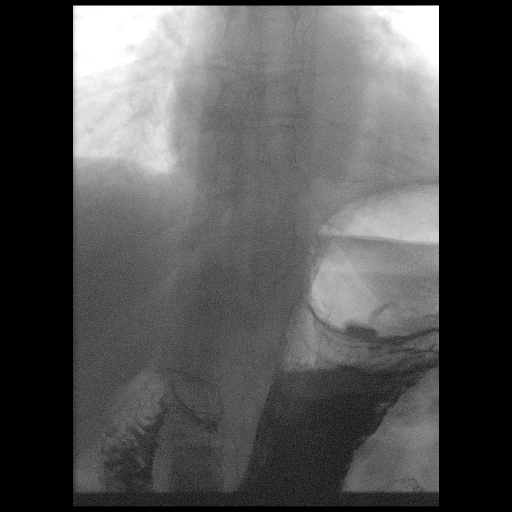

[19 of 24 positions shown; findings below may reference images not displayed]

FINDINGS: Fluoroscopic evaluation of swallowing demonstrates normal pharyngeal
function. There is disruption [DATE] primary esophageal
peristaltic waves with proximal escape. No fixed stricture, fold
thickening or mass. No reflux with the water siphon maneuver. No
hiatal hernia. The patient swallowed a 13 mm barium tablet which
freely passed into the stomach.
IMPRESSION: Nonspecific esophageal motility disorder.

No fixed stricture.

## 2017-05-29 DIAGNOSIS — E782 Mixed hyperlipidemia: Secondary | ICD-10-CM | POA: Diagnosis not present

## 2017-05-29 DIAGNOSIS — I10 Essential (primary) hypertension: Secondary | ICD-10-CM | POA: Diagnosis not present

## 2017-05-29 DIAGNOSIS — I2510 Atherosclerotic heart disease of native coronary artery without angina pectoris: Secondary | ICD-10-CM | POA: Diagnosis not present

## 2017-06-05 ENCOUNTER — Other Ambulatory Visit (HOSPITAL_BASED_OUTPATIENT_CLINIC_OR_DEPARTMENT_OTHER): Payer: BLUE CROSS/BLUE SHIELD

## 2017-06-05 ENCOUNTER — Encounter: Payer: Self-pay | Admitting: Hematology

## 2017-06-05 ENCOUNTER — Ambulatory Visit (HOSPITAL_BASED_OUTPATIENT_CLINIC_OR_DEPARTMENT_OTHER): Payer: BLUE CROSS/BLUE SHIELD | Admitting: Hematology

## 2017-06-05 VITALS — BP 108/77 | HR 73 | Temp 97.3°F | Resp 18 | Ht 61.0 in | Wt 176.2 lb

## 2017-06-05 DIAGNOSIS — D472 Monoclonal gammopathy: Secondary | ICD-10-CM

## 2017-06-05 DIAGNOSIS — D892 Hypergammaglobulinemia, unspecified: Secondary | ICD-10-CM

## 2017-06-05 LAB — COMPREHENSIVE METABOLIC PANEL
ALBUMIN: 4.2 g/dL (ref 3.5–5.0)
ALK PHOS: 102 U/L (ref 40–150)
ALT: 18 U/L (ref 0–55)
ANION GAP: 11 meq/L (ref 3–11)
AST: 19 U/L (ref 5–34)
BILIRUBIN TOTAL: 0.84 mg/dL (ref 0.20–1.20)
BUN: 15.1 mg/dL (ref 7.0–26.0)
CALCIUM: 9.5 mg/dL (ref 8.4–10.4)
CO2: 23 mEq/L (ref 22–29)
Chloride: 107 mEq/L (ref 98–109)
Creatinine: 1 mg/dL (ref 0.6–1.1)
Glucose: 100 mg/dl (ref 70–140)
POTASSIUM: 4.1 meq/L (ref 3.5–5.1)
SODIUM: 141 meq/L (ref 136–145)
TOTAL PROTEIN: 7.5 g/dL (ref 6.4–8.3)

## 2017-06-05 LAB — CBC & DIFF AND RETIC
BASO%: 0.2 % (ref 0.0–2.0)
Basophils Absolute: 0 10*3/uL (ref 0.0–0.1)
EOS%: 1.4 % (ref 0.0–7.0)
Eosinophils Absolute: 0.1 10*3/uL (ref 0.0–0.5)
HCT: 41.1 % (ref 34.8–46.6)
HGB: 14.1 g/dL (ref 11.6–15.9)
IMMATURE RETIC FRACT: 6.7 % (ref 1.60–10.00)
LYMPH#: 1.6 10*3/uL (ref 0.9–3.3)
LYMPH%: 27.9 % (ref 14.0–49.7)
MCH: 32.3 pg (ref 25.1–34.0)
MCHC: 34.3 g/dL (ref 31.5–36.0)
MCV: 94.1 fL (ref 79.5–101.0)
MONO#: 0.4 10*3/uL (ref 0.1–0.9)
MONO%: 6.7 % (ref 0.0–14.0)
NEUT%: 63.8 % (ref 38.4–76.8)
NEUTROS ABS: 3.6 10*3/uL (ref 1.5–6.5)
PLATELETS: 251 10*3/uL (ref 145–400)
RBC: 4.37 10*6/uL (ref 3.70–5.45)
RDW: 12.5 % (ref 11.2–14.5)
RETIC %: 2.04 % (ref 0.70–2.10)
RETIC CT ABS: 89.15 10*3/uL (ref 33.70–90.70)
WBC: 5.7 10*3/uL (ref 3.9–10.3)

## 2017-06-05 NOTE — Progress Notes (Signed)
Marland Kitchen    HEMATOLOGY/ONCOLOGY CLINIC NOTE  Date of Service: 06/05/2017  Patient Care Team: Burnis Medin, MD as PCP - General Milus Banister, MD (Gastroenterology) Susa Day, MD (Orthopedic Surgery) Melina Schools, MD (Orthopedic Surgery) Minus Breeding, MD (Cardiology) Myriam Jacobson as Consulting Physician (Cardiology) Bo Merino, MD (Rheumatology) Emi Holes, MD as Referring Physician (Cardiothoracic Surgery) Leta Baptist, MD as Consulting Physician (Otolaryngology)  CHIEF COMPLAINTS/PURPOSE OF CONSULTATION:  Abnormal IFE  HISTORY OF PRESENTING ILLNESS:   Margaret Howard is a wonderful 51 y.o. female who has been referred to [place] by Dr .Regis Bill, [name], MD /Dr Estanislado Pandy MD  for evaluation and management of possible monoclonal paraproteinemia.  Patient has a history of hypertension, coronary artery disease status post PCI, Sjogren syndrome with keratoconjunctivitis sicca , GERD, osteoarthritis, fibromyalgia was seen by a rheumatologist Dr. Estanislado Pandy and has initial workup for her joint pains had an SPEP on 09/13/2016 that showed no overt M spike but IFE showed a poorly defined area of restricted protein mobility reactive to IgM and kappa antisera suggestive of possible IgM kappa monoclonal gammopathy .  She was referred to [place] for further evaluation . Patient has some generalized body aches from her fibromyalgia . Continues to have significant sicca and chronic dry cough symptoms .has has been on prednisone for her rheumatologic issues . No fevers no chills no night sweats no weight loss. No focal spine or skull pain.  INTERVAL HISTORY  Margaret Howard is here for a f/u of her possible paraproteinemia. She states she is doing well overall. She has relocated to [facility] and comes back every 3 weeks to visit her grandchild. Blood counts are steady and normal.  No new focla bone pains, enlarged LN, fevers, chills or weight loss.  On review of systems, pt reports dry eye,  dry mouth, joint pains, weight gain and denies fever, chills, night sweats, mouth sores, abdominal pain and any other accompanying symptoms.   MEDICAL HISTORY:  Past Medical History:  Diagnosis Date  . Arthritis    spine, feet, hands, knees  . CAD (coronary artery disease)    LHC 06/27/11: mLAD 50-60%, ostial septal perf 50%, mRCA occluded with L-R collats, EF 55% with inf HK.  She underwent FFR of he LAD (0.87 after adenosine) and this was not hemodynamically significant  . Fall 10/20/2011  . Fibromyalgia   . GERD (gastroesophageal reflux disease)   . Heart attack ([place]) 2012   1 stent  . Hypertension    has been off med. > 6 mos.  . Lipoma of thigh    left  . Positive ANA (antinuclear antibody)   Sjogren syndrome with  keratoconjunctivitis sicca  SURGICAL HISTORY: Past Surgical History:  Procedure Laterality Date  . ANKLE FRACTURE SURGERY Left   . ANTERIOR CERVICAL DISCECTOMY  12/12/2010   and fusion C5-6, C6-7  . BREAST SURGERY  12/11   MRI guided breast biopsy  . CAROTID STENT  08/26/11   [facility]  . CARPAL TUNNEL RELEASE  06/29/2008; 08/2009   right; left  . COLONOSCOPY    . ESOPHAGOGASTRODUODENOSCOPY (EGD) WITH PROPOFOL N/A 08/10/2015   Procedure: ESOPHAGOGASTRODUODENOSCOPY (EGD) WITH PROPOFOL;  Surgeon: Milus Banister, MD;  Location: [facility];  Service: Endoscopy;  Laterality: N/A;  WITH BRAVO PROBE ON MEDICINE  . FOOT SURGERY     Right, fracture repair  . FRACTIONAL FLOW RESERVE WIRE N/A 06/27/2011   Procedure: FRACTIONAL FLOW RESERVE WIRE;  Surgeon: Burnell Blanks, MD;  Location:  [facility] CATH LAB;  Service: Cardiovascular;  Laterality: N/A;  . left foot  06/29/2008   scar tissue left foot  . LIPOMA EXCISION  12/13/2008   bilat. upper extremity masses  . [place]   microdisecetomy and laminectomy L5-S1    SOCIAL HISTORY: Social History   Socioeconomic History  . Marital status: Married    Spouse name: Not on file  . Number of children: 3  . Years  of education: Not on file  . Highest education level: Not on file  Social Needs  . Financial resource strain: Not on file  . Food insecurity - worry: Not on file  . Food insecurity - inability: Not on file  . Transportation needs - medical: Not on file  . Transportation needs - non-medical: Not on file  Occupational History  . Occupation: Aeronautical engineer  Tobacco Use  . Smoking status: Former Smoker    Packs/day: 1.00    Last attempt to quit: 03/07/1986    Years since quitting: 31.2  . Smokeless tobacco: Never Used  Substance and Sexual Activity  . Alcohol use: Yes    Alcohol/week: 3.0 oz    Types: 5 Standard drinks or equivalent per week  . Drug use: No  . Sexual activity: Yes    Partners: Male    Birth control/protection: Post-menopausal  Other Topics Concern  . Not on file  Social History Narrative   Occupation: Consulting civil engineer pre K   HH of 6  Now 2  Children out of home  [place] [employer] [place]   4 dogs, cat, bird, frog and mouse   G3P3   Has a grandchild      [place]    FAMILY HISTORY: Family History  Problem Relation Age of Onset  . Melanoma Mother   . Breast cancer Mother 37  . Hypertension Mother   . Melanoma Father   . Other Father        oral  . Osteoporosis Father        hx of hip fracture and died from med problems after this.from MI  . Coronary artery disease Father 40  . Cancer Father        oral, lived 59 years  . Breast cancer Maternal Aunt        early 8's  . Colon cancer Paternal Aunt   . Cancer Paternal Aunt        ovarian  . Ovarian cancer Paternal Aunt   . Colon cancer Paternal Aunt   . Colon cancer Cousin 57  . Colon cancer Cousin 72    ALLERGIES:  has No Known Allergies.  MEDICATIONS:  Current Outpatient Medications  Medication Sig Dispense Refill  . aspirin 81 MG tablet Take 81 mg by mouth daily.    . carvedilol (COREG) 6.25 MG tablet Take 6.25 mg by mouth 2 (two) times daily with a meal.    . clopidogrel (PLAVIX) 75 MG  tablet Take 75 mg by mouth daily. Dr. Myriam Jacobson    . CRESTOR 20 MG tablet TAKE 1 TABELT BY MOUTH AT BEDTIME 90 tablet 3  . methocarbamol (ROBAXIN) 500 MG tablet Take 1-2 tablets (500-1,000 mg total) by mouth every 6 (six) hours as needed for muscle spasms. 40 tablet 1  . nitroGLYCERIN (NITROSTAT) 0.4 MG SL tablet Place 0.4 mg under the tongue every 5 (five) minutes as needed for chest pain.     . pantoprazole (PROTONIX) 40 MG tablet TAKE 1 TABLET TWICE A DAY  FOR A MEAL 180 tablet 3  . ranitidine (ZANTAC) 150 MG tablet Take 150 mg by mouth at bedtime.  12  . triamcinolone (KENALOG) 0.1 % paste APPLY TO AFFECTED AREA 4 TIMES A DAY AFTER MEALS AND AT BEDTIME  12  . valsartan (DIOVAN) 80 MG tablet TAKE 1 TABLET BY MOUTH ONCE DAILY 90 tablet 3   No current facility-administered medications for this visit.     REVIEW OF SYSTEMS:    10 Point review of Systems was done is negative except as noted above.  PHYSICAL EXAMINATION: ECOG PERFORMANCE STATUS: 1 - Symptomatic but completely ambulatory  . There were no vitals filed for this visit. There were no vitals filed for this visit. .There is no height or weight on file to calculate BMI.  GENERAL:alert, in no acute distress and comfortable SKIN: no acute rashes, no significant lesions EYES: conjunctiva are pink and non-injected, sclera anicteric OROPHARYNX: MMM, no exudates, no oropharyngeal erythema or ulceration NECK: supple, no JVD LYMPH:  no palpable lymphadenopathy in the cervical, axillary or inguinal regions LUNGS: clear to auscultation b/l with normal respiratory effort HEART: regular rate & rhythm ABDOMEN:  normoactive bowel sounds , non tender, not distended. Extremity: no pedal edema PSYCH: alert & oriented x 3 with fluent speech NEURO: no focal motor/sensory deficits  LABORATORY DATA:  I have reviewed the data as listed  . CBC Latest Ref Rng & Units 06/05/2017 12/05/2016 09/13/2016  WBC 3.9 - 10.3 10e3/uL 5.7 9.7 5.6    Hemoglobin 11.6 - 15.9 g/dL 14.1 14.2 14.4  Hematocrit 34.8 - 46.6 % 41.1 41.4 42.1  Platelets 145 - 400 10e3/uL 251 220 241    . CMP Latest Ref Rng & Units 06/05/2017 06/05/2017 12/05/2016  Glucose 70 - 140 mg/dl 100 - 125  BUN 7.0 - 26.0 mg/dL 15.1 - 13.1  Creatinine 0.6 - 1.1 mg/dL 1.0 - 0.9  Sodium 136 - 145 mEq/L 141 - 140  Potassium 3.5 - 5.1 mEq/L 4.1 - 4.2  Chloride 98 - 110 mmol/L - - -  CO2 22 - 29 mEq/L 23 - 26  Calcium 8.4 - 10.4 mg/dL 9.5 - 9.9  Total Protein 6.4 - 8.3 g/dL 7.5 7.1 7.3  Total Bilirubin 0.20 - 1.20 mg/dL 0.84 - 0.58  Alkaline Phos 40 - 150 U/L 102 - 101  AST 5 - 34 U/L 19 - 17  ALT 0 - 55 U/L 18 - 19      RADIOGRAPHIC STUDIES: I have personally reviewed the radiological images as listed and agreed with the findings in the report. No results found.  ASSESSMENT & PLAN:   51 year old female with  #1 IgM kappa monoclonal protein on SPEP in March 2018.   This is likely transient and related to the patient's underlying Sjogren's disease causing chronic inflammation. Her myeloma panel was repeated and shows no M spike. IFE shows a polyclonal IgM kappa and lambda gammopathy likely from her underlying chronic inflammatory state. Serum free light chains showed no evidence of a clonal paraproteinemia. 24 hour UPEP is negative for M protein LDH level is within normal limits Sedimentation rate within normal limits. Patient has no anemia normal kidney function no hypercalcemia. CBC shows no cytopenias or other abnormalities.  Plan -rpt interval labs today show no monoclonal paraproteinemia. -Patient appears to have a reactive polyclonal gammopathy likely related to her chronic inflammatory state from her Sjogren's disease -no evidence of a monoclonal paraproteinemia or lymphoma at this time. -She is at increased risk of certain  lymphomas and other malignancies in the setting of immune suppression and abnormal immunity related to her Sjogren's  disease. -Continued follow-up with rheumatology and primary care physician. -No indication for bone marrow biopsy and whole body skeletal screening at this time -Kindly consult [place] earlier if there is any change in clinical status, Lymphadenopathy or hepatosplenomegaly or other significant CBC abnormalities . -Will now see pt on as needed basis unless any change in clinical status   RTC with Dr Irene Limbo as needed  All of the patients questions were answered with apparent satisfaction. The patient knows to call the clinic with any problems, questions or concerns.  I spent 20 minutes counseling the patient face to face. The total time spent in the appointment was 25 minutes and more than 50% was on counseling and direct patient cares.    Sullivan Lone MD [place] AAHIVMS [facility] Hematology/Oncology Physician [facility]  (Office):       [phone] (Work cell):  [phone] (Fax):           [phone]  06/05/2017 9:46 AM  This document serves as a record of services personally performed by Sullivan Lone, MD. It was created on his behalf by Alean Rinne, a trained medical scribe. The creation of this record is based on the scribe's personal observations and the provider's statements to them.   .I have reviewed the above documentation for accuracy and completeness, and I agree with the above. Brunetta Genera MD MS

## 2017-06-06 LAB — KAPPA/LAMBDA LIGHT CHAINS
IG KAPPA FREE LIGHT CHAIN: 14 mg/L (ref 3.3–19.4)
Ig Lambda Free Light Chain: 13.9 mg/L (ref 5.7–26.3)
Kappa/Lambda FluidC Ratio: 1.01 (ref 0.26–1.65)

## 2017-06-09 LAB — MULTIPLE MYELOMA PANEL, SERUM
ALBUMIN SERPL ELPH-MCNC: 3.9 g/dL (ref 2.9–4.4)
ALPHA 1: 0.2 g/dL (ref 0.0–0.4)
Albumin/Glob SerPl: 1.3 (ref 0.7–1.7)
Alpha2 Glob SerPl Elph-Mcnc: 0.7 g/dL (ref 0.4–1.0)
B-Globulin SerPl Elph-Mcnc: 1.1 g/dL (ref 0.7–1.3)
Gamma Glob SerPl Elph-Mcnc: 1.2 g/dL (ref 0.4–1.8)
Globulin, Total: 3.2 g/dL (ref 2.2–3.9)
IGA/IMMUNOGLOBULIN A, SERUM: 244 mg/dL (ref 87–352)
IGM (IMMUNOGLOBIN M), SRM: 346 mg/dL — AB (ref 26–217)
IgG, Qn, Serum: 886 mg/dL (ref 700–1600)
Total Protein: 7.1 g/dL (ref 6.0–8.5)

## 2017-07-16 DIAGNOSIS — Z111 Encounter for screening for respiratory tuberculosis: Secondary | ICD-10-CM | POA: Diagnosis not present

## 2017-07-16 NOTE — Progress Notes (Signed)
Office Visit Note  Patient: Margaret Howard             Date of Birth: 1965/08/20           MRN: [id number]             PCP: Burnis Medin, MD Referring: Burnis Medin, MD Visit Date: 07/30/2017 Occupation: @GUAROCC @    Subjective:  Other (dry eyes, dry mouth )   History of Present Illness: Margaret Howard is a 52 y.o. female with history of Sjogren's, osteoarthritis and disc disease. She states her sicca symptoms continue to be a problem. She's been having some Raynaud's phenomenon during the wintertime. She continues to have pain in her neck and lower back due to underlying disc disease. She also has osteoarthritis in her hands and knee joints.   Activities of Daily Living:  Patient reports morning stiffness for 30 minutes.   Patient Denies nocturnal pain.  Difficulty dressing/grooming: Denies Difficulty climbing stairs: Reports Difficulty getting out of chair: Reports Difficulty using hands for taps, buttons, cutlery, and/or writing: Reports   Review of Systems  Constitutional: Positive for fatigue. Negative for night sweats, weight gain, weight loss and weakness.  HENT: Positive for mouth dryness. Negative for mouth sores, trouble swallowing, trouble swallowing and nose dryness.   Eyes: Positive for dryness. Negative for pain, redness and visual disturbance.  Respiratory: Negative for cough, shortness of breath and difficulty breathing.   Cardiovascular: Negative for chest pain, palpitations, hypertension, irregular heartbeat and swelling in legs/feet.  Gastrointestinal: Negative for blood in stool, constipation and diarrhea.  Endocrine: Negative for increased urination.  Genitourinary: Negative for vaginal dryness.  Musculoskeletal: Positive for arthralgias, joint pain, myalgias, morning stiffness and myalgias. Negative for joint swelling, muscle weakness and muscle tenderness.  Skin: Positive for color change. Negative for rash, hair loss, skin tightness, ulcers and  sensitivity to sunlight.  Allergic/Immunologic: Negative for susceptible to infections.  Neurological: Negative for dizziness, memory loss and night sweats.  Hematological: Negative for swollen glands.  Psychiatric/Behavioral: Negative for depressed mood and sleep disturbance. The patient is not nervous/anxious.     PMFS History:  Patient Active Problem List   Diagnosis Date Noted  . Primary osteoarthritis of both hands 09/13/2016  . Primary osteoarthritis of both knees 09/13/2016  . DJD (degenerative joint disease), cervical 09/13/2016  . DDD lumbar spine 09/13/2016  . Abnormal laboratory test 09/13/2016  . Atherosclerosis of native coronary artery of native heart without angina pectoris 12/21/2014  . History of degenerative disc disease 09/20/2014  . S/P coronary artery stent placement 08/29/2014  . Medication management 09/28/2013  . Weight gain 09/28/2013  . Osteopenia 08/11/2012  . Contusion 10/20/2011  . Other and unspecified hyperlipidemia 09/19/2011  . Dyslipidemia 07/23/2011  . CAD (coronary artery disease) 06/27/2011  . Chest pain 06/04/2011  . Arthritis 01/15/2011  . Amenorrhea 01/15/2011  . Swelling of ankle 11/23/2010  . Anxiety reaction 11/23/2010  . Positive ANA (antinuclear antibody) 11/23/2010  . DJD (degenerative joint disease), thoracolumbar 10/22/2010  . Lipoma of thigh 10/22/2010  . Incidental pulmonary nodule, greater than or equal to 79mm 09/20/2010  . ABDOMINAL PAIN RIGHT UPPER QUADRANT 09/11/2010  . Dysphagia 09/03/2010  . OTHER DYSPHAGIA 09/03/2010  . RECTAL PAIN 07/16/2010  . PELVIC  PAIN 07/16/2010  . DELAYED MENSES 05/31/2010  . MUSCLE SPASM, TRAPEZIUS MUSCLE, RIGHT 05/14/2010  . MASS, LEFT AXILLA 07/07/2009  . TOBACCO USE, QUIT 04/25/2009  . CONSTIPATION 02/22/2009  . LUMBAR RADICULOPATHY, LEFT 07/29/2008  .  ADJ DISORDER WITH MIXED ANXIETY & DEPRESSED MOOD 10/19/2007  . Essential hypertension 02/24/2007  . Pain in joint 02/18/2007  .  FIBROMYALGIA 02/18/2007  . ADVEF, DRUG/MED/BIOL SUBST, OTHER DRUG NOS 01/21/2007    Past Medical History:  Diagnosis Date  . Arthritis    spine, feet, hands, knees  . CAD (coronary artery disease)    LHC 06/27/11: mLAD 50-60%, ostial septal perf 50%, mRCA occluded with L-R collats, EF 55% with inf HK.  She underwent FFR of he LAD (0.87 after adenosine) and this was not hemodynamically significant  . Fall 10/20/2011  . Fibromyalgia   . GERD (gastroesophageal reflux disease)   . Heart attack ([place]) 2012   1 stent  . Hypertension    has been off med. > 6 mos.  . Lipoma of thigh    left  . Positive ANA (antinuclear antibody)     Family History  Problem Relation Age of Onset  . Melanoma Mother   . Breast cancer Mother 25  . Hypertension Mother   . Melanoma Father   . Other Father        oral  . Osteoporosis Father        hx of hip fracture and died from med problems after this.from MI  . Coronary artery disease Father 69  . Cancer Father        oral, lived 69 years  . Breast cancer Maternal Aunt        early 85's  . Colon cancer Paternal Aunt   . Cancer Paternal Aunt        ovarian  . Ovarian cancer Paternal Aunt   . Colon cancer Paternal Aunt   . Colon cancer Cousin 56  . Colon cancer Cousin 53   Past Surgical History:  Procedure Laterality Date  . ANKLE FRACTURE SURGERY Left   . ANTERIOR CERVICAL DISCECTOMY  12/12/2010   and fusion C5-6, C6-7  . BREAST SURGERY  12/11   MRI guided breast biopsy  . CAROTID STENT  08/26/11   [facility]  . CARPAL TUNNEL RELEASE  06/29/2008; 08/2009   right; left  . COLONOSCOPY    . ESOPHAGOGASTRODUODENOSCOPY (EGD) WITH PROPOFOL N/A 08/10/2015   Procedure: ESOPHAGOGASTRODUODENOSCOPY (EGD) WITH PROPOFOL;  Surgeon: Milus Banister, MD;  Location: [facility];  Service: Endoscopy;  Laterality: N/A;  WITH BRAVO PROBE ON MEDICINE  . FOOT SURGERY     Right, fracture repair  . FRACTIONAL FLOW RESERVE WIRE N/A 06/27/2011   Procedure: FRACTIONAL FLOW  RESERVE WIRE;  Surgeon: Burnell Blanks, MD;  Location: [facility] CATH LAB;  Service: Cardiovascular;  Laterality: N/A;  . left foot  06/29/2008   scar tissue left foot  . LIPOMA EXCISION  12/13/2008   bilat. upper extremity masses  . [place]   microdisecetomy and laminectomy L5-S1   Social History   Social History Narrative   Occupation: Consulting civil engineer pre [employer]   HH of 6  Now 2  Children out of home  [place] [employer] [place]   4 dogs, cat, bird, frog and mouse   G3P3   Has a grandchild      [place]     Objective: Vital Signs: BP (!) 132/94 (BP Location: Left Arm, Patient Position: Sitting, Cuff Size: Normal)   Pulse 83   Resp 15   Ht 5\' 2"  (1.575 m)   Wt 180 lb (81.6 kg)   LMP 08/25/2010   BMI 32.92 kg/m    Physical Exam  Constitutional: She  is oriented to person, place, and time. She appears well-developed and well-nourished.  HENT:  Head: Normocephalic and atraumatic.  Eyes: Conjunctivae and EOM are normal.  Neck: Normal range of motion.  Cardiovascular: Normal rate, regular rhythm, normal heart sounds and intact distal pulses.  Pulmonary/Chest: Effort normal and breath sounds normal.  Abdominal: Soft. Bowel sounds are normal.  Lymphadenopathy:    She has no cervical adenopathy.  Neurological: She is alert and oriented to person, place, and time.  Skin: Skin is warm and dry. Capillary refill takes less than 2 seconds.  Psychiatric: She has a normal mood and affect. Her behavior is normal.  Nursing note and vitals reviewed.    Musculoskeletal Exam: C-spine and thoracic lumbar spine limited range of motion. Shoulder joints elbow joints wrist joints with good range of motion. She has some DIP PIP thickening in her hands consistent with osteoarthritis. No synovitis was noted. Hip joints, knee joints, ankles MTPs PIPs DIPs are good range of motion. She is some crepitus in her knee joints. She has generalized hyperalgesia from fibromyalgia.  CDAI Exam: No  CDAI exam completed.    Investigation: No additional findings. CBC Latest Ref Rng & Units 06/05/2017 12/05/2016 09/13/2016  WBC 3.9 - 10.3 10e3/uL 5.7 9.7 5.6  Hemoglobin 11.6 - 15.9 g/dL 14.1 14.2 14.4  Hematocrit 34.8 - 46.6 % 41.1 41.4 42.1  Platelets 145 - 400 10e3/uL 251 220 241   CMP Latest Ref Rng & Units 06/05/2017 06/05/2017 12/05/2016  Glucose 70 - 140 mg/dl 100 - 125  BUN 7.0 - 26.0 mg/dL 15.1 - 13.1  Creatinine 0.6 - 1.1 mg/dL 1.0 - 0.9  Sodium 136 - 145 mEq/L 141 - 140  Potassium 3.5 - 5.1 mEq/L 4.1 - 4.2  Chloride 98 - 110 mmol/L - - -  CO2 22 - 29 mEq/L 23 - 26  Calcium 8.4 - 10.4 mg/dL 9.5 - 9.9  Total Protein 6.4 - 8.3 g/dL 7.5 7.1 7.3  Total Bilirubin 0.20 - 1.20 mg/dL 0.84 - 0.58  Alkaline Phos 40 - 150 U/L 102 - 101  AST 5 - 34 U/L 19 - 17  ALT 0 - 55 U/L 18 - 19    Imaging: No results found.  Speciality Comments: No specialty comments available.    Procedures:  No procedures performed Allergies: Patient has no known allergies.   Assessment / Plan:     Visit Diagnoses: Sjogren's syndrome with keratoconjunctivitis sicca (HCC) - ANA 1:160 homogeneous, ENA negative. She continues to have sicca symptoms.she's been using pilocarpine which is helpful. I would repeat some of the labs today.  - Plan: CBC with Differential/Platelet, COMPLETE METABOLIC PANEL WITH GFR, Urinalysis, Routine w reflex microscopic, ANA, Anti-DNA antibody, double-stranded, C3 and C4, Sedimentation rate, Sjogrens syndrome-A extractable nuclear antibody, Sjogrens syndrome-B extractable nuclear antibody  Raynaud's disease without gangrene: Warm clothing and keeping the core temperature warm was discussed.  Other fatigue - Plan: VITAMIN D 25 Hydroxy (Vit-D Deficiency, Fractures)  Primary osteoarthritis of both hands: Joint protection and muscle strengthening discussed.  Primary osteoarthritis of both knees: Weight loss diet and exercise was discussed.  DDD (degenerative disc disease),  cervical: Chronic pain  DDD (degenerative disc disease), lumbar: Chronic pain  Osteopenia of multiple sites - T score -1.3 on DEXA 10 in April 2018. Use of calcium, vitamin D and resistive exercises were discussed.  Fibromyalgia: Generalized pain and discomfort.  Dyslipidemia  Anxiety reaction  S/P coronary artery stent placement    Orders: Orders Placed This Encounter  Procedures  . CBC with Differential/Platelet  . COMPLETE METABOLIC PANEL WITH GFR  . Urinalysis, Routine w reflex microscopic  . ANA  . Anti-DNA antibody, double-stranded  . C3 and C4  . Sedimentation rate  . VITAMIN D 25 Hydroxy (Vit-D Deficiency, Fractures)  . Sjogrens syndrome-A extractable nuclear antibody  . Sjogrens syndrome-B extractable nuclear antibody   No orders of the defined types were placed in this encounter.   Face-to-face time spent with patient was 30 minutes.greater than  50% of time was spent in counseling and coordination of care.  Follow-Up Instructions: Return in about 1 year (around 07/30/2018) for Sjogren's, Osteoarthritis,FMS, DDD. we will call her back once the results are available.   Bo Merino, MD  Note - This record has been created using Editor, commissioning.  Chart creation errors have been sought, but may not always  have been located. Such creation errors do not reflect on  the standard of medical care.

## 2017-07-30 ENCOUNTER — Encounter: Payer: Self-pay | Admitting: Rheumatology

## 2017-07-30 ENCOUNTER — Ambulatory Visit: Payer: BLUE CROSS/BLUE SHIELD | Admitting: Rheumatology

## 2017-07-30 ENCOUNTER — Ambulatory Visit: Payer: BLUE CROSS/BLUE SHIELD | Admitting: Obstetrics and Gynecology

## 2017-07-30 VITALS — BP 132/94 | HR 83 | Resp 15 | Ht 62.0 in | Wt 180.0 lb

## 2017-07-30 DIAGNOSIS — M5136 Other intervertebral disc degeneration, lumbar region: Secondary | ICD-10-CM | POA: Diagnosis not present

## 2017-07-30 DIAGNOSIS — M3501 Sicca syndrome with keratoconjunctivitis: Secondary | ICD-10-CM

## 2017-07-30 DIAGNOSIS — R5383 Other fatigue: Secondary | ICD-10-CM | POA: Diagnosis not present

## 2017-07-30 DIAGNOSIS — F411 Generalized anxiety disorder: Secondary | ICD-10-CM | POA: Diagnosis not present

## 2017-07-30 DIAGNOSIS — M19041 Primary osteoarthritis, right hand: Secondary | ICD-10-CM

## 2017-07-30 DIAGNOSIS — I7300 Raynaud's syndrome without gangrene: Secondary | ICD-10-CM | POA: Diagnosis not present

## 2017-07-30 DIAGNOSIS — M170 Bilateral primary osteoarthritis of knee: Secondary | ICD-10-CM

## 2017-07-30 DIAGNOSIS — M51369 Other intervertebral disc degeneration, lumbar region without mention of lumbar back pain or lower extremity pain: Secondary | ICD-10-CM

## 2017-07-30 DIAGNOSIS — E785 Hyperlipidemia, unspecified: Secondary | ICD-10-CM | POA: Diagnosis not present

## 2017-07-30 DIAGNOSIS — Z955 Presence of coronary angioplasty implant and graft: Secondary | ICD-10-CM

## 2017-07-30 DIAGNOSIS — M797 Fibromyalgia: Secondary | ICD-10-CM

## 2017-07-30 DIAGNOSIS — M8589 Other specified disorders of bone density and structure, multiple sites: Secondary | ICD-10-CM

## 2017-07-30 DIAGNOSIS — M5030 Other cervical disc degeneration, unspecified cervical region: Secondary | ICD-10-CM

## 2017-07-30 DIAGNOSIS — M19042 Primary osteoarthritis, left hand: Secondary | ICD-10-CM

## 2017-07-31 ENCOUNTER — Telehealth: Payer: Self-pay | Admitting: *Deleted

## 2017-07-31 DIAGNOSIS — E559 Vitamin D deficiency, unspecified: Secondary | ICD-10-CM

## 2017-07-31 LAB — CBC WITH DIFFERENTIAL/PLATELET
Basophils Absolute: 18 cells/uL (ref 0–200)
Basophils Relative: 0.3 %
EOS PCT: 2 %
Eosinophils Absolute: 120 cells/uL (ref 15–500)
HCT: 40.7 % (ref 35.0–45.0)
Hemoglobin: 13.8 g/dL (ref 11.7–15.5)
LYMPHS ABS: 1620 {cells}/uL (ref 850–3900)
MCH: 32.1 pg (ref 27.0–33.0)
MCHC: 33.9 g/dL (ref 32.0–36.0)
MCV: 94.7 fL (ref 80.0–100.0)
MPV: 10.1 fL (ref 7.5–12.5)
Monocytes Relative: 6.8 %
Neutro Abs: 3834 cells/uL (ref 1500–7800)
Neutrophils Relative %: 63.9 %
PLATELETS: 255 10*3/uL (ref 140–400)
RBC: 4.3 10*6/uL (ref 3.80–5.10)
RDW: 12 % (ref 11.0–15.0)
TOTAL LYMPHOCYTE: 27 %
WBC: 6 10*3/uL (ref 3.8–10.8)
WBCMIX: 408 {cells}/uL (ref 200–950)

## 2017-07-31 LAB — URINALYSIS, ROUTINE W REFLEX MICROSCOPIC
BILIRUBIN URINE: NEGATIVE
GLUCOSE, UA: NEGATIVE
Hgb urine dipstick: NEGATIVE
Ketones, ur: NEGATIVE
Leukocytes, UA: NEGATIVE
Nitrite: NEGATIVE
PROTEIN: NEGATIVE
Specific Gravity, Urine: 1.021 (ref 1.001–1.03)
pH: 6.5 (ref 5.0–8.0)

## 2017-07-31 LAB — COMPLETE METABOLIC PANEL WITH GFR
AG RATIO: 1.6 (calc) (ref 1.0–2.5)
ALT: 16 U/L (ref 6–29)
AST: 14 U/L (ref 10–35)
Albumin: 4.2 g/dL (ref 3.6–5.1)
Alkaline phosphatase (APISO): 100 U/L (ref 33–130)
BUN: 15 mg/dL (ref 7–25)
CALCIUM: 9.4 mg/dL (ref 8.6–10.4)
CO2: 26 mmol/L (ref 20–32)
Chloride: 109 mmol/L (ref 98–110)
Creat: 0.81 mg/dL (ref 0.50–1.05)
GFR, EST AFRICAN AMERICAN: 96 mL/min/{1.73_m2} (ref >=60)
GFR, Est Non African American: 83 mL/min/{1.73_m2} (ref >=60)
Globulin: 2.6 g/dL (calc) (ref 1.9–3.7)
Glucose, Bld: 116 mg/dL — ABNORMAL HIGH (ref 65–99)
Potassium: 3.7 mmol/L (ref 3.5–5.3)
Sodium: 144 mmol/L (ref 135–146)
Total Bilirubin: 0.5 mg/dL (ref 0.2–1.2)
Total Protein: 6.8 g/dL (ref 6.1–8.1)

## 2017-07-31 LAB — C3 AND C4
C3 Complement: 130 mg/dL (ref 83–193)
C4 COMPLEMENT: 35 mg/dL (ref 15–57)

## 2017-07-31 LAB — SJOGRENS SYNDROME-B EXTRACTABLE NUCLEAR ANTIBODY: SSB (La) (ENA) Antibody, IgG: <1 AI

## 2017-07-31 LAB — SEDIMENTATION RATE: Sed Rate: 19 mm/h (ref 0–30)

## 2017-07-31 LAB — ANTI-DNA ANTIBODY, DOUBLE-STRANDED: ds DNA Ab: <1 IU/mL

## 2017-07-31 LAB — VITAMIN D 25 HYDROXY (VIT D DEFICIENCY, FRACTURES): Vit D, 25-Hydroxy: 25 ng/mL — ABNORMAL LOW (ref 30–100)

## 2017-07-31 LAB — ANTI-NUCLEAR AB-TITER (ANA TITER)

## 2017-07-31 LAB — SJOGRENS SYNDROME-A EXTRACTABLE NUCLEAR ANTIBODY: SSA (Ro) (ENA) Antibody, IgG: <1 AI

## 2017-07-31 LAB — ANA: Anti Nuclear Antibody(ANA): POSITIVE — AB

## 2017-07-31 MED ORDER — VITAMIN D (ERGOCALCIFEROL) 1.25 MG (50000 UNIT) PO CAPS: 50000.0000 [IU] | ORAL_CAPSULE | ORAL | 0 refills | Status: AC

## 2017-07-31 NOTE — Telephone Encounter (Signed)
-----   Message from Bo Merino, MD sent at 07/31/2017  1:15 PM EST ----- Vit D 50000U once a wk x58mths. Repeat labs in 3 months.

## 2017-07-31 NOTE — Progress Notes (Signed)
Vit D 50000U once a wk x37mths. Repeat labs in 3 months.

## 2017-07-31 NOTE — Progress Notes (Signed)
Labs are stable.

## 2017-09-02 DIAGNOSIS — D2262 Melanocytic nevi of left upper limb, including shoulder: Secondary | ICD-10-CM | POA: Diagnosis not present

## 2017-09-02 DIAGNOSIS — D225 Melanocytic nevi of trunk: Secondary | ICD-10-CM | POA: Diagnosis not present

## 2017-09-02 DIAGNOSIS — D1801 Hemangioma of skin and subcutaneous tissue: Secondary | ICD-10-CM | POA: Diagnosis not present

## 2017-09-02 DIAGNOSIS — L821 Other seborrheic keratosis: Secondary | ICD-10-CM | POA: Diagnosis not present

## 2017-10-07 ENCOUNTER — Other Ambulatory Visit: Payer: Self-pay | Admitting: Obstetrics & Gynecology

## 2017-10-07 DIAGNOSIS — R1310 Dysphagia, unspecified: Secondary | ICD-10-CM | POA: Diagnosis not present

## 2017-10-07 DIAGNOSIS — Z1231 Encounter for screening mammogram for malignant neoplasm of breast: Secondary | ICD-10-CM

## 2017-10-07 DIAGNOSIS — R590 Localized enlarged lymph nodes: Secondary | ICD-10-CM | POA: Diagnosis not present

## 2017-10-07 DIAGNOSIS — Z6829 Body mass index (BMI) 29.0-29.9, adult: Secondary | ICD-10-CM | POA: Diagnosis not present

## 2017-10-07 DIAGNOSIS — M3500 Sicca syndrome, unspecified: Secondary | ICD-10-CM | POA: Diagnosis not present

## 2017-10-09 NOTE — Progress Notes (Signed)
Office Visit Note  Patient: Margaret Howard             Date of Birth: [birth year]           MRN: [id number]             PCP: Burnis Medin, MD Referring: Burnis Medin, MD Visit Date: 10/16/2017 Occupation: @GUAROCC @    Subjective:  Sicca symptoms    History of Present Illness: Margaret Howard is a 52 y.o. female with history of Sjogren's, Raynaud's, osteoarthritis, fibromyalgia, and DDD.  Patient states that since her last visit on 07/03/2017 she has developed new symptoms.  She states she is continuing to have worsening dry mouth and dry eyes.  She states that she has also been having difficulty swallowing due to symptoms of dry mouth.  She denies any episodes of choking.  She continues to take pilocarpine 5 mg 3 times daily.  She states that she has increased her oral intake of water as well.  She states that she wakes up several times at night to have to drink water.  She states that she has also been using Biotene products with temporary relief.  She reports that since her last visit in January she has lost about 24 lbs due to her symptoms.  She reports that last Monday she went to urgent care and was prescribed prednisone 50 mg 1 tablet for 3 days.  She states that the prednisone helped with the difficulty of swallowing.  She states that in the past she has had a barium swallow and an endoscopy which were both normal.  She states she continues to see a dentist every 6 months.  She states she is going today to have her second procedure of having a tooth pulled.  She states that in the past she has had very good oral hygiene but feels it is worsening lately.  She says she has also developed a sensation of dizziness and like shaking.  She denies any rashes but she states that she has itching diffusely over her skin.  She says she has seen a dermatologist who made no recommendations.  She states that she has also been having worsening fatigue over the past few months.  She continues to have Raynaud's  in her fingers and toes.  She denies any digital ulcers.  She states that she is having increased pain in her bilateral hands.  She says she has occasional swelling.  She states that she has having more difficulty with fine motor movements and grip strength.  She denies any pain in her knee joints at this time.  She states that her neck and lower back pain have improved.  She states she goes to yoga 3 times a week which has been helping with her strength and flexibility. She states that her fibromyalgia has been causing muscle tenderness in her mid thoracic region.  She states that she continues to have insomnia especially since she has been having to wake up to drink water.    Activities of Daily Living:  Patient reports morning stiffness for 2-3 hours.   Patient Denies nocturnal pain.  Difficulty dressing/grooming: Denies Difficulty climbing stairs: Denies Difficulty getting out of chair: Denies Difficulty using hands for taps, buttons, cutlery, and/or writing: Reports   Review of Systems  Constitutional: Positive for fatigue.  HENT: Positive for trouble swallowing, trouble swallowing and mouth dryness. Negative for mouth sores and nose dryness.   Eyes: Positive for dryness. Negative for pain  and visual disturbance.  Respiratory: Positive for cough. Negative for hemoptysis, shortness of breath and difficulty breathing.   Cardiovascular: Negative for chest pain, palpitations, hypertension and swelling in legs/feet.  Gastrointestinal: Negative for blood in stool, constipation and diarrhea.  Endocrine: Negative for increased urination.  Genitourinary: Negative for painful urination.  Musculoskeletal: Positive for arthralgias, joint pain, morning stiffness and muscle tenderness. Negative for joint swelling, myalgias, muscle weakness and myalgias.  Skin: Negative for color change, pallor, rash, hair loss, nodules/bumps, skin tightness, ulcers and sensitivity to sunlight.  Allergic/Immunologic:  Negative for susceptible to infections.  Neurological: Negative for numbness, headaches and weakness.  Hematological: Negative for swollen glands.  Psychiatric/Behavioral: Negative for depressed mood and sleep disturbance. The patient is not nervous/anxious.     PMFS History:  Patient Active Problem List   Diagnosis Date Noted  . Primary osteoarthritis of both hands 09/13/2016  . Primary osteoarthritis of both knees 09/13/2016  . DJD (degenerative joint disease), cervical 09/13/2016  . DDD lumbar spine 09/13/2016  . Abnormal laboratory test 09/13/2016  . Atherosclerosis of native coronary artery of native heart without angina pectoris 12/21/2014  . History of degenerative disc disease 09/20/2014  . S/P coronary artery stent placement 08/29/2014  . Medication management 09/28/2013  . Weight gain 09/28/2013  . Osteopenia 08/11/2012  . Contusion 10/20/2011  . Other and unspecified hyperlipidemia 09/19/2011  . Dyslipidemia 07/23/2011  . CAD (coronary artery disease) 06/27/2011  . Chest pain 06/04/2011  . Arthritis 01/15/2011  . Amenorrhea 01/15/2011  . Swelling of ankle 11/23/2010  . Anxiety reaction 11/23/2010  . Positive ANA (antinuclear antibody) 11/23/2010  . DJD (degenerative joint disease), thoracolumbar 10/22/2010  . Lipoma of thigh 10/22/2010  . Incidental pulmonary nodule, greater than or equal to 68mm 09/20/2010  . ABDOMINAL PAIN RIGHT UPPER QUADRANT 09/11/2010  . Dysphagia 09/03/2010  . OTHER DYSPHAGIA 09/03/2010  . RECTAL PAIN 07/16/2010  . PELVIC  PAIN 07/16/2010  . DELAYED MENSES 05/31/2010  . MUSCLE SPASM, TRAPEZIUS MUSCLE, RIGHT 05/14/2010  . MASS, LEFT AXILLA 07/07/2009  . TOBACCO USE, QUIT 04/25/2009  . CONSTIPATION 02/22/2009  . LUMBAR RADICULOPATHY, LEFT 07/29/2008  . ADJ DISORDER WITH MIXED ANXIETY & DEPRESSED MOOD 10/19/2007  . Essential hypertension 02/24/2007  . Pain in joint 02/18/2007  . FIBROMYALGIA 02/18/2007  . ADVEF, DRUG/MED/BIOL SUBST, OTHER  DRUG NOS 01/21/2007    Past Medical History:  Diagnosis Date  . Arthritis    spine, feet, hands, knees  . CAD (coronary artery disease)    LHC 06/27/11: mLAD 50-60%, ostial septal perf 50%, mRCA occluded with L-R collats, EF 55% with inf HK.  She underwent FFR of he LAD (0.87 after adenosine) and this was not hemodynamically significant  . Fall 10/20/2011  . Fibromyalgia   . GERD (gastroesophageal reflux disease)   . Heart attack ([place]) 2012   1 stent  . Hypertension    has been off med. > 6 mos.  . Lipoma of thigh    left  . Positive ANA (antinuclear antibody)     Family History  Problem Relation Age of Onset  . Melanoma Mother   . Breast cancer Mother 47  . Hypertension Mother   . Melanoma Father   . Other Father        oral  . Osteoporosis Father        hx of hip fracture and died from med problems after this.from MI  . Coronary artery disease Father 51  . Cancer Father  oral, lived 51 years  . Breast cancer Maternal Aunt        early 17's  . Colon cancer Paternal Aunt   . Cancer Paternal Aunt        ovarian  . Ovarian cancer Paternal Aunt   . Colon cancer Paternal Aunt   . Colon cancer Cousin 42  . Colon cancer Cousin 47   Past Surgical History:  Procedure Laterality Date  . ANKLE FRACTURE SURGERY Left   . ANTERIOR CERVICAL DISCECTOMY  12/12/2010   and fusion C5-6, C6-7  . BREAST SURGERY  12/11   MRI guided breast biopsy  . CAROTID STENT  08/26/11   [facility]  . CARPAL TUNNEL RELEASE  06/29/2008; 08/2009   right; left  . COLONOSCOPY    . ESOPHAGOGASTRODUODENOSCOPY (EGD) WITH PROPOFOL N/A 08/10/2015   Procedure: ESOPHAGOGASTRODUODENOSCOPY (EGD) WITH PROPOFOL;  Surgeon: Milus Banister, MD;  Location: [facility] ENDOSCOPY;  Service: Endoscopy;  Laterality: N/A;  WITH BRAVO PROBE ON MEDICINE  . FOOT SURGERY     Right, fracture repair  . FRACTIONAL FLOW RESERVE WIRE N/A 06/27/2011   Procedure: FRACTIONAL FLOW RESERVE WIRE;  Surgeon: Burnell Blanks, MD;  Location:  [facility] CATH LAB;  Service: Cardiovascular;  Laterality: N/A;  . left foot  06/29/2008   scar tissue left foot  . LIPOMA EXCISION  12/13/2008   bilat. upper extremity masses  . [place]   microdisecetomy and laminectomy L5-S1   Social History   Social History Narrative   Occupation: Consulting civil engineer pre [employer]   HH of 6  Now 2  Children out of home  [place] [employer] [place]   4 dogs, cat, bird, frog and mouse   G3P3   Has a grandchild      [place]     Objective: Vital Signs: BP 105/72 (BP Location: Left Arm, Patient Position: Sitting, Cuff Size: Normal)   Pulse 69   Resp 15   Ht 5\' 1"  (1.549 m)   Wt 157 lb (71.2 kg)   LMP 08/25/2010   BMI 29.66 kg/m    Physical Exam  Constitutional: She is oriented to person, place, and time. She appears well-developed and well-nourished.  HENT:  Head: Normocephalic and atraumatic.  Eyes: Conjunctivae and EOM are normal.  Neck: Normal range of motion.  Cardiovascular: Normal rate, regular rhythm, normal heart sounds and intact distal pulses.  Pulmonary/Chest: Effort normal and breath sounds normal.  Abdominal: Soft. Bowel sounds are normal.  Lymphadenopathy:    She has no cervical adenopathy.  Neurological: She is alert and oriented to person, place, and time.  Skin: Skin is warm and dry. Capillary refill takes less than 2 seconds.  Psychiatric: She has a normal mood and affect. Her behavior is normal.  Nursing note and vitals reviewed.    Musculoskeletal Exam: C-spine, thoracic spine, lumbar spine good range of motion.  No midline spinal tenderness.  No SI joint tenderness.  Shoulder joints, elbow joints, wrist joints, MCPs, PIPs, DIPs good range of motion with no synovitis.  She has PIP and DIP synovial thickening consistent with osteoarthritis.  Hip joints, knee joints, ankle joints, MTPs, PIPs, DIPs good range of motion with no synovitis.  No warmth or effusion of bilateral knees.  No tenderness of trochanteric bursa.  CDAI  Exam: No CDAI exam completed.    Investigation: No additional findings. CBC Latest Ref Rng & Units 07/30/2017 06/05/2017 12/05/2016  WBC 3.8 - 10.8 Thousand/uL 6.0 5.7 9.7  Hemoglobin 11.7 - 15.5  g/dL 13.8 14.1 14.2  Hematocrit 35.0 - 45.0 % 40.7 41.1 41.4  Platelets 140 - 400 Thousand/uL 255 251 220   CMP Latest Ref Rng & Units 07/30/2017 06/05/2017 06/05/2017  Glucose 65 - 99 mg/dL 116(H) 100 -  BUN 7 - 25 mg/dL 15 15.1 -  Creatinine 0.50 - 1.05 mg/dL 0.81 1.0 -  Sodium 135 - 146 mmol/L 144 141 -  Potassium 3.5 - 5.3 mmol/L 3.7 4.1 -  Chloride 98 - 110 mmol/L 109 - -  CO2 20 - 32 mmol/L 26 23 -  Calcium 8.6 - 10.4 mg/dL 9.4 9.5 -  Total Protein 6.1 - 8.1 g/dL 6.8 7.5 7.1  Total Bilirubin 0.2 - 1.2 mg/dL 0.5 0.84 -  Alkaline Phos 40 - 150 U/L - 102 -  AST 10 - 35 U/L 14 19 -  ALT 6 - 29 U/L 16 18 -    Imaging: No results found.  Speciality Comments: No specialty comments available.    Procedures:  No procedures performed Allergies: Patient has no known allergies.   Assessment / Plan:     Visit Diagnoses: Sjogren's syndrome with other organ involvement ([place]) -Ro and La are negative.  ANA is 1: 160.  Since her last visit she has been having increased sicca symptoms.  She is also been having worsening dysphasia.  She has lost about 24 pounds since her last visit in January.  She has decreased her intake of food due to the anxiety of choking.  She has not had any episodes of choking.  Per patient she has had a barium swallow and endoscopy which were normal in the past.  She continues to take pilocarpine 5 mg 3 times daily.  She is also using Biotene products with temporary relief.  She has no parotid swelling on exam today.  She continues to see her dentist every 6 months and has previously had great oral hygiene.  She is going to see her dentist today to have a tooth extracted which she feels is related to her worsening Sjogren's.  She will continue using pilocarpine and Biotene  products.  We are going to add Plaquenil to her current treatment regimen.  Indications contraindications and side effects were discussed today.  All questions were addressed.  A prescription for Plaquenil 200 mg twice daily Monday through Friday will be sent to the pharmacy.  SPEP and RF will be checked today.  She will return in 5 months to to have a CBC and CMP performed to monitor for drug toxicity.- Plan: Serum protein electrophoresis with reflex, Rheumatoid factor  Patient was counseled on the purpose, proper use, and adverse effects of hydroxychloroquine including nausea/diarrhea, skin rash, headaches, and sun sensitivity.  Discussed importance of annual eye exams while on hydroxychloroquine to monitor to ocular toxicity and discussed importance of frequent laboratory monitoring.  Provided patient with eye exam form for baseline ophthalmologic exam.  Provided patient with educational materials on hydroxychloroquine and answered all questions.  Patient consented to hydroxychloroquine.  Will upload consent in the media tab.     High risk medication use -She is can be started on Plaquenil 200 mg twice daily Monday through Friday.  G6PD was ordered today.  She will return in 5 months for CBC and CMP to monitor for drug toxicity.  She was given a Plaquenil eye exam form today in the office.  Plan: Glucose 6 phosphate dehydrogenase  Sicca syndrome ([place]) -She continues to have significant sicca symptoms. ACE level was ordered today.  Ro and La were checked on 07/30/17, which were negative.   Plan: Angiotensin converting enzyme   Raynaud's disease without gangrene: No signs of gangrene or digital ulcerations on exam.  She continues to have symptoms of Raynaud's in her fingers and toes.  She is encouraged to keep her core body temperature warm and to wear gloves regularly.    Other fatigue: Worsening.  Most likely due to insomnia.  She was encouraged to continue to exercise on a regular basis.  Primary  osteoarthritis of both hands: She has PIP and DIP synovial thickening consistent with osteoarthritis of her hands.  She has no synovitis on exam today.  Joint protection and muscle strengthening were discussed.  Primary osteoarthritis of both knees: No warmth or effusion.  She has good range of motion.  She has bilateral knee crepitus.  She has no discomfort in her knee joints at this time.  DDD (degenerative disc disease), cervical: Good ROM with no discomfort.    DDD (degenerative disc disease), lumbar: No midline spinal tenderness.  No discomfort at this time.   Osteopenia of multiple sites - T score -1.3 on DEXA 10 in April 2018.  She was previously on a vitamin D supplement 50,000 units once a week.  We are rechecking her vitamin D level today.  Vitamin D deficiency -last week she finished taking 50,000 units of vitamin D once a week for 3 months.  We rechecked her vitamin D level today.  Plan: VITAMIN D 25 Hydroxy (Vit-D Deficiency, Fractures)  Fibromyalgia: She has not had any recent fibromyalgia flares.  She has been having worsening insomnia and fatigue.  Her fatigue seems to be related to having to wake up several times at night to drink water due to her symptoms of dry mouth.  She continues to have generalized muscle tenderness and muscle tension spelled especially in the midthoracic region.  She has been attending yoga 3 times a week which has been helping with her strengthening and flexibility.  He was encouraged to continue to exercise on a regular basis.  Other medical conditions are listed as follows:  Dyslipidemia  History of anxiety  S/P coronary artery stent placement      Orders: Orders Placed This Encounter  Procedures  . VITAMIN D 25 Hydroxy (Vit-D Deficiency, Fractures)  . Serum protein electrophoresis with reflex  . Rheumatoid factor  . Glucose 6 phosphate dehydrogenase  . Angiotensin converting enzyme   No orders of the defined types were placed in this  encounter.   Face-to-face time spent with patient was 30 minutes. >50% of time was spent in counseling and coordination of care.  Follow-Up Instructions: Return in about 3 months (around 01/15/2018) for Sjogren's syndrome, Raynaud's syndrome, Osteoarthritis, Fibromyalgia.  Hazel Sams PA-C  I examined and evaluated the patient with Hazel Sams PA.I had detailed discussion with the patient regarding different treatment options. She is willing to proceed with PLQ. We will start her after G6PD results. The plan of care was discussed as noted above.  Bo Merino, MD   Note - This record has been created using Editor, commissioning.  Chart creation errors have been sought, but may not always  have been located. Such creation errors do not reflect on  the standard of medical care.

## 2017-10-16 ENCOUNTER — Ambulatory Visit: Payer: BLUE CROSS/BLUE SHIELD | Admitting: Rheumatology

## 2017-10-16 ENCOUNTER — Encounter: Payer: Self-pay | Admitting: Physician Assistant

## 2017-10-16 VITALS — BP 105/72 | HR 69 | Resp 15 | Ht 61.0 in | Wt 157.0 lb

## 2017-10-16 DIAGNOSIS — M170 Bilateral primary osteoarthritis of knee: Secondary | ICD-10-CM

## 2017-10-16 DIAGNOSIS — E559 Vitamin D deficiency, unspecified: Secondary | ICD-10-CM | POA: Diagnosis not present

## 2017-10-16 DIAGNOSIS — M3509 Sicca syndrome with other organ involvement: Secondary | ICD-10-CM

## 2017-10-16 DIAGNOSIS — Z8659 Personal history of other mental and behavioral disorders: Secondary | ICD-10-CM

## 2017-10-16 DIAGNOSIS — Z955 Presence of coronary angioplasty implant and graft: Secondary | ICD-10-CM | POA: Diagnosis not present

## 2017-10-16 DIAGNOSIS — Z79899 Other long term (current) drug therapy: Secondary | ICD-10-CM

## 2017-10-16 DIAGNOSIS — M5136 Other intervertebral disc degeneration, lumbar region: Secondary | ICD-10-CM

## 2017-10-16 DIAGNOSIS — R5383 Other fatigue: Secondary | ICD-10-CM | POA: Diagnosis not present

## 2017-10-16 DIAGNOSIS — M19041 Primary osteoarthritis, right hand: Secondary | ICD-10-CM | POA: Diagnosis not present

## 2017-10-16 DIAGNOSIS — M5030 Other cervical disc degeneration, unspecified cervical region: Secondary | ICD-10-CM

## 2017-10-16 DIAGNOSIS — M19042 Primary osteoarthritis, left hand: Secondary | ICD-10-CM

## 2017-10-16 DIAGNOSIS — I7300 Raynaud's syndrome without gangrene: Secondary | ICD-10-CM | POA: Diagnosis not present

## 2017-10-16 DIAGNOSIS — M797 Fibromyalgia: Secondary | ICD-10-CM | POA: Diagnosis not present

## 2017-10-16 DIAGNOSIS — M8589 Other specified disorders of bone density and structure, multiple sites: Secondary | ICD-10-CM

## 2017-10-16 DIAGNOSIS — E785 Hyperlipidemia, unspecified: Secondary | ICD-10-CM | POA: Diagnosis not present

## 2017-10-16 DIAGNOSIS — M3500 Sicca syndrome, unspecified: Secondary | ICD-10-CM | POA: Diagnosis not present

## 2017-10-16 NOTE — Patient Instructions (Signed)

## 2017-10-22 ENCOUNTER — Other Ambulatory Visit: Payer: Self-pay | Admitting: Rheumatology

## 2017-10-22 LAB — IFE INTERPRETATION: Immunofix Electr Int: NOT DETECTED

## 2017-10-22 LAB — PROTEIN ELECTROPHORESIS, SERUM, WITH REFLEX
ALPHA 2: 0.7 g/dL (ref 0.5–0.9)
Albumin ELP: 4.1 g/dL (ref 3.8–4.8)
Alpha 1: 0.3 g/dL (ref 0.2–0.3)
BETA 2: 0.4 g/dL (ref 0.2–0.5)
Beta Globulin: 0.5 g/dL (ref 0.4–0.6)
GAMMA GLOBULIN: 1.1 g/dL (ref 0.8–1.7)
Total Protein: 7 g/dL (ref 6.1–8.1)

## 2017-10-22 LAB — RHEUMATOID FACTOR

## 2017-10-22 LAB — GLUCOSE 6 PHOSPHATE DEHYDROGENASE: G-6PDH: 19.8 U/g Hgb (ref 7.0–20.5)

## 2017-10-22 LAB — ANGIOTENSIN CONVERTING ENZYME: Angiotensin-Converting Enzyme: 29 U/L (ref 9–67)

## 2017-10-22 LAB — VITAMIN D 25 HYDROXY (VIT D DEFICIENCY, FRACTURES): Vit D, 25-Hydroxy: 74 ng/mL (ref 30–100)

## 2017-10-22 NOTE — Progress Notes (Signed)
Labs are WNL.

## 2017-11-03 ENCOUNTER — Telehealth: Payer: Self-pay | Admitting: Rheumatology

## 2017-11-03 DIAGNOSIS — H5203 Hypermetropia, bilateral: Secondary | ICD-10-CM | POA: Diagnosis not present

## 2017-11-03 NOTE — Telephone Encounter (Signed)
Patient called stating she has changed pharmacies and now uses [facility] on [address] in [place].  Their phone [phone]  Patient states that Dr. Luciano Cutter is faxing over her eye exam form for her Plaquenil.

## 2017-11-04 DIAGNOSIS — H04123 Dry eye syndrome of bilateral lacrimal glands: Secondary | ICD-10-CM | POA: Diagnosis not present

## 2017-11-04 MED ORDER — HYDROXYCHLOROQUINE SULFATE 200 MG PO TABS: ORAL_TABLET | ORAL | 2 refills | Status: AC

## 2017-11-04 NOTE — Telephone Encounter (Signed)
Last Visit: 10/16/17 Next visit: 01/15/18 Labs: 07/30/17 cbc/cmp wnl PLQ Eye exam: 11/03/17  Okay to refill per Dr. Estanislado Pandy

## 2017-11-07 ENCOUNTER — Ambulatory Visit
Admission: RE | Admit: 2017-11-07 | Discharge: 2017-11-07 | Disposition: A | Discharge status: Home / Self Care | Payer: BLUE CROSS/BLUE SHIELD | Source: Ambulatory Visit | Attending: Obstetrics & Gynecology | Admitting: Obstetrics & Gynecology

## 2017-11-07 DIAGNOSIS — Z1231 Encounter for screening mammogram for malignant neoplasm of breast: Secondary | ICD-10-CM

## 2017-11-17 ENCOUNTER — Other Ambulatory Visit: Payer: Self-pay | Admitting: Rheumatology

## 2017-11-18 DIAGNOSIS — L718 Other rosacea: Secondary | ICD-10-CM | POA: Diagnosis not present

## 2017-12-24 DIAGNOSIS — Z79899 Other long term (current) drug therapy: Secondary | ICD-10-CM | POA: Diagnosis not present

## 2017-12-24 DIAGNOSIS — M3500 Sicca syndrome, unspecified: Secondary | ICD-10-CM | POA: Diagnosis not present

## 2017-12-24 DIAGNOSIS — M064 Inflammatory polyarthropathy: Secondary | ICD-10-CM | POA: Diagnosis not present

## 2017-12-24 DIAGNOSIS — Z791 Long term (current) use of non-steroidal anti-inflammatories (NSAID): Secondary | ICD-10-CM | POA: Diagnosis not present

## 2017-12-25 ENCOUNTER — Telehealth (INDEPENDENT_AMBULATORY_CARE_PROVIDER_SITE_OTHER): Payer: Self-pay | Admitting: Rheumatology

## 2017-12-25 NOTE — Telephone Encounter (Signed)
Received VM from patient stating she had signed a release of records and the doctor had not received them. I called her back [phone] and got her VM. I left her msg advising our records do not show that we received a release form. Told her we can email or fax the release form to her that as soon as we get that we can send her records to where she authorizes them to go.

## 2017-12-29 NOTE — Telephone Encounter (Signed)
Received VM from patient stating her doctor's has sent a release for her records and they still have not received her records. IC her back and left her msg. We have not received release/request from anyone. I again stated if she would like, we can fax or email her release form

## 2018-01-15 ENCOUNTER — Ambulatory Visit: Payer: BLUE CROSS/BLUE SHIELD | Admitting: Physician Assistant

## 2018-01-21 DIAGNOSIS — M064 Inflammatory polyarthropathy: Secondary | ICD-10-CM | POA: Diagnosis not present

## 2018-01-21 DIAGNOSIS — M1990 Unspecified osteoarthritis, unspecified site: Secondary | ICD-10-CM | POA: Diagnosis not present

## 2018-01-21 DIAGNOSIS — I213 ST elevation (STEMI) myocardial infarction of unspecified site: Secondary | ICD-10-CM | POA: Diagnosis not present

## 2018-01-21 DIAGNOSIS — M797 Fibromyalgia: Secondary | ICD-10-CM | POA: Diagnosis not present

## 2018-01-21 DIAGNOSIS — K219 Gastro-esophageal reflux disease without esophagitis: Secondary | ICD-10-CM | POA: Diagnosis not present

## 2018-01-21 DIAGNOSIS — M3500 Sicca syndrome, unspecified: Secondary | ICD-10-CM | POA: Diagnosis not present

## 2018-01-21 DIAGNOSIS — Z79899 Other long term (current) drug therapy: Secondary | ICD-10-CM | POA: Diagnosis not present

## 2018-01-21 DIAGNOSIS — Z791 Long term (current) use of non-steroidal anti-inflammatories (NSAID): Secondary | ICD-10-CM | POA: Diagnosis not present

## 2018-01-28 DIAGNOSIS — E785 Hyperlipidemia, unspecified: Secondary | ICD-10-CM | POA: Diagnosis not present

## 2018-01-28 DIAGNOSIS — I10 Essential (primary) hypertension: Secondary | ICD-10-CM | POA: Diagnosis not present

## 2018-03-24 ENCOUNTER — Other Ambulatory Visit: Payer: Self-pay | Admitting: Internal Medicine

## 2018-04-22 DIAGNOSIS — M064 Inflammatory polyarthropathy: Secondary | ICD-10-CM | POA: Diagnosis not present

## 2018-04-22 DIAGNOSIS — M3500 Sicca syndrome, unspecified: Secondary | ICD-10-CM | POA: Diagnosis not present

## 2018-04-22 DIAGNOSIS — E559 Vitamin D deficiency, unspecified: Secondary | ICD-10-CM | POA: Diagnosis not present

## 2018-04-22 DIAGNOSIS — Z751 Person awaiting admission to adequate facility elsewhere: Secondary | ICD-10-CM | POA: Diagnosis not present

## 2018-04-22 DIAGNOSIS — Z79899 Other long term (current) drug therapy: Secondary | ICD-10-CM | POA: Diagnosis not present

## 2018-04-22 DIAGNOSIS — Z791 Long term (current) use of non-steroidal anti-inflammatories (NSAID): Secondary | ICD-10-CM | POA: Diagnosis not present

## 2018-05-07 DIAGNOSIS — H04123 Dry eye syndrome of bilateral lacrimal glands: Secondary | ICD-10-CM | POA: Diagnosis not present

## 2018-06-18 DIAGNOSIS — E782 Mixed hyperlipidemia: Secondary | ICD-10-CM | POA: Diagnosis not present

## 2018-06-18 DIAGNOSIS — I10 Essential (primary) hypertension: Secondary | ICD-10-CM | POA: Diagnosis not present

## 2018-06-18 DIAGNOSIS — I2510 Atherosclerotic heart disease of native coronary artery without angina pectoris: Secondary | ICD-10-CM | POA: Diagnosis not present

## 2018-06-25 DIAGNOSIS — Z01411 Encounter for gynecological examination (general) (routine) with abnormal findings: Secondary | ICD-10-CM | POA: Diagnosis not present

## 2018-06-25 DIAGNOSIS — Z124 Encounter for screening for malignant neoplasm of cervix: Secondary | ICD-10-CM | POA: Diagnosis not present

## 2018-06-25 DIAGNOSIS — Z1231 Encounter for screening mammogram for malignant neoplasm of breast: Secondary | ICD-10-CM | POA: Diagnosis not present

## 2018-06-25 DIAGNOSIS — I252 Old myocardial infarction: Secondary | ICD-10-CM | POA: Diagnosis not present

## 2018-07-31 ENCOUNTER — Ambulatory Visit: Payer: BLUE CROSS/BLUE SHIELD | Admitting: Rheumatology

## 2018-08-21 DIAGNOSIS — E785 Hyperlipidemia, unspecified: Secondary | ICD-10-CM | POA: Diagnosis not present

## 2018-08-21 DIAGNOSIS — M797 Fibromyalgia: Secondary | ICD-10-CM | POA: Diagnosis not present

## 2018-08-21 DIAGNOSIS — I10 Essential (primary) hypertension: Secondary | ICD-10-CM | POA: Diagnosis not present

## 2018-08-26 DIAGNOSIS — M79642 Pain in left hand: Secondary | ICD-10-CM | POA: Diagnosis not present

## 2018-08-26 DIAGNOSIS — M064 Inflammatory polyarthropathy: Secondary | ICD-10-CM | POA: Diagnosis not present

## 2018-08-26 DIAGNOSIS — R682 Dry mouth, unspecified: Secondary | ICD-10-CM | POA: Diagnosis not present

## 2018-08-26 DIAGNOSIS — M542 Cervicalgia: Secondary | ICD-10-CM | POA: Diagnosis not present

## 2018-08-26 DIAGNOSIS — M79641 Pain in right hand: Secondary | ICD-10-CM | POA: Diagnosis not present

## 2018-08-26 DIAGNOSIS — M3500 Sicca syndrome, unspecified: Secondary | ICD-10-CM | POA: Diagnosis not present

## 2018-08-28 DIAGNOSIS — M542 Cervicalgia: Secondary | ICD-10-CM | POA: Diagnosis not present

## 2018-09-03 DIAGNOSIS — M542 Cervicalgia: Secondary | ICD-10-CM | POA: Diagnosis not present

## 2018-09-08 DIAGNOSIS — M542 Cervicalgia: Secondary | ICD-10-CM | POA: Diagnosis not present

## 2018-10-27 DIAGNOSIS — M064 Inflammatory polyarthropathy: Secondary | ICD-10-CM | POA: Diagnosis not present

## 2018-10-27 DIAGNOSIS — Z8739 Personal history of other diseases of the musculoskeletal system and connective tissue: Secondary | ICD-10-CM | POA: Diagnosis not present

## 2018-10-27 DIAGNOSIS — Z1159 Encounter for screening for other viral diseases: Secondary | ICD-10-CM | POA: Diagnosis not present

## 2018-10-27 DIAGNOSIS — M3509 Sicca syndrome with other organ involvement: Secondary | ICD-10-CM | POA: Diagnosis not present

## 2018-11-06 DIAGNOSIS — H43813 Vitreous degeneration, bilateral: Secondary | ICD-10-CM | POA: Diagnosis not present

## 2018-11-10 DIAGNOSIS — L718 Other rosacea: Secondary | ICD-10-CM | POA: Diagnosis not present

## 2018-11-10 DIAGNOSIS — D239 Other benign neoplasm of skin, unspecified: Secondary | ICD-10-CM | POA: Diagnosis not present

## 2018-11-10 DIAGNOSIS — D229 Melanocytic nevi, unspecified: Secondary | ICD-10-CM | POA: Diagnosis not present

## 2018-12-28 DIAGNOSIS — Z1231 Encounter for screening mammogram for malignant neoplasm of breast: Secondary | ICD-10-CM | POA: Diagnosis not present

## 2019-03-02 DIAGNOSIS — M3500 Sicca syndrome, unspecified: Secondary | ICD-10-CM | POA: Diagnosis not present

## 2019-03-02 DIAGNOSIS — M064 Inflammatory polyarthropathy: Secondary | ICD-10-CM | POA: Diagnosis not present

## 2019-03-02 DIAGNOSIS — Z79899 Other long term (current) drug therapy: Secondary | ICD-10-CM | POA: Diagnosis not present

## 2019-03-02 DIAGNOSIS — Z1159 Encounter for screening for other viral diseases: Secondary | ICD-10-CM | POA: Diagnosis not present

## 2019-03-05 DIAGNOSIS — E785 Hyperlipidemia, unspecified: Secondary | ICD-10-CM | POA: Diagnosis not present

## 2019-03-05 DIAGNOSIS — D72819 Decreased white blood cell count, unspecified: Secondary | ICD-10-CM | POA: Diagnosis not present

## 2019-04-22 DIAGNOSIS — Z9189 Other specified personal risk factors, not elsewhere classified: Secondary | ICD-10-CM | POA: Diagnosis not present

## 2019-04-22 DIAGNOSIS — M8588 Other specified disorders of bone density and structure, other site: Secondary | ICD-10-CM | POA: Diagnosis not present

## 2019-04-22 DIAGNOSIS — Z13820 Encounter for screening for osteoporosis: Secondary | ICD-10-CM | POA: Diagnosis not present

## 2019-04-27 DIAGNOSIS — K228 Other specified diseases of esophagus: Secondary | ICD-10-CM | POA: Diagnosis not present

## 2019-04-27 DIAGNOSIS — K2930 Chronic superficial gastritis without bleeding: Secondary | ICD-10-CM | POA: Diagnosis not present

## 2019-04-27 DIAGNOSIS — K3189 Other diseases of stomach and duodenum: Secondary | ICD-10-CM | POA: Diagnosis not present

## 2019-04-27 DIAGNOSIS — R1310 Dysphagia, unspecified: Secondary | ICD-10-CM | POA: Diagnosis not present

## 2019-04-27 DIAGNOSIS — K259 Gastric ulcer, unspecified as acute or chronic, without hemorrhage or perforation: Secondary | ICD-10-CM | POA: Diagnosis not present

## 2019-04-27 DIAGNOSIS — B3781 Candidal esophagitis: Secondary | ICD-10-CM | POA: Diagnosis not present

## 2019-06-07 DIAGNOSIS — M62838 Other muscle spasm: Secondary | ICD-10-CM | POA: Diagnosis not present

## 2019-06-07 DIAGNOSIS — M542 Cervicalgia: Secondary | ICD-10-CM | POA: Diagnosis not present

## 2019-06-17 DIAGNOSIS — I2510 Atherosclerotic heart disease of native coronary artery without angina pectoris: Secondary | ICD-10-CM | POA: Diagnosis not present

## 2019-07-01 DIAGNOSIS — Z6825 Body mass index (BMI) 25.0-25.9, adult: Secondary | ICD-10-CM | POA: Diagnosis not present

## 2019-07-01 DIAGNOSIS — Z1231 Encounter for screening mammogram for malignant neoplasm of breast: Secondary | ICD-10-CM | POA: Diagnosis not present

## 2019-07-01 DIAGNOSIS — Z01419 Encounter for gynecological examination (general) (routine) without abnormal findings: Secondary | ICD-10-CM | POA: Diagnosis not present
